# Patient Record
Sex: Male | Born: 1955 | Race: Black or African American | Hispanic: No | Smoking: Never smoker
Health system: Southern US, Community
[De-identification: ages and names within clinical notes are randomized; demographics above are authoritative.]

## PROBLEM LIST (undated history)

## (undated) DIAGNOSIS — I2510 Atherosclerotic heart disease of native coronary artery without angina pectoris: Secondary | ICD-10-CM

## (undated) DIAGNOSIS — Z8739 Personal history of other diseases of the musculoskeletal system and connective tissue: Secondary | ICD-10-CM

## (undated) DIAGNOSIS — G4730 Sleep apnea, unspecified: Secondary | ICD-10-CM

## (undated) DIAGNOSIS — T7840XA Allergy, unspecified, initial encounter: Secondary | ICD-10-CM

## (undated) DIAGNOSIS — Z87442 Personal history of urinary calculi: Secondary | ICD-10-CM

## (undated) DIAGNOSIS — I10 Essential (primary) hypertension: Secondary | ICD-10-CM

## (undated) DIAGNOSIS — H9191 Unspecified hearing loss, right ear: Secondary | ICD-10-CM

## (undated) DIAGNOSIS — I499 Cardiac arrhythmia, unspecified: Secondary | ICD-10-CM

## (undated) DIAGNOSIS — Z9289 Personal history of other medical treatment: Secondary | ICD-10-CM

## (undated) DIAGNOSIS — T829XXA Unspecified complication of cardiac and vascular prosthetic device, implant and graft, initial encounter: Secondary | ICD-10-CM

## (undated) DIAGNOSIS — H409 Unspecified glaucoma: Secondary | ICD-10-CM

## (undated) DIAGNOSIS — E119 Type 2 diabetes mellitus without complications: Secondary | ICD-10-CM

## (undated) DIAGNOSIS — E785 Hyperlipidemia, unspecified: Secondary | ICD-10-CM

## (undated) DIAGNOSIS — K219 Gastro-esophageal reflux disease without esophagitis: Secondary | ICD-10-CM

## (undated) HISTORY — DX: Hyperlipidemia, unspecified: E78.5

## (undated) HISTORY — DX: Sleep apnea, unspecified: G47.30

## (undated) HISTORY — DX: Personal history of other diseases of the musculoskeletal system and connective tissue: Z87.39

## (undated) HISTORY — DX: Unspecified glaucoma: H40.9

## (undated) HISTORY — DX: Allergy, unspecified, initial encounter: T78.40XA

## (undated) HISTORY — DX: Essential (primary) hypertension: I10

## (undated) HISTORY — DX: Atherosclerotic heart disease of native coronary artery without angina pectoris: I25.10

## (undated) HISTORY — DX: Unspecified complication of cardiac and vascular prosthetic device, implant and graft, initial encounter: T82.9XXA

## (undated) HISTORY — DX: Unspecified hearing loss, right ear: H91.91

## (undated) HISTORY — DX: Type 2 diabetes mellitus without complications: E11.9

## (undated) HISTORY — DX: Personal history of other medical treatment: Z92.89

## (undated) HISTORY — PX: CARDIAC CATHETERIZATION: SHX172

---

## 2004-12-30 ENCOUNTER — Ambulatory Visit: Payer: Self-pay | Admitting: Family Medicine

## 2010-03-24 HISTORY — PX: CAROTID STENT INSERTION: SHX5766

## 2012-05-03 DIAGNOSIS — R079 Chest pain, unspecified: Secondary | ICD-10-CM | POA: Insufficient documentation

## 2012-05-05 DIAGNOSIS — Z955 Presence of coronary angioplasty implant and graft: Secondary | ICD-10-CM | POA: Insufficient documentation

## 2015-01-05 ENCOUNTER — Ambulatory Visit (INDEPENDENT_AMBULATORY_CARE_PROVIDER_SITE_OTHER): Payer: BC Managed Care – PPO | Admitting: Family Medicine

## 2015-01-05 ENCOUNTER — Encounter: Payer: Self-pay | Admitting: Family Medicine

## 2015-01-05 VITALS — BP 122/60 | HR 104 | Temp 99.4°F | Resp 18 | Ht 75.5 in | Wt 276.2 lb

## 2015-01-05 DIAGNOSIS — Z9989 Dependence on other enabling machines and devices: Secondary | ICD-10-CM

## 2015-01-05 DIAGNOSIS — G4733 Obstructive sleep apnea (adult) (pediatric): Secondary | ICD-10-CM | POA: Diagnosis not present

## 2015-01-05 DIAGNOSIS — E785 Hyperlipidemia, unspecified: Secondary | ICD-10-CM | POA: Diagnosis not present

## 2015-01-05 DIAGNOSIS — E669 Obesity, unspecified: Secondary | ICD-10-CM | POA: Insufficient documentation

## 2015-01-05 DIAGNOSIS — Z955 Presence of coronary angioplasty implant and graft: Secondary | ICD-10-CM | POA: Insufficient documentation

## 2015-01-05 DIAGNOSIS — R0981 Nasal congestion: Secondary | ICD-10-CM | POA: Insufficient documentation

## 2015-01-05 DIAGNOSIS — I2510 Atherosclerotic heart disease of native coronary artery without angina pectoris: Secondary | ICD-10-CM | POA: Diagnosis not present

## 2015-01-05 DIAGNOSIS — E8881 Metabolic syndrome: Secondary | ICD-10-CM

## 2015-01-05 DIAGNOSIS — M79671 Pain in right foot: Secondary | ICD-10-CM

## 2015-01-05 DIAGNOSIS — I10 Essential (primary) hypertension: Secondary | ICD-10-CM | POA: Insufficient documentation

## 2015-01-05 NOTE — Progress Notes (Signed)
Name: Dean Sullivan   MRN: [id number]    DOB: [birth year]   Date:01/05/2015       Progress Note  Subjective  Chief Complaint  Chief Complaint  Patient presents with  . Establish Care  . Referral    patient needs to see a podiatrist for his bone spur.  . Medication Management    patient wants to talk changing his cholesterol medication.    HPI  Dean Sullivan is a 59 y.o. male here today to transition care of medical needs to a primary care provider.    CAD: Several years ago was light headed and dizziness, was seen at a hospital and eventually found to have atherosclerosis of coronaries (vessel names unknown) resulting in 2 stents being placed. Was on dual anticoagulation for 1 year and now is on ASA 81 MG alone. Denies chest pain, palpitations, LE swelling. Needs new cardiologist.  OSA: Diagnosed many years ago and is using CPAP nightly. Wondering if his breathing problems are correlated to sinus congesion.  HLD: Due to CAD his LDL goal is 100, it was last checked a few months ago it was 130s so his Crestor was increased from 5 mg to 20 mg but this is causing myalgia.   Foot pain: Many years of foot pain, waxing and waning. Right foot heal and 1st MTP plantar surface. Has consulted with podiatrist in the past for 2nd toe pain (buddy taping helped) but now that he is retired he notes symptoms someone subsiding. He was told to wear metatarsal pad but never found one.   Nasal congestion, chronic: Many years ago an ENT told him his nasal passage was nearly blocked and he may need surgery at some point. He reports and claritin and flonase did not help with his chronic sinus and nasal symptoms.   Trigger finger: Location right hand, 1st digit, locking, has had injection therapy previously and is requesting Orthopedic consultation to address this again.    Past Medical History  Diagnosis Date  . Hyperlipidemia   . Hypertension   . History of trigger finger     bilateral, but mostly  in right thumb  . Blockage of coronary artery bypass graft     patient stated that he had blockages - stents were inserted.  Marland Kitchen Hearing loss of right ear     patient was told he had slight hearing loss  . Coronary arteriosclerosis in native artery   . Allergy   . Sleep apnea     Patient Active Problem List   Diagnosis Date Noted  . Hyperlipidemia LDL goal <70 01/05/2015  . Coronary artery disease involving native coronary artery of native heart without angina pectoris 01/05/2015  . Status post coronary artery stent placement 01/05/2015  . Hypertension goal BP (blood pressure) < 140/90 01/05/2015  . Obesity, Class I, BMI 30.0-34.9 (see actual BMI) 01/05/2015  . Abdominal obesity-metabolic syndrome type 3 ([facility]) 01/05/2015  . Nasal sinus congestion 01/05/2015  . Right foot pain 01/05/2015  . Obstructive sleep apnea on CPAP 01/05/2015    Social History  Substance Use Topics  . Smoking status: Never Smoker   . Smokeless tobacco: Not on file  . Alcohol Use: 0.0 oz/week    0 Standard drinks or equivalent per week     Comment: a glass of wine      Current outpatient prescriptions:  .  aspirin 81 MG tablet, Take 81 mg by mouth daily., Disp: , Rfl:  .  Cholecalciferol (VITAMIN D-3) 1000 UNITS CAPS,  Take by mouth 3 (three) times daily., Disp: , Rfl:  .  losartan (COZAAR) 25 MG tablet, Take 25 mg by mouth daily., Disp: , Rfl:  .  Magnesium 250 MG TABS, Take by mouth., Disp: , Rfl:  .  Probiotic Product (PROBIOTIC ACIDOPHILUS) CAPS, Take by mouth., Disp: , Rfl:  .  rosuvastatin (CRESTOR) 20 MG tablet, Take 20 mg by mouth daily., Disp: , Rfl:  .  triamcinolone cream (KENALOG) 0.1 %, Apply 1 application topically 2 (two) times daily., Disp: , Rfl:   Past Surgical History  Procedure Laterality Date  . Carotid stent insertion  2012    2    Family History  Problem Relation Age of Onset  . Diabetes Mother     adult onset - geriatric  . Alcohol abuse Father   . Cancer Father      prostate  . Stroke Father     mini strokes  . Other Maternal Aunt     born with her lung outside of her body; lived until 45.  . Arthritis Maternal Grandmother     No Known Allergies   Review of Systems  CONSTITUTIONAL: No significant weight changes, fever, chills, weakness or fatigue.  HEENT:  - Eyes: No visual changes.  - Ears: No auditory changes. No pain.  - Nose: No sneezing, congestion, runny nose. - Throat: No sore throat. No changes in swallowing. SKIN: No rash or itching.  CARDIOVASCULAR: No chest pain, chest pressure or chest discomfort. No palpitations or edema.  RESPIRATORY: No shortness of breath, cough or sputum.  GASTROINTESTINAL: No anorexia, nausea, vomiting. No changes in bowel habits. No abdominal pain or blood.  GENITOURINARY: No dysuria. No frequency. No discharge.  NEUROLOGICAL: No headache, dizziness, syncope, paralysis, ataxia, numbness or tingling in the extremities. No memory changes. No change in bowel or bladder control.  MUSCULOSKELETAL: Yes joint pain. No muscle pain. HEMATOLOGIC: No anemia, bleeding or bruising.  LYMPHATICS: No enlarged lymph nodes.  PSYCHIATRIC: No change in mood. No change in sleep pattern.  ENDOCRINOLOGIC: No reports of sweating, cold or heat intolerance. No polyuria or polydipsia.     Objective  BP 122/60 mmHg  Pulse 104  Temp(Src) 99.4 F (37.4 C) (Oral)  Resp 18  Ht 6' 3.5" (1.918 m)  Wt 276 lb 3.2 oz (125.283 kg)  BMI 34.06 kg/m2  SpO2 97% Body mass index is 34.06 kg/(m^2).  Physical Exam  Constitutional: Patient is obese and well-nourished. In no distress.  HEENT:  - Head: Normocephalic and atraumatic.  - Ears: Bilateral TMs gray, no erythema or effusion - Nose: Nasal mucosa moist, boggy turbinates with narrow passage.  - Mouth/Throat: Oropharynx is clear and moist. No tonsillar hypertrophy or erythema. No post nasal drainage.  - Eyes: Conjunctivae clear, EOM movements normal. PERRLA. No scleral icterus.   Neck: Normal range of motion. Neck supple. No JVD present. No thyromegaly present.  Cardiovascular: Normal rate, regular rhythm and normal heart sounds.  No murmur heard.  Pulmonary/Chest: Effort normal and breath sounds normal. No respiratory distress. Abdominal circumference greater than 40 inches.  Musculoskeletal: Normal range of motion bilateral UE and LE, no joint effusions. Right foot callus over plantar MTP of 1st digit.  Peripheral vascular: Bilateral LE no edema. Neurological: CN II-XII grossly intact with no focal deficits. Alert and oriented to person, place, and time. Coordination, balance, strength, speech and gait are normal.  Skin: Skin is warm and dry. No rash noted. No erythema.  Psychiatric: Patient has a normal mood and  affect. Behavior is normal in office today. Judgment and thought content normal in office today.   Assessment & Plan  1. Hyperlipidemia LDL goal <70 Plan to use 1/2 tablet of Crestor 20 mg a day and if this helps resolve his myalgia call me to change his RX to Crestor 10 mg a day. Otherwise we will return to 5 mg a day and work on lifestyle changes more aggressively.   2. Coronary artery disease involving native coronary artery of native heart without angina pectoris Medication and lifestyle optimization.  - Ambulatory referral to Cardiology  3. Status post coronary artery stent placement  - Ambulatory referral to Cardiology  4. Obesity, Class I, BMI 30.0-34.9 (see actual BMI) The patient has been counseled on their higher than normal BMI.  They have verbally expressed understanding their increased risk for other diseases.  In efforts to meet a better target BMI goal the patient has been counseled on lifestyle, diet and exercise modification tactics. Start with moderate intensity aerobic exercise (walking, jogging, elliptical, swimming, group or individual sports, hiking) at least 77mins a day at least 4 days a week and increase intensity, duration,  frequency as tolerated. Diet should include well balance fresh fruits and vegetables avoiding processed foods, carbohydrates and sugars. Drink at least 8oz 10 glasses a day avoiding sodas, sugary fruit drinks, sweetened tea. Check weight on a reliable scale daily and monitor weight loss progress daily. Consider investing in mobile phone apps that will help keep track of weight loss goals.   5. Abdominal obesity-metabolic syndrome type 3 (HCC) Increased risk for cardiovascular and cerebral events.    6. Nasal sinus congestion Start Allegra daily.  - Ambulatory referral to ENT  7. Right foot pain Get metatarsal pad.  8. Obstructive sleep apnea on CPAP Continue CPAP machine.

## 2015-02-12 ENCOUNTER — Encounter: Payer: Self-pay | Admitting: Family Medicine

## 2015-02-12 ENCOUNTER — Ambulatory Visit
Admission: RE | Admit: 2015-02-12 | Discharge: 2015-02-12 | Disposition: A | Discharge status: Home / Self Care | Payer: BC Managed Care – PPO | Source: Ambulatory Visit | Attending: Family Medicine | Admitting: Family Medicine

## 2015-02-12 ENCOUNTER — Ambulatory Visit (INDEPENDENT_AMBULATORY_CARE_PROVIDER_SITE_OTHER): Payer: BC Managed Care – PPO | Admitting: Family Medicine

## 2015-02-12 VITALS — BP 118/70 | HR 100 | Temp 99.3°F | Resp 17 | Ht 76.0 in | Wt 277.8 lb

## 2015-02-12 DIAGNOSIS — R0789 Other chest pain: Secondary | ICD-10-CM

## 2015-02-12 DIAGNOSIS — R0989 Other specified symptoms and signs involving the circulatory and respiratory systems: Secondary | ICD-10-CM | POA: Insufficient documentation

## 2015-02-12 DIAGNOSIS — J020 Streptococcal pharyngitis: Secondary | ICD-10-CM

## 2015-02-12 DIAGNOSIS — J0110 Acute frontal sinusitis, unspecified: Secondary | ICD-10-CM | POA: Insufficient documentation

## 2015-02-12 LAB — POCT RAPID STREP A (OFFICE): Rapid Strep A Screen: POSITIVE — AB

## 2015-02-12 MED ORDER — AMOXICILLIN-POT CLAVULANATE 875-125 MG PO TABS: 1.0000 | ORAL_TABLET | Freq: Two times a day (BID) | ORAL | Status: DC

## 2015-02-12 NOTE — Progress Notes (Signed)
Name: Dean Sullivan   MRN: [id number]    DOB: [birth year]   Date:02/12/2015       Progress Note  Subjective  Chief Complaint  Chief Complaint  Patient presents with  . Acute Visit    tightness in throat and pressure in stomach x2 weeks    HPI  Pt. Is here complains of fatigue, throat congestion, and bloating for over 2 weeks. Symptoms persistent, seen by ENT and was given Afrin for nasal sinus congestion. He reports that for the past 2 weeks, he has noticed a multitude of GI and cardiopulmonary symptoms. He feels pressure in his chest and throat, gets 'winded' easily, has to constantly clear his throat, a sore throat, and has been running a low grade fever ( normal temp is 97.4 and todayy its 99.3).   Past Medical History  Diagnosis Date  . Hyperlipidemia   . Hypertension   . History of trigger finger     bilateral, but mostly in right thumb  . Blockage of coronary artery bypass graft     patient stated that he had blockages - stents were inserted.  Marland Kitchen Hearing loss of right ear     patient was told he had slight hearing loss  . Coronary arteriosclerosis in native artery   . Allergy   . Sleep apnea     Past Surgical History  Procedure Laterality Date  . Carotid stent insertion  2012    2    Family History  Problem Relation Age of Onset  . Diabetes Mother     adult onset - geriatric  . Alcohol abuse Father   . Cancer Father     prostate  . Stroke Father     mini strokes  . Other Maternal Aunt     born with her lung outside of her body; lived until 76.  . Arthritis Maternal Grandmother     Social History   Social History  . Marital Status: Unknown    Spouse Name: N/A  . Number of Children: N/A  . Years of Education: N/A   Occupational History  . Not on file.   Social History Main Topics  . Smoking status: Never Smoker   . Smokeless tobacco: Not on file  . Alcohol Use: 0.0 oz/week    0 Standard drinks or equivalent per week     Comment: a glass of wine    . Drug Use: No  . Sexual Activity:    Partners: Female   Other Topics Concern  . Not on file   Social History Narrative     Current outpatient prescriptions:  .  aspirin 81 MG tablet, Take 81 mg by mouth daily., Disp: , Rfl:  .  Cholecalciferol (VITAMIN D-3) 1000 UNITS CAPS, Take by mouth 3 (three) times daily., Disp: , Rfl:  .  losartan (COZAAR) 25 MG tablet, Take 25 mg by mouth daily., Disp: , Rfl:  .  Magnesium 250 MG TABS, Take by mouth., Disp: , Rfl:  .  Probiotic Product (PROBIOTIC ACIDOPHILUS) CAPS, Take by mouth., Disp: , Rfl:  .  rosuvastatin (CRESTOR) 20 MG tablet, Take 20 mg by mouth daily., Disp: , Rfl:  .  triamcinolone cream (KENALOG) 0.1 %, Apply 1 application topically 2 (two) times daily., Disp: , Rfl:   No Known Allergies   Review of Systems  Constitutional: Positive for fever, chills and malaise/fatigue.  HENT: Positive for sore throat (dryness in throat). Negative for congestion and ear pain.   Respiratory: Positive for  sputum production. Negative for cough, shortness of breath and wheezing.   Cardiovascular: Negative for chest pain (chest tightness/pressure) and palpitations.  Neurological: Negative for headaches.    Objective  Filed Vitals:   02/12/15 1452  BP: 118/70  Pulse: 100  Temp: 99.3 F (37.4 C)  TempSrc: Oral  Resp: 17  Height: 6\' 4"  (1.93 m)  Weight: 277 lb 12.8 oz (126.009 kg)  SpO2: 99%    Physical Exam  Constitutional: He is oriented to person, place, and time and well-developed, well-nourished, and in no distress.  HENT:  Right Ear: Tympanic membrane and ear canal normal.  Left Ear: Tympanic membrane and ear canal normal.  Nose: No mucosal edema. Right sinus exhibits frontal sinus tenderness. Right sinus exhibits no maxillary sinus tenderness. Left sinus exhibits frontal sinus tenderness. Left sinus exhibits no maxillary sinus tenderness.  Mouth/Throat: Oropharynx is clear and moist and mucous membranes are normal. No posterior  oropharyngeal erythema.  Cardiovascular: Normal rate, regular rhythm and normal heart sounds.   No murmur heard. Pulmonary/Chest: Effort normal and breath sounds normal. He has no wheezes.  Abdominal: Soft. Bowel sounds are normal. There is no tenderness.  Neurological: He is alert and oriented to person, place, and time.  Nursing note and vitals reviewed.   Assessment & Plan  1. Chest tightness or pressure Pain workup for chest tightness and pressure. EKG is normal sinus rhythm, no ST-T changes. Reviewed with patient. We will follow up on lab work and chest x-ray. - CBC with Differential - Comprehensive Metabolic Panel (CMET) - DG Chest 2 View; Future - EKG 12-Lead  2. Pharyngitis, streptococcal, acute Start on amoxicillin/clavulanate for treatment of streptococcal infection. Follow up with PCP. - amoxicillin-clavulanate (AUGMENTIN) 875-125 MG tablet; Take 1 tablet by mouth 2 (two) times daily.  Dispense: 20 tablet; Refill: 0 - POCT rapid strep A   Damein Gaunce [facility] 02/12/2015 3:20 PM

## 2015-02-13 ENCOUNTER — Telehealth: Payer: Self-pay | Admitting: Family Medicine

## 2015-02-13 DIAGNOSIS — J020 Streptococcal pharyngitis: Secondary | ICD-10-CM

## 2015-02-13 LAB — COMPREHENSIVE METABOLIC PANEL
A/G RATIO: 1.8 (ref 1.1–2.5)
ALBUMIN: 4.9 g/dL (ref 3.5–5.5)
ALT: 20 IU/L (ref 0–44)
AST: 17 IU/L (ref 0–40)
Alkaline Phosphatase: 96 IU/L (ref 39–117)
BUN / CREAT RATIO: 14 (ref 9–20)
BUN: 13 mg/dL (ref 6–24)
Bilirubin Total: 0.9 mg/dL (ref 0.0–1.2)
CALCIUM: 9.8 mg/dL (ref 8.7–10.2)
CO2: 23 mmol/L (ref 18–29)
Chloride: 100 mmol/L (ref 97–106)
Creatinine, Ser: 0.96 mg/dL (ref 0.76–1.27)
GFR, EST AFRICAN AMERICAN: 100 mL/min/{1.73_m2} (ref >59)
GFR, EST NON AFRICAN AMERICAN: 87 mL/min/{1.73_m2} (ref >59)
Globulin, Total: 2.7 g/dL (ref 1.5–4.5)
Glucose: 91 mg/dL (ref 65–99)
POTASSIUM: 4 mmol/L (ref 3.5–5.2)
Sodium: 140 mmol/L (ref 136–144)
TOTAL PROTEIN: 7.6 g/dL (ref 6.0–8.5)

## 2015-02-13 LAB — CBC WITH DIFFERENTIAL/PLATELET
BASOS: 0 %
Basophils Absolute: 0 10*3/uL (ref 0.0–0.2)
EOS (ABSOLUTE): 0.2 10*3/uL (ref 0.0–0.4)
EOS: 3 %
HEMATOCRIT: 38.6 % (ref 37.5–51.0)
HEMOGLOBIN: 13 g/dL (ref 12.6–17.7)
IMMATURE GRANS (ABS): 0 10*3/uL (ref 0.0–0.1)
IMMATURE GRANULOCYTES: 0 %
LYMPHS: 49 %
Lymphocytes Absolute: 3 10*3/uL (ref 0.7–3.1)
MCH: 27.3 pg (ref 26.6–33.0)
MCHC: 33.7 g/dL (ref 31.5–35.7)
MCV: 81 fL (ref 79–97)
MONOCYTES: 8 %
Monocytes Absolute: 0.5 10*3/uL (ref 0.1–0.9)
NEUTROS ABS: 2.4 10*3/uL (ref 1.4–7.0)
NEUTROS PCT: 40 %
PLATELETS: 258 10*3/uL (ref 150–379)
RBC: 4.76 x10E6/uL (ref 4.14–5.80)
RDW: 15 % (ref 12.3–15.4)
WBC: 6.1 10*3/uL (ref 3.4–10.8)

## 2015-02-13 MED ORDER — AMOXICILLIN-POT CLAVULANATE 875-125 MG PO TABS: 1.0000 | ORAL_TABLET | Freq: Two times a day (BID) | ORAL | Status: DC

## 2015-02-13 NOTE — Telephone Encounter (Signed)
Was seen yesterday and was prescribed an antibotic. The medication will cost him $54.00 would like to know if there is something less expensive that you could call in that is equivalent to the medication prescribed? Or a generic form

## 2015-02-13 NOTE — Telephone Encounter (Signed)
Generic amoxicillin-clavulanate 875-125 mg 1 by mouth twice a day #20 sent to pharmacy

## 2015-02-13 NOTE — Telephone Encounter (Signed)
Routed to Dr. Manuella Ghazi for medication advice

## 2015-02-19 ENCOUNTER — Telehealth: Payer: Self-pay | Admitting: Family Medicine

## 2015-02-19 NOTE — Telephone Encounter (Signed)
Pt needs to get referral done for his Trigger Point. Pt states his symptoms are worsening

## 2015-02-20 ENCOUNTER — Other Ambulatory Visit: Payer: Self-pay | Admitting: Family Medicine

## 2015-02-20 DIAGNOSIS — M6530 Trigger finger, unspecified finger: Secondary | ICD-10-CM

## 2015-02-20 NOTE — Telephone Encounter (Signed)
Referral to [facility] placed.

## 2015-02-20 NOTE — Telephone Encounter (Signed)
Pt informed of referral and will will for call.

## 2015-03-05 ENCOUNTER — Ambulatory Visit (INDEPENDENT_AMBULATORY_CARE_PROVIDER_SITE_OTHER): Payer: BC Managed Care – PPO | Admitting: Cardiovascular Disease

## 2015-03-05 ENCOUNTER — Encounter: Payer: Self-pay | Admitting: Cardiovascular Disease

## 2015-03-05 VITALS — BP 130/80 | HR 73 | Ht 76.0 in | Wt 278.5 lb

## 2015-03-05 DIAGNOSIS — E785 Hyperlipidemia, unspecified: Secondary | ICD-10-CM

## 2015-03-05 DIAGNOSIS — Z79899 Other long term (current) drug therapy: Secondary | ICD-10-CM | POA: Diagnosis not present

## 2015-03-05 DIAGNOSIS — R0789 Other chest pain: Secondary | ICD-10-CM | POA: Diagnosis not present

## 2015-03-05 DIAGNOSIS — E8881 Metabolic syndrome: Secondary | ICD-10-CM

## 2015-03-05 DIAGNOSIS — I10 Essential (primary) hypertension: Secondary | ICD-10-CM

## 2015-03-05 DIAGNOSIS — E669 Obesity, unspecified: Secondary | ICD-10-CM

## 2015-03-05 DIAGNOSIS — Z9989 Dependence on other enabling machines and devices: Secondary | ICD-10-CM

## 2015-03-05 DIAGNOSIS — I2510 Atherosclerotic heart disease of native coronary artery without angina pectoris: Secondary | ICD-10-CM | POA: Diagnosis not present

## 2015-03-05 DIAGNOSIS — G4733 Obstructive sleep apnea (adult) (pediatric): Secondary | ICD-10-CM

## 2015-03-05 DIAGNOSIS — J020 Streptococcal pharyngitis: Secondary | ICD-10-CM

## 2015-03-05 MED ORDER — EZETIMIBE 10 MG PO TABS: 10.0000 mg | ORAL_TABLET | Freq: Every day | ORAL | Status: DC

## 2015-03-05 NOTE — Assessment & Plan Note (Signed)
He reports that he is compliant with his CPAP. He may need to establish with pulmonary sleep physician

## 2015-03-05 NOTE — Assessment & Plan Note (Signed)
Fullness in his chest and shortness of breath is improving as strep. Pharyngitis is complete. Recommended we hold off on any testing at this time but that he call us if symptoms do not improve back to his baseline. Currently he feels he is doing better

## 2015-03-05 NOTE — Assessment & Plan Note (Signed)
Symptoms improved as he has held his probiotics which caused significant bloating. Also after treatment of his strep pharyngitis Encouraged him to call us if symptoms recur

## 2015-03-05 NOTE — Assessment & Plan Note (Signed)
Blood pressure is well controlled on today's visit. No changes made to the medications. 

## 2015-03-05 NOTE — Assessment & Plan Note (Signed)
We have encouraged continued exercise, careful diet management in an effort to lose weight. 

## 2015-03-05 NOTE — Assessment & Plan Note (Signed)
Goal LDL less than 70 Most recent LDL 130 on low-dose Crestor, we'll repeat lipids today on Crestor 10 mg daily Suspect we'll need to add zetia. Even then do not think he will reach goal, likely will need praluent or repatha sq injectio

## 2015-03-05 NOTE — Patient Instructions (Addendum)
You are doing well.  Goal total cholesterol is <150 Goal LDL is <70  We will check cholesterol today  We will send in zetia one a day for choelsterol  Please call us if you have new issues that need to be addressed before your next appt.  Your physician wants you to follow-up in: 6 months.  You will receive a reminder letter in the mail two months in advance. If you don't receive a letter, please call our office to schedule the follow-up appointment.

## 2015-03-05 NOTE — Assessment & Plan Note (Signed)
Symptoms improved on Augmentin Still with some fatigue

## 2015-03-05 NOTE — Progress Notes (Signed)
Patient ID: Dean Sullivan, male    DOB: September 30, 1955, 59 y.o.   MRN: [id number]  HPI Comments: Dean Sullivan is a pleasant 59 year old gentleman with history of coronary artery disease, stent 2 in the past, hyperlipidemia, obstructive sleep apnea on CPAP, presenting for symptoms of chest discomfort.   Recently seen your no stroke for sinus congestion, reports he had a treatment/spray, open up everything in his posterior pharynx Shortly after, possibly 3-4 days, started developing sore throat, chest fullness seenby primary care, diagnosed with strep pharyngitis, treated with Augmentin Symptoms have dramatically improved Felt bloated in the abdomen, radiating up into his chest at nighttime when he lays down Stopped his probiotics and symptoms seem to be getting better Still with mild fatigue, stopped antibiotic last week. Slowly feeling better Mild shortness of breath on exertion  Notes indicate LDL 130 on Crestor 5 mg daily Increased up to 20 mg daily but had muscle ache, decreased back to 10 mg daily which she is tolerating Previously was on WelChol but he had side effects  Reports having 2 stents placed either in 2012 or 2013 at hospital in [place]. Results not available but have been requested. Otherwise active, no regular exercise program, weight has been trending upwards and retirement  EKG reviewed from primary care showing normal sinus rhythm with no significant ST or T-wave changes  No Known Allergies  Current Outpatient Prescriptions on File Prior to Visit  Medication Sig Dispense Refill  . aspirin 81 MG tablet Take 81 mg by mouth daily.    Marland Kitchen losartan (COZAAR) 25 MG tablet Take 25 mg by mouth daily.    . Magnesium 250 MG TABS Take by mouth.    . rosuvastatin (CRESTOR) 20 MG tablet Take 10 mg by mouth daily.     Marland Kitchen triamcinolone cream (KENALOG) 0.1 % Apply 1 application topically 2 (two) times daily.     No current facility-administered medications on file prior to visit.     Past Medical History  Diagnosis Date  . Hyperlipidemia   . Hypertension   . History of trigger finger     bilateral, but mostly in right thumb  . Blockage of coronary artery bypass graft     patient stated that he had blockages - stents were inserted.  Marland Kitchen Hearing loss of right ear     patient was told he had slight hearing loss  . Coronary arteriosclerosis in native artery   . Allergy   . Sleep apnea     Past Surgical History  Procedure Laterality Date  . Cardiac catheterization      x2 stents [place] History  reports that he has never smoked. He does not have any smokeless tobacco history on file. He reports that he drinks alcohol. He reports that he does not use illicit drugs.  Family History family history includes Alcohol abuse in his father; Arthritis in his maternal grandmother; Cancer in his father; Diabetes in his mother; Heart Problems in his father; Other in his maternal aunt; Stroke in his father.   Review of Systems  Constitutional: Negative.   Respiratory: Positive for shortness of breath.   Cardiovascular: Negative.   Gastrointestinal: Negative.   Musculoskeletal: Negative.   Neurological: Negative.   Hematological: Negative.   Psychiatric/Behavioral: Negative.   All other systems reviewed and are negative.  BP 130/80 mmHg  Pulse 73  Ht 6\' 4"  (1.93 m)  Wt 278 lb 8 oz (126.327 kg)  BMI 33.91 kg/m2  Physical Exam  Constitutional: He is oriented to person, place, and time. He appears well-developed and well-nourished.  obese  HENT:  Head: Normocephalic.  Nose: Nose normal.  Mouth/Throat: Oropharynx is clear and moist.  Eyes: Conjunctivae are normal. Pupils are equal, round, and reactive to light.  Neck: Normal range of motion. Neck supple. No JVD present.  Cardiovascular: Normal rate, regular rhythm, normal heart sounds and intact distal pulses.  Exam reveals no gallop and no friction rub.   No murmur  heard. Pulmonary/Chest: Effort normal and breath sounds normal. No respiratory distress. He has no wheezes. He has no rales. He exhibits no tenderness.  Abdominal: Soft. Bowel sounds are normal. He exhibits no distension. There is no tenderness.  Musculoskeletal: Normal range of motion. He exhibits no edema or tenderness.  Lymphadenopathy:    He has no cervical adenopathy.  Neurological: He is alert and oriented to person, place, and time. Coordination normal.  Skin: Skin is warm and dry. No rash noted. No erythema.  Psychiatric: He has a normal mood and affect. His behavior is normal. Judgment and thought content normal.

## 2015-03-06 LAB — LIPID PANEL
CHOLESTEROL TOTAL: 158 mg/dL (ref 100–199)
Chol/HDL Ratio: 4.5 ratio units (ref 0.0–5.0)
HDL: 35 mg/dL — ABNORMAL LOW (ref >39)
LDL CALC: 95 mg/dL (ref 0–99)
TRIGLYCERIDES: 138 mg/dL (ref 0–149)
VLDL Cholesterol Cal: 28 mg/dL (ref 5–40)

## 2015-04-24 ENCOUNTER — Ambulatory Visit
Admission: EM | Admit: 2015-04-24 | Discharge: 2015-04-24 | Disposition: A | Discharge status: Home / Self Care | Payer: BC Managed Care – PPO | Attending: Family Medicine | Admitting: Family Medicine

## 2015-04-24 ENCOUNTER — Encounter: Payer: Self-pay | Admitting: Emergency Medicine

## 2015-04-24 DIAGNOSIS — R0981 Nasal congestion: Secondary | ICD-10-CM | POA: Diagnosis not present

## 2015-04-24 DIAGNOSIS — J020 Streptococcal pharyngitis: Secondary | ICD-10-CM

## 2015-04-24 DIAGNOSIS — R040 Epistaxis: Secondary | ICD-10-CM | POA: Diagnosis not present

## 2015-04-24 MED ORDER — MONTELUKAST SODIUM 10 MG PO TABS: 10.0000 mg | ORAL_TABLET | Freq: Every day | ORAL | Status: DC

## 2015-04-24 MED ORDER — LORATADINE-PSEUDOEPHEDRINE ER 10-240 MG PO TB24: 1.0000 | ORAL_TABLET | Freq: Every day | ORAL | Status: DC

## 2015-04-24 NOTE — ED Notes (Signed)
Pt presents with nose bleed this am when blowing his nose. Denies any other sx at this time. No bleeding noted at this time.

## 2015-04-24 NOTE — Discharge Instructions (Signed)
Nosebleed  Nosebleeds are common. A nosebleed can be caused by many things, including:  · Getting hit hard in the nose.  · Infections.  · Dryness in your nose.  · A dry climate.  · Medicines.  · Picking your nose.  · Your home heating and cooling systems.  HOME CARE   · Try controlling your nosebleed by pinching your nostrils gently. Do this for at least 10 minutes.  · Avoid blowing or sniffing your nose for a number of hours after having a nosebleed.  · Do not put gauze inside of your nose yourself. If your nose was packed by your doctor, try to keep the pack inside of your nose until your doctor removes it.    If a gauze pack was used and it starts to fall out, gently replace it or cut off the end of it.    If a balloon catheter was used to pack your nose, do not cut or remove it unless told by your doctor.  · Avoid lying down while you are having a nosebleed. Sit up and lean forward.  · Use a nasal spray decongestant to help with a nosebleed as told by your doctor.  · Do not use petroleum jelly or mineral oil in your nose. These can drip into your lungs.  · Keep your house humid by using:    Less air conditioning.    A humidifier.  · Aspirin and blood thinners make bleeding more likely. If you are prescribed these medicines and you have nosebleeds, ask your doctor if you should stop taking the medicines or adjust the dose. Do not stop medicines unless told by your doctor.  · Resume your normal activities as you are able. Avoid straining, lifting, or bending at your waist for several days.  · If your nosebleed was caused by dryness in your nose, use over-the-counter saline nasal spray or gel. If you must use a lubricant:    Choose one that is water-soluble.    Use it only as needed.    Do not use it within several hours of lying down.  · Keep all follow-up visits as told by your doctor. This is important.  GET HELP IF:  · You have a fever.  · You get frequent nosebleeds.  · You are getting nosebleeds more  often.  GET HELP RIGHT AWAY IF:  · Your nosebleed lasts longer than 20 minutes.  · Your nosebleed occurs after an injury to your face, and your nose looks crooked or broken.  · You have unusual bleeding from other parts of your body.  · You have unusual bruising on other parts of your body.  · You feel light-headed or dizzy.  · You become sweaty.  · You throw up (vomit) blood.  · You have a nosebleed after a head injury.     This information is not intended to replace advice given to you by your health care provider. Make sure you discuss any questions you have with your health care provider.     Document Released: 12/18/2007 Document Revised: 03/31/2014 Document Reviewed: 10/24/2013  [employer]vier Interactive Patient Education ©2016 [employer]

## 2015-04-24 NOTE — ED Provider Notes (Signed)
CSN: [id number]     Arrival date & time 04/24/15  1227 History   First MD Initiated Contact with Patient 04/24/15 1255    Nurses notes were reviewed. Chief Complaint  Patient presents with  . Epistaxis   Patient is retired African-American male who states that he has history of chronic nasal congestion but this morning he had episodes some mild bleeding from his right nostril min bleeding from his left nostril. No easy have nosebleeds and we recurred and became concerned and came in to be seen and evaluated.  History of strep throat about about a month ago history hypertension hyperlipidemia and history of angina  Family history mother with diabetes and father cancer and alcohol abuse  Patient never smoked states he was told that he had a deviated septum opted not to have surgery because the cost. He has a CPAP machine uses that E pot and uses either Allegra or Claritin on a regular basis.    (Consider location/radiation/quality/duration/timing/severity/associated sxs/prior Treatment) HPI  Past Medical History  Diagnosis Date  . Hyperlipidemia   . Hypertension   . History of trigger finger     bilateral, but mostly in right thumb  . Blockage of coronary artery bypass graft     patient stated that he had blockages - stents were inserted.  Marland Kitchen Hearing loss of right ear     patient was told he had slight hearing loss  . Coronary arteriosclerosis in native artery   . Allergy   . Sleep apnea    Past Surgical History  Procedure Laterality Date  . Carotid stent insertion  2012    2  . Cardiac catheterization      x2 stents [facility]   Family History  Problem Relation Age of Onset  . Diabetes Mother     adult onset - geriatric  . Alcohol abuse Father   . Cancer Father     prostate  . Stroke Father     mini strokes  . Heart Problems Father   . Other Maternal Aunt     born with her lung outside of her body; lived until 69.  . Arthritis Maternal Grandmother     Social History  Substance Use Topics  . Smoking status: Never Smoker   . Smokeless tobacco: None  . Alcohol Use: 0.0 oz/week    0 Standard drinks or equivalent per week     Comment: a glass of wine     Review of Systems  Allergies  Review of patient's allergies indicates no known allergies.  Home Medications   Prior to Admission medications   Medication Sig Start Date End Date Taking? Authorizing Provider  aspirin 81 MG tablet Take 81 mg by mouth daily.    Historical Provider, MD  Cholecalciferol (VITAMIN D) 2000 UNITS tablet Take 4,000 Units by mouth daily.    Historical Provider, MD  ezetimibe (ZETIA) 10 MG tablet Take 1 tablet (10 mg total) by mouth daily. 03/05/15   Minna Merritts, MD  loratadine-pseudoephedrine (CLARITIN-D 24 HOUR) 10-240 MG 24 hr tablet Take 1 tablet by mouth daily. Not take with Allegra or Claritin plain 04/24/15   Frederich Cha, MD  losartan (COZAAR) 25 MG tablet Take 25 mg by mouth daily.    Historical Provider, MD  Magnesium 250 MG TABS Take by mouth.    Historical Provider, MD  montelukast (SINGULAIR) 10 MG tablet Take 1 tablet (10 mg total) by mouth at bedtime. 04/24/15   Frederich Cha, MD  Omeprazole (  PRILOSEC PO) Take by mouth daily.    Historical Provider, MD  rosuvastatin (CRESTOR) 20 MG tablet Take 10 mg by mouth daily.     Historical Provider, MD  triamcinolone cream (KENALOG) 0.1 % Apply 1 application topically 2 (two) times daily.    Historical Provider, MD   Meds Ordered and Administered this Visit  Medications - No data to display  BP 144/91 mmHg  Pulse 77  Temp(Src) 98.8 F (37.1 C) (Tympanic)  Resp 20  Ht 6\' 3"  (1.905 m)  Wt 277 lb (125.646 kg)  BMI 34.62 kg/m2  SpO2 100% No data found.   Physical Exam  Constitutional: He is oriented to person, place, and time. He appears well-developed and well-nourished. No distress.  HENT:  Head: Normocephalic and atraumatic.  Right Ear: Hearing, tympanic membrane, external ear and ear canal  normal.  Left Ear: Hearing, tympanic membrane, external ear and ear canal normal.  Nose: Epistaxis is observed. Right sinus exhibits no maxillary sinus tenderness. Left sinus exhibits no maxillary sinus tenderness.  Mouth/Throat: Uvula is midline, oropharynx is clear and moist and mucous membranes are normal.  Patient has multiple areas of bleeding on the left nostril and left nasal mucosa oriented A1 to areas of the right.  Eyes: Pupils are equal, round, and reactive to light.  Neck: Normal range of motion. Neck supple.  Musculoskeletal: Normal range of motion.  Neurological: He is alert and oriented to person, place, and time.  Skin: Skin is warm and dry. He is not diaphoretic. No erythema.  Nursing note and vitals reviewed.   ED Course  .Epistaxis Management Date/Time: 04/24/2015 1:48 PM Performed by: Frederich Cha Authorized by: Frederich Cha Consent: Verbal consent obtained. Patient sedated: no Treatment site: right anterior and left anterior Repair method: silver nitrate Post-procedure assessment: bleeding stopped Patient tolerance: Patient tolerated the procedure well with no immediate complications Comments: Patient tolerated well the use of silver nitrate to stop the bleeding both nostrils several passes were made in the left nostril 0.1 needed in the right nostril   (including critical care time)  Labs Review Labs Reviewed - No data to display  Imaging Review No results found.   Visual Acuity Review  Right Eye Distance:   Left Eye Distance:   Bilateral Distance:    Right Eye Near:   Left Eye Near:    Bilateral Near:         MDM   1. Acute anterior epistaxis   2. Chronic nasal congestion    Patient will be given Claritin-D 24 hours 1 tablet daily recommend that over-the-counter Claritin and Allegra plain. We'll place him on Singulair 10 mg 1 tablet daily. I recommend they continue using his steroid nasal sprays of his choice. He is not using it though the  recommend way of chin on chest and alternating hand and nose sprays i.e. he is right-hand spray left nostril left hand spread right nostril and putting chin on his chest. Follow-up PCP if not better the patient is doing well time discharge  Note: This dictation was prepared with Dragon dictation along with smaller phrase technology. Any transcriptional errors that result from this process are unintentional.   Frederich Cha, MD 04/24/15 1352

## 2015-06-04 ENCOUNTER — Other Ambulatory Visit: Payer: Self-pay

## 2015-06-04 MED ORDER — ROSUVASTATIN CALCIUM 20 MG PO TABS: 10.0000 mg | ORAL_TABLET | Freq: Every day | ORAL | Status: DC

## 2015-09-06 ENCOUNTER — Ambulatory Visit (INDEPENDENT_AMBULATORY_CARE_PROVIDER_SITE_OTHER): Payer: BC Managed Care – PPO | Admitting: Cardiovascular Disease

## 2015-09-06 ENCOUNTER — Encounter: Payer: Self-pay | Admitting: Cardiovascular Disease

## 2015-09-06 VITALS — BP 140/86 | HR 79 | Ht 75.0 in | Wt 278.1 lb

## 2015-09-06 DIAGNOSIS — Z9989 Dependence on other enabling machines and devices: Secondary | ICD-10-CM

## 2015-09-06 DIAGNOSIS — E669 Obesity, unspecified: Secondary | ICD-10-CM

## 2015-09-06 DIAGNOSIS — E785 Hyperlipidemia, unspecified: Secondary | ICD-10-CM

## 2015-09-06 DIAGNOSIS — E66811 Obesity, class 1: Secondary | ICD-10-CM

## 2015-09-06 DIAGNOSIS — I2510 Atherosclerotic heart disease of native coronary artery without angina pectoris: Secondary | ICD-10-CM | POA: Diagnosis not present

## 2015-09-06 DIAGNOSIS — G4733 Obstructive sleep apnea (adult) (pediatric): Secondary | ICD-10-CM

## 2015-09-06 DIAGNOSIS — Z955 Presence of coronary angioplasty implant and graft: Secondary | ICD-10-CM | POA: Diagnosis not present

## 2015-09-06 DIAGNOSIS — I10 Essential (primary) hypertension: Secondary | ICD-10-CM | POA: Diagnosis not present

## 2015-09-06 MED ORDER — EZETIMIBE 10 MG PO TABS: 10.0000 mg | ORAL_TABLET | Freq: Every day | ORAL | Status: DC

## 2015-09-06 NOTE — Progress Notes (Signed)
Patient ID: Dean Sullivan, male   DOB: 01-Sep-1955, 60 y.o.   MRN: [id number] Cardiology Office Note  Date:  09/06/2015   ID:  Dean Sullivan, DOB [birth year], MRN [id number]  PCP:  Bobetta Lime, MD   Chief Complaint  Patient presents with  . Coronary Artery Disease    some belching with heartburn, per pt    HPI:  Dean Sullivan is a pleasant 60 year old gentleman with history of coronary artery disease, stent 2 in the past, hyperlipidemia, obstructive sleep apnea on CPAP, presenting for Routine follow-up of his coronary artery disease   prior cardiac catheterization showing obstructive disease in his RCA and LAD, stent placed to each vessel    in follow-up, he is working out at the gym trying to lose weight On his last clinic visit we recommended that he start zetia, she has not done this yet Continues to take Crestor 10 mg daily. He had myalgias on 20 mg Previously had side effects on WelChol  Denies having any shortness of breath or chest pain on exertion  EKG on today's visit shows normal sinus rhythm with rate 76 bpm, T-wave abnormality anterolateral leads, inferior leads   no recent lab work Prior total cholesterol 150s, LDL 90s    other past medical history reviewed Reports having 2 stents placed either in 2012 or 2013 at hospital in [place].   PMH:   has a past medical history of Hyperlipidemia; Hypertension; History of trigger finger; Blockage of coronary artery bypass graft; Hearing loss of right ear; Coronary arteriosclerosis in native artery; Allergy; and Sleep apnea.  PSH:    Past Surgical History  Procedure Laterality Date  . Carotid stent insertion  2012    2  . Cardiac catheterization      x2 stents [facility]    Current Outpatient Prescriptions  Medication Sig Dispense Refill  . aspirin 81 MG tablet Take 81 mg by mouth daily.    . Cholecalciferol (VITAMIN D) 2000 UNITS tablet Take 2,000 Units by mouth daily.     Marland Kitchen losartan (COZAAR) 25 MG tablet  Take 25 mg by mouth daily.    . Magnesium 250 MG TABS Take by mouth.    . rosuvastatin (CRESTOR) 20 MG tablet Take 0.5 tablets (10 mg total) by mouth at bedtime. 90 tablet 1   No current facility-administered medications for this visit.     Allergies:   Review of patient's allergies indicates no known allergies.   Social History:  The patient  reports that he has never smoked. He does not have any smokeless tobacco history on file. He reports that he drinks alcohol. He reports that he does not use illicit drugs.   Family History:   family history includes Alcohol abuse in his father; Arthritis in his maternal grandmother; Cancer in his father; Diabetes in his mother; Heart Problems in his father; Other in his maternal aunt; Stroke in his father.    Review of Systems: Review of Systems  Constitutional: Negative.   Respiratory: Negative.   Cardiovascular: Negative.   Gastrointestinal: Negative.   Musculoskeletal: Negative.   Neurological: Negative.   Psychiatric/Behavioral: Negative.   All other systems reviewed and are negative.    PHYSICAL EXAM: VS:  BP 140/86 mmHg  Pulse 79  Ht 6\' 3"  (1.905 m)  Wt 278 lb 1.9 oz (126.154 kg)  BMI 34.76 kg/m2  SpO2 96% , BMI Body mass index is 34.76 kg/(m^2). GEN: Well nourished, well developed, in no acute distress HEENT: normal Neck: no JVD,  carotid bruits, or masses Cardiac: RRR; no murmurs, rubs, or gallops,no edema  Respiratory:  clear to auscultation bilaterally, normal work of breathing GI: soft, nontender, nondistended, + BS MS: no deformity or atrophy Skin: warm and dry, no rash Neuro:  Strength and sensation are intact Psych: euthymic mood, full affect    Recent Labs: 02/12/2015: ALT 20; BUN 13; Creatinine, Ser 0.96; Platelets 258; Potassium 4.0; Sodium 140    Lipid Panel Lab Results  Component Value Date   CHOL 158 03/05/2015   HDL 35* 03/05/2015   LDLCALC 95 03/05/2015   TRIG 138 03/05/2015      Wt Readings  from Last 3 Encounters:  09/06/15 278 lb 1.9 oz (126.154 kg)  04/24/15 277 lb (125.646 kg)  03/05/15 278 lb 8 oz (126.327 kg)       ASSESSMENT AND PLAN:  Coronary artery disease involving native coronary artery of native heart without angina pectoris - Currently with no symptoms of angina. No further workup at this time. Continue current medication regimen.  Hypertension goal BP (blood pressure) < 140/90 Blood pressure is well controlled on today's visit. No changes made to the medications.  Hyperlipidemia LDL goal <70 Recommended that he continue Crestor 10 mg daily, start zetia 10 mg daily Repeat lipid panel in 6 months  Status post coronary artery stent placement Denies any symptoms concerning for angina or stent restenosis We'll continue aspirin, aggressive cholesterol management  Obesity, Class I, BMI 30.0-34.9 (see actual BMI) We have encouraged continued exercise, careful diet management in an effort to lose weight.  Obstructive sleep apnea on CPAP    Total encounter time more than 15 minutes  Greater than 50% was spent in counseling and coordination of care with the patient    Disposition:   F/U  12 months   Orders Placed This Encounter  Procedures  . EKG 12-Lead     Signed, Esmond Plants, M.D., Ph.D. 09/06/2015  [place], [place]

## 2015-09-06 NOTE — Patient Instructions (Addendum)
You are doing well.  Please start zetia one a day Repeat lipids in 6 months  Co ENZ Q10 for cramping  Please call us if you have new issues that need to be addressed before your next appt.  Your physician wants you to follow-up in: 12 months.  You will receive a reminder letter in the mail two months in advance. If you don't receive a letter, please call our office to schedule the follow-up appointment.

## 2015-11-13 ENCOUNTER — Other Ambulatory Visit: Payer: Self-pay

## 2015-11-13 MED ORDER — LOSARTAN POTASSIUM 25 MG PO TABS: 25.0000 mg | ORAL_TABLET | Freq: Every day | ORAL | 2 refills | Status: DC

## 2015-11-13 NOTE — Telephone Encounter (Signed)
Reviewed Dr. Donivan Scull note from June 2017; last creatinine per his note normal at 0.96; last K+ was 4 Rx approved

## 2016-02-08 ENCOUNTER — Telehealth: Payer: Self-pay | Admitting: Family Medicine

## 2016-02-08 NOTE — Telephone Encounter (Signed)
Left voice message.

## 2016-02-08 NOTE — Telephone Encounter (Signed)
Patient has not been seen here in over a year; please ask him to schedule a visit; thank you I'll approve one refill for 30 days, but we'll need to see him and get labs for further refills; thank you

## 2016-03-04 ENCOUNTER — Ambulatory Visit: Payer: BC Managed Care – PPO | Admitting: Family Medicine

## 2016-03-07 ENCOUNTER — Ambulatory Visit: Payer: BC Managed Care – PPO | Admitting: Family Medicine

## 2016-03-12 ENCOUNTER — Encounter: Payer: Self-pay | Admitting: Family Medicine

## 2016-03-12 ENCOUNTER — Ambulatory Visit (INDEPENDENT_AMBULATORY_CARE_PROVIDER_SITE_OTHER): Payer: BC Managed Care – PPO | Admitting: Family Medicine

## 2016-03-12 DIAGNOSIS — E6609 Other obesity due to excess calories: Secondary | ICD-10-CM

## 2016-03-12 DIAGNOSIS — I10 Essential (primary) hypertension: Secondary | ICD-10-CM

## 2016-03-12 DIAGNOSIS — Z6835 Body mass index (BMI) 35.0-35.9, adult: Secondary | ICD-10-CM

## 2016-03-12 DIAGNOSIS — Z5181 Encounter for therapeutic drug level monitoring: Secondary | ICD-10-CM

## 2016-03-12 DIAGNOSIS — Z955 Presence of coronary angioplasty implant and graft: Secondary | ICD-10-CM | POA: Diagnosis not present

## 2016-03-12 DIAGNOSIS — IMO0001 Reserved for inherently not codable concepts without codable children: Secondary | ICD-10-CM

## 2016-03-12 DIAGNOSIS — E785 Hyperlipidemia, unspecified: Secondary | ICD-10-CM | POA: Diagnosis not present

## 2016-03-12 LAB — CBC WITH DIFFERENTIAL/PLATELET
BASOS PCT: 0 %
Basophils Absolute: 0 cells/uL (ref 0–200)
Eosinophils Absolute: 220 cells/uL (ref 15–500)
Eosinophils Relative: 4 %
HEMATOCRIT: 40.3 % (ref 38.5–50.0)
HEMOGLOBIN: 13.1 g/dL — AB (ref 13.2–17.1)
LYMPHS ABS: 2860 {cells}/uL (ref 850–3900)
Lymphocytes Relative: 52 %
MCH: 27.6 pg (ref 27.0–33.0)
MCHC: 32.5 g/dL (ref 32.0–36.0)
MCV: 85 fL (ref 80.0–100.0)
MONO ABS: 385 {cells}/uL (ref 200–950)
MPV: 9.6 fL (ref 7.5–12.5)
Monocytes Relative: 7 %
Neutro Abs: 2035 cells/uL (ref 1500–7800)
Neutrophils Relative %: 37 %
Platelets: 246 10*3/uL (ref 140–400)
RBC: 4.74 MIL/uL (ref 4.20–5.80)
RDW: 15.1 % — ABNORMAL HIGH (ref 11.0–15.0)
WBC: 5.5 10*3/uL (ref 3.8–10.8)

## 2016-03-12 LAB — COMPLETE METABOLIC PANEL WITH GFR
ALT: 18 U/L (ref 9–46)
AST: 18 U/L (ref 10–35)
Albumin: 4.4 g/dL (ref 3.6–5.1)
Alkaline Phosphatase: 85 U/L (ref 40–115)
BUN: 12 mg/dL (ref 7–25)
CALCIUM: 9.4 mg/dL (ref 8.6–10.3)
CHLORIDE: 106 mmol/L (ref 98–110)
CO2: 22 mmol/L (ref 20–31)
Creat: 0.99 mg/dL (ref 0.70–1.33)
GFR, Est Non African American: 83 mL/min (ref >=60)
Glucose, Bld: 105 mg/dL — ABNORMAL HIGH (ref 65–99)
POTASSIUM: 3.9 mmol/L (ref 3.5–5.3)
Sodium: 140 mmol/L (ref 135–146)
Total Bilirubin: 1 mg/dL (ref 0.2–1.2)
Total Protein: 7.2 g/dL (ref 6.1–8.1)

## 2016-03-12 LAB — LIPID PANEL
CHOL/HDL RATIO: 4.2 ratio (ref <5.0)
CHOLESTEROL: 142 mg/dL (ref <200)
HDL: 34 mg/dL — ABNORMAL LOW (ref >40)
LDL Cholesterol: 83 mg/dL (ref <100)
Triglycerides: 124 mg/dL (ref <150)
VLDL: 25 mg/dL (ref <30)

## 2016-03-12 MED ORDER — LOSARTAN POTASSIUM 25 MG PO TABS: 25.0000 mg | ORAL_TABLET | Freq: Every day | ORAL | 3 refills | Status: DC

## 2016-03-12 MED ORDER — ROSUVASTATIN CALCIUM 20 MG PO TABS: 10.0000 mg | ORAL_TABLET | Freq: Every day | ORAL | 3 refills | Status: DC

## 2016-03-12 NOTE — Patient Instructions (Addendum)
Your goal blood pressure is less than 140 mmHg on top. Try to follow the DASH guidelines (DASH stands for Dietary Approaches to Stop Hypertension) Try to limit the sodium in your diet.  Ideally, consume less than 1.5 grams (less than 1,500mg ) per day. Do not add salt when cooking or at the table.  Check the sodium amount on labels when shopping, and choose items lower in sodium when given a choice. Avoid or limit foods that already contain a lot of sodium. Eat a diet rich in fruits and vegetables and whole grains.  DASH Eating Plan DASH stands for "Dietary Approaches to Stop Hypertension." The DASH eating plan is a healthy eating plan that has been shown to reduce high blood pressure (hypertension). Additional health benefits may include reducing the risk of type 2 diabetes mellitus, heart disease, and stroke. The DASH eating plan may also help with weight loss. What do I need to know about the DASH eating plan? For the DASH eating plan, you will follow these general guidelines:  Choose foods with less than 150 milligrams of sodium per serving (as listed on the food label).  Use salt-free seasonings or herbs instead of table salt or sea salt.  Check with your health care provider or pharmacist before using salt substitutes.  Eat lower-sodium products. These are often labeled as "low-sodium" or "no salt added."  Eat fresh foods. Avoid eating a lot of canned foods.  Eat more vegetables, fruits, and low-fat dairy products.  Choose whole grains. Look for the word "whole" as the first word in the ingredient list.  Choose fish and skinless chicken or [place] more often than red meat. Limit fish, poultry, and meat to 6 oz (170 g) each day.  Limit sweets, desserts, sugars, and sugary drinks.  Choose heart-healthy fats.  Eat more home-cooked food and less restaurant, buffet, and fast food.  Limit fried foods.  Do not fry foods. Cook foods using methods such as baking, boiling, grilling, and  broiling instead.  When eating at a restaurant, ask that your food be prepared with less salt, or no salt if possible. What foods can I eat? Seek help from a dietitian for individual calorie needs. Grains  Whole grain or whole wheat bread. Brown rice. Whole grain or whole wheat pasta. Quinoa, bulgur, and whole grain cereals. Low-sodium cereals. Corn or whole wheat flour tortillas. Whole grain cornbread. Whole grain crackers. Low-sodium crackers. Vegetables  Fresh or frozen vegetables (raw, steamed, roasted, or grilled). Low-sodium or reduced-sodium tomato and vegetable juices. Low-sodium or reduced-sodium tomato sauce and paste. Low-sodium or reduced-sodium canned vegetables. Fruits  All fresh, canned (in natural juice), or frozen fruits. Meat and Other Protein Products  Ground beef (85% or leaner), grass-fed beef, or beef trimmed of fat. Skinless chicken or [place]. Ground chicken or [place]. Pork trimmed of fat. All fish and seafood. Eggs. Dried beans, peas, or lentils. Unsalted nuts and seeds. Unsalted canned beans. Dairy  Low-fat dairy products, such as skim or 1% milk, 2% or reduced-fat cheeses, low-fat ricotta or cottage cheese, or plain low-fat yogurt. Low-sodium or reduced-sodium cheeses. Fats and Oils  Tub margarines without trans fats. Light or reduced-fat mayonnaise and salad dressings (reduced sodium). Avocado. Safflower, olive, or canola oils. Natural peanut or almond butter. Other  Unsalted popcorn and pretzels. The items listed above may not be a complete list of recommended foods or beverages. Contact your dietitian for more options.  What foods are not recommended? Grains  White bread. White pasta. White rice.  Refined cornbread. Bagels and croissants. Crackers that contain trans fat. Vegetables  Creamed or fried vegetables. Vegetables in a cheese sauce. Regular canned vegetables. Regular canned tomato sauce and paste. Regular tomato and vegetable juices. Fruits  Canned fruit  in light or heavy syrup. Fruit juice. Meat and Other Protein Products  Fatty cuts of meat. Ribs, chicken wings, bacon, sausage, bologna, salami, chitterlings, fatback, hot dogs, bratwurst, and packaged luncheon meats. Salted nuts and seeds. Canned beans with salt. Dairy  Whole or 2% milk, cream, half-and-half, and cream cheese. Whole-fat or sweetened yogurt. Full-fat cheeses or blue cheese. Nondairy creamers and whipped toppings. Processed cheese, cheese spreads, or cheese curds. Condiments  Onion and garlic salt, seasoned salt, table salt, and sea salt. Canned and packaged gravies. Worcestershire sauce. Tartar sauce. Barbecue sauce. Teriyaki sauce. Soy sauce, including reduced sodium. Steak sauce. Fish sauce. Oyster sauce. Cocktail sauce. Horseradish. Ketchup and mustard. Meat flavorings and tenderizers. Bouillon cubes. Hot sauce. Tabasco sauce. Marinades. Taco seasonings. Relishes. Fats and Oils  Butter, stick margarine, lard, shortening, ghee, and bacon fat. Coconut, palm kernel, or palm oils. Regular salad dressings. Other  Pickles and olives. Salted popcorn and pretzels. The items listed above may not be a complete list of foods and beverages to avoid. Contact your dietitian for more information.  Where can I find more information? National Heart, Lung, and Blood Institute: travelstabloid.com This information is not intended to replace advice given to you by your health care provider. Make sure you discuss any questions you have with your health care provider. Document Released: 02/27/2011 Document Revised: 08/16/2015 Document Reviewed: 01/12/2013 Elsevier Interactive Patient Education  2017 [employer].
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
## 2016-03-12 NOTE — Assessment & Plan Note (Signed)
Controlled; try to folow DASH diet; work on weight loss

## 2016-03-12 NOTE — Assessment & Plan Note (Signed)
Check labs 

## 2016-03-12 NOTE — Assessment & Plan Note (Signed)
Working on it.

## 2016-03-12 NOTE — Assessment & Plan Note (Signed)
Managed by Dr. Rockey Situ; continue aspirin; work on weight loss

## 2016-03-12 NOTE — Assessment & Plan Note (Signed)
Check lipids today; goal LDL is less than 70 

## 2016-03-27 ENCOUNTER — Other Ambulatory Visit: Payer: Self-pay

## 2016-03-27 DIAGNOSIS — D649 Anemia, unspecified: Secondary | ICD-10-CM

## 2016-03-27 LAB — POC HEMOCCULT BLD/STL (HOME/3-CARD/SCREEN)
Card #2 Fecal Occult Blod, POC: NEGATIVE
Card #3 Fecal Occult Blood, POC: NEGATIVE
FECAL OCCULT BLD: NEGATIVE

## 2016-06-17 IMAGING — CR DG CHEST 2V
1 series · 2 of 2 positions shown · non-contrast
Comparison: None.

CLINICAL DATA: Chest pressure and congestion.

EXAM:
CHEST  2 VIEW

[Series 1: dg chest 2 view · 0.14mm/px · 2 of 2 slices shown]
[im 1/2]
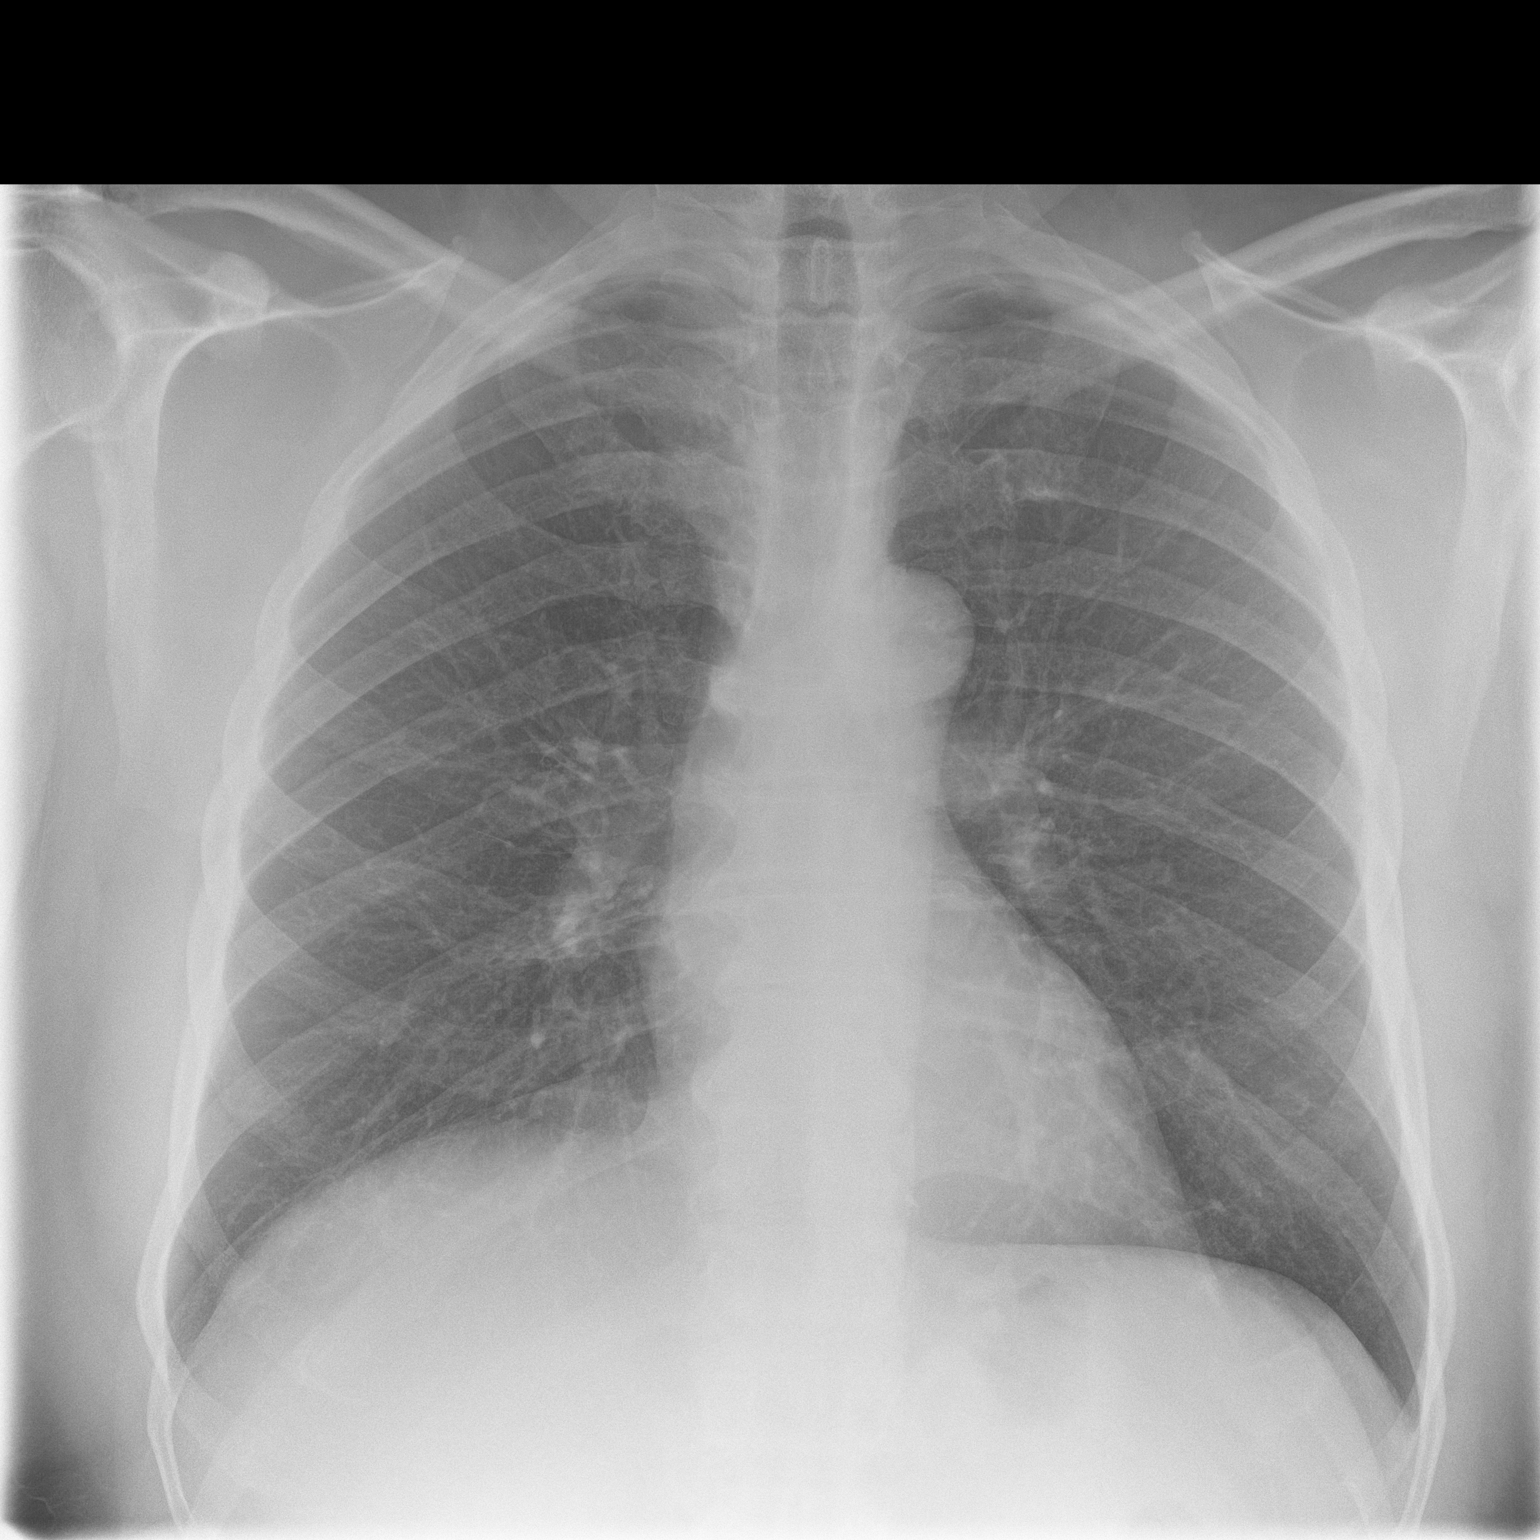
[im 2/2]
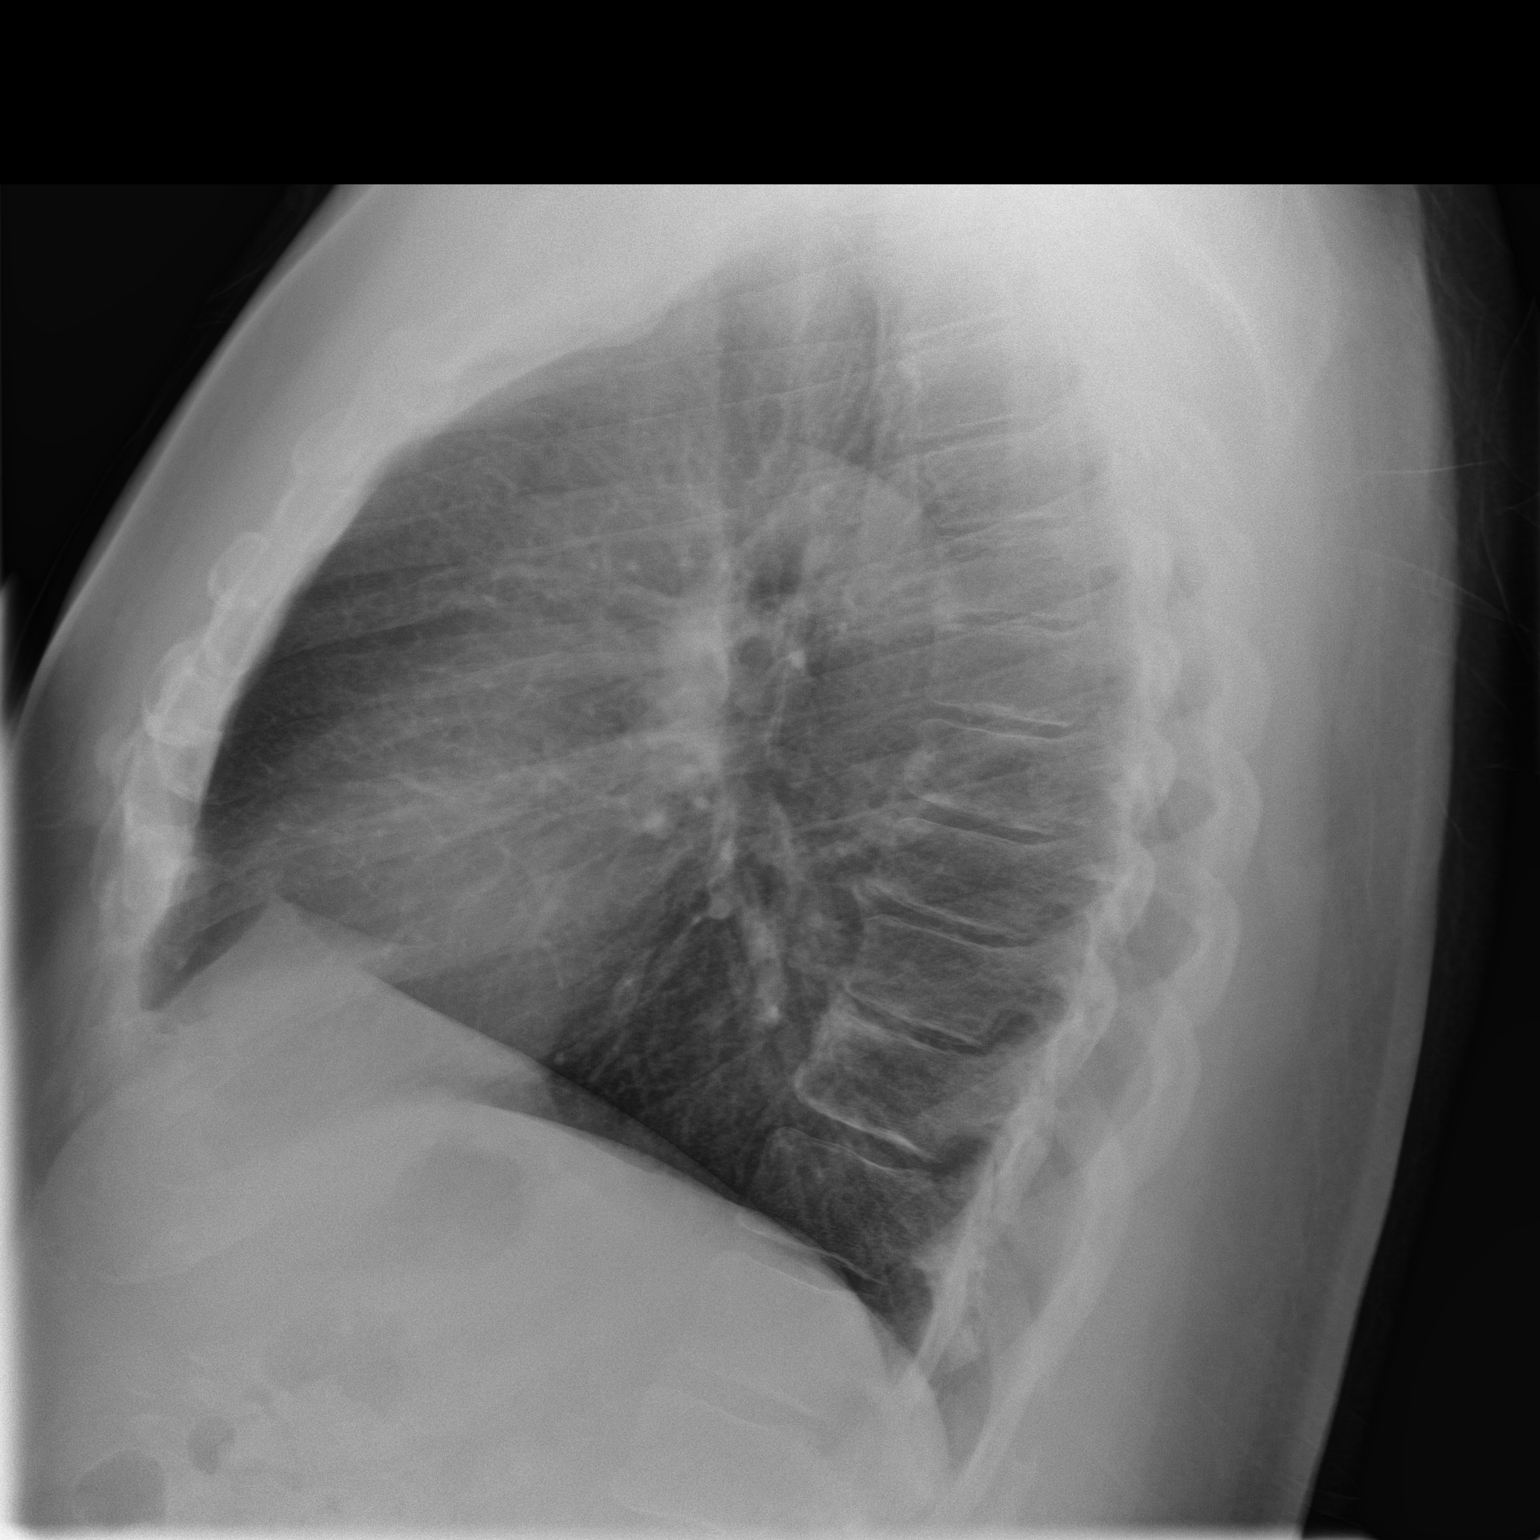

[2 of 2 positions shown; findings below may reference images not displayed]

FINDINGS: The heart size and mediastinal contours are within normal limits.
Both lungs are clear. No evidence of pneumothorax or pleural
effusion. Mild thoracic spine degenerative changes noted.
IMPRESSION: No active cardiopulmonary disease.

## 2016-08-07 ENCOUNTER — Telehealth: Payer: Self-pay | Admitting: Family Medicine

## 2016-08-07 NOTE — Telephone Encounter (Signed)
We received a note from patient's insurance company that he is using meloxicam He has a hx of coronary artery disease We'll encourage him to talk to his heart doctor about the safety of meloxicam with his medical conditions/history Thank you

## 2016-08-07 NOTE — Telephone Encounter (Signed)
Left voice mail

## 2016-09-04 NOTE — Progress Notes (Signed)
Patient ID: Dean Sullivan, male   DOB: 1955/10/27, 61 y.o.   MRN: [id number] Cardiology Office Note  Date:  09/05/2016   ID:  Dean Sullivan, DOB 1955-12-15, MRN [id number]  PCP:  Arnetha Courser, MD   Chief Complaint  Patient presents with  . other    12 month follow up. Meds reviewed by the pt. verbally. "doing well."     HPI:  Dean Sullivan is a pleasant 61 year old gentleman with history of  coronary artery disease,   prior cardiac catheterization showing obstructive disease in his RCA and LAD, stent placed to each vessel  hyperlipidemia,  obstructive sleep apnea on CPAP,  presenting for Routine follow-up of his coronary artery disease   In follow-up he reports that he is doing well with no complaints Some shoulder discomfort, arthritic pain in the left shoulder, muscles in his back, Continues to take Crestor 10 mg daily. He had myalgias on 20 mg Did not tolerate Zetia Previously had side effects on WelChol  Denies having any shortness of breath or chest pain on exertion No regular exercise regimen  EKG on today's visit shows normal sinus rhythm with rate 87 bpm, T-wave abnormality anterolateral leads, inferior leads, PVCs   no recent lab work Prior total cholesterol 140, LDL 80s    other past medical history reviewed Reports having 2 stents placed either in 2012 or 2013 at hospital in [place].   PMH:   has a past medical history of Allergy; Blockage of coronary artery bypass graft; Coronary arteriosclerosis in native artery; Hearing loss of right ear; History of trigger finger; Hyperlipidemia; Hypertension; and Sleep apnea.  PSH:    Past Surgical History:  Procedure Laterality Date  . CARDIAC CATHETERIZATION     x2 stents [place]  2012   2    Current Outpatient Prescriptions  Medication Sig Dispense Refill  . aspirin 81 MG tablet Take 81 mg by mouth daily.    . Cholecalciferol (VITAMIN D) 2000 UNITS tablet Take 1,000 Units  by mouth daily.     Marland Kitchen latanoprost (XALATAN) 0.005 % ophthalmic solution Place 1 drop into both eyes at bedtime.     Marland Kitchen losartan (COZAAR) 25 MG tablet Take 1 tablet (25 mg total) by mouth daily. 90 tablet 3  . meloxicam (MOBIC) 15 MG tablet Take 15 mg by mouth daily.     . rosuvastatin (CRESTOR) 20 MG tablet Take 0.5 tablets (10 mg total) by mouth at bedtime. 45 tablet 3   No current facility-administered medications for this visit.      Allergies:   Patient has no known allergies.   Social History:  The patient  reports that he has never smoked. He has never used smokeless tobacco. He reports that he drinks alcohol. He reports that he does not use drugs.   Family History:   family history includes Alcohol abuse in his father; Arthritis in his maternal grandmother; Cancer in his father; Diabetes in his mother; Heart Problems in his father; Other in his maternal aunt; Stroke in his father.    Review of Systems: Review of Systems  Constitutional: Negative.   Respiratory: Negative.   Cardiovascular: Negative.   Gastrointestinal: Negative.   Musculoskeletal: Positive for joint pain.  Neurological: Negative.   Psychiatric/Behavioral: Negative.   All other systems reviewed and are negative.    PHYSICAL EXAM: VS:  BP (!) 150/90 (BP Location: Left Arm, Patient Position: Sitting, Cuff Size: Normal)   Pulse 74   Ht  6' 3.5" (1.918 m)   Wt 281 lb 4 oz (127.6 kg)   BMI 34.69 kg/m  , BMI Body mass index is 34.69 kg/m. GEN: Well nourished, well developed, in no acute distress HEENT: normal Neck: no JVD, carotid bruits, or masses Cardiac: RRR; no murmurs, rubs, or gallops,no edema  Respiratory:  clear to auscultation bilaterally, normal work of breathing GI: soft, nontender, nondistended, + BS MS: no deformity or atrophy Skin: warm and dry, no rash Neuro:  Strength and sensation are intact Psych: euthymic mood, full affect    Recent Labs: 03/12/2016: ALT 18; BUN 12; Creat 0.99;  Hemoglobin 13.1; Platelets 246; Potassium 3.9; Sodium 140    Lipid Panel Lab Results  Component Value Date   CHOL 142 03/12/2016   HDL 34 (L) 03/12/2016   LDLCALC 83 03/12/2016   TRIG 124 03/12/2016      Wt Readings from Last 3 Encounters:  09/05/16 281 lb 4 oz (127.6 kg)  03/12/16 281 lb (127.5 kg)  09/06/15 278 lb 1.9 oz (126.2 kg)       ASSESSMENT AND PLAN:  Coronary artery disease involving native coronary artery of native heart without angina pectoris - Currently with no symptoms of angina. No further workup at this time. Continue current medication regimen.  Hypertension goal BP (blood pressure) < 140/90 Initial blood pressure elevated  We recommended he monitor his blood pressure at home, increase losartan up to 50 mg daily only if systolic pressure consistently more than 140 For now he will cut the 50 mg pill in half  Hyperlipidemia LDL goal <70 Recommended that he continue Crestor 10 mg daily,  Did not tolerate Zetia  Repeat lipid panel today   Status post coronary artery stent placement Denies any symptoms concerning for angina or stent restenosis We'll continue aspirin, aggressive cholesterol management  Obesity, Class I, BMI 30.0-34.9 (see actual BMI) We have encouraged continued exercise, careful diet management in an effort to lose weight.  Obstructive sleep apnea on CPAP    Total encounter time more than 25 minutes  Greater than 50% was spent in counseling and coordination of care with the patient    Disposition:   F/U  12 months   No orders of the defined types were placed in this encounter.    Signed, Esmond Plants, M.D., Ph.D. 09/05/2016  [place], [place]

## 2016-09-05 ENCOUNTER — Encounter: Payer: Self-pay | Admitting: Cardiovascular Disease

## 2016-09-05 ENCOUNTER — Ambulatory Visit (INDEPENDENT_AMBULATORY_CARE_PROVIDER_SITE_OTHER): Payer: BC Managed Care – PPO | Admitting: Cardiovascular Disease

## 2016-09-05 VITALS — BP 150/90 | HR 74 | Ht 75.5 in | Wt 281.2 lb

## 2016-09-05 DIAGNOSIS — E785 Hyperlipidemia, unspecified: Secondary | ICD-10-CM | POA: Diagnosis not present

## 2016-09-05 DIAGNOSIS — Z9989 Dependence on other enabling machines and devices: Secondary | ICD-10-CM | POA: Diagnosis not present

## 2016-09-05 DIAGNOSIS — I10 Essential (primary) hypertension: Secondary | ICD-10-CM | POA: Diagnosis not present

## 2016-09-05 DIAGNOSIS — G4733 Obstructive sleep apnea (adult) (pediatric): Secondary | ICD-10-CM | POA: Diagnosis not present

## 2016-09-05 DIAGNOSIS — I2510 Atherosclerotic heart disease of native coronary artery without angina pectoris: Secondary | ICD-10-CM | POA: Diagnosis not present

## 2016-09-05 MED ORDER — ROSUVASTATIN CALCIUM 20 MG PO TABS: 20.0000 mg | ORAL_TABLET | Freq: Every day | ORAL | 3 refills | Status: DC

## 2016-09-05 MED ORDER — SILDENAFIL CITRATE 20 MG PO TABS: 20.0000 mg | ORAL_TABLET | Freq: Three times a day (TID) | ORAL | 3 refills | Status: DC | PRN

## 2016-09-05 MED ORDER — LOSARTAN POTASSIUM 50 MG PO TABS: 50.0000 mg | ORAL_TABLET | Freq: Every day | ORAL | 3 refills | Status: DC

## 2016-09-05 NOTE — Patient Instructions (Addendum)
Medication Instructions:   No medication changes made  Labwork:  We will check lipids and liver today  Testing/Procedures:  No further testing at this time   I recommend watching educational videos on topics of interest to you at:       [email or web address].com  Enter code: HEARTCARE    Follow-Up: It was a pleasure seeing you in the office today. Please call us if you have new issues that need to be addressed before your next appt.  [phone]  Your physician wants you to follow-up in: 12 months.  You will receive a reminder letter in the mail two months in advance. If you don't receive a letter, please call our office to schedule the follow-up appointment.  If you need a refill on your cardiac medications before your next appointment, please call your pharmacy.

## 2016-09-06 LAB — HEPATIC FUNCTION PANEL
ALBUMIN: 4.7 g/dL (ref 3.5–5.5)
ALT: 21 IU/L (ref 0–44)
AST: 19 IU/L (ref 0–40)
Alkaline Phosphatase: 88 IU/L (ref 39–117)
Bilirubin Total: 0.6 mg/dL (ref 0.0–1.2)
Bilirubin, Direct: 0.14 mg/dL (ref 0.00–0.40)
TOTAL PROTEIN: 7.5 g/dL (ref 6.0–8.5)

## 2016-09-06 LAB — LIPID PANEL
CHOL/HDL RATIO: 4.6 ratio (ref 0.0–5.0)
Cholesterol, Total: 158 mg/dL (ref 100–199)
HDL: 34 mg/dL — AB (ref >39)
LDL CALC: 98 mg/dL (ref 0–99)
TRIGLYCERIDES: 130 mg/dL (ref 0–149)
VLDL CHOLESTEROL CAL: 26 mg/dL (ref 5–40)

## 2016-09-10 ENCOUNTER — Ambulatory Visit (INDEPENDENT_AMBULATORY_CARE_PROVIDER_SITE_OTHER): Payer: BC Managed Care – PPO | Admitting: Family Medicine

## 2016-09-10 ENCOUNTER — Encounter: Payer: Self-pay | Admitting: Family Medicine

## 2016-09-10 VITALS — BP 144/72 | HR 88 | Temp 98.6°F | Resp 14 | Wt 275.4 lb

## 2016-09-10 DIAGNOSIS — R739 Hyperglycemia, unspecified: Secondary | ICD-10-CM

## 2016-09-10 DIAGNOSIS — R7303 Prediabetes: Secondary | ICD-10-CM | POA: Diagnosis not present

## 2016-09-10 DIAGNOSIS — I2510 Atherosclerotic heart disease of native coronary artery without angina pectoris: Secondary | ICD-10-CM

## 2016-09-10 DIAGNOSIS — Z125 Encounter for screening for malignant neoplasm of prostate: Secondary | ICD-10-CM | POA: Insufficient documentation

## 2016-09-10 DIAGNOSIS — E785 Hyperlipidemia, unspecified: Secondary | ICD-10-CM

## 2016-09-10 DIAGNOSIS — I10 Essential (primary) hypertension: Secondary | ICD-10-CM

## 2016-09-10 LAB — POCT GLYCOSYLATED HEMOGLOBIN (HGB A1C): Hemoglobin A1C: 6.3

## 2016-09-10 NOTE — Progress Notes (Signed)
BP (!) 144/72   Pulse 88   Temp 98.6 F (37 C) (Oral)   Resp 14   Wt 275 lb 6.4 oz (124.9 kg)   SpO2 98%   BMI 33.97 kg/m    Subjective:    Patient ID: Dean Sullivan, male    DOB: [birth year], 61 y.o.   MRN: [id number]  HPI: Dean Sullivan is a 61 y.o. male  Chief Complaint  Patient presents with  . Follow-up    6 months    HPI Patient is here for f/u; just had labs on Friday through cardiologist; low HDL, only 34; cardiologist told him to exercise more; does chicken breast, trying to watch his diet; will try to cut back on bacon and ham; does not do sausage patties; hamburger once a month; down to half of a 20 mg pill now per cardiologist  LEFT shoulder 4-5 weeks; doing meloxicam (started that on right thumb trigger finger); trouble  RIGHT forearm pain; was flying stunt kites and pulled on the strings  Saw cardiologist, BP was higher at his visit, maybe from pain; taking 50 mg for two nights, then feels tired and sleepy; BP will range from 142-160; started the new higher dose two days  Mother got diabetes at age 40, strong family hx on both sides; was prediabetic in the 1990's, lost weight and it came back down; lost 40 some pounds; what   Bowels are loose most of the time; ice cream does it; usually number 5 or 6; lots of veggies and fruits; apples and bananas; no s/s of hyperthyroidism; drinks almond milk  Father had prostate cancer; 1x nocturia, good strong stream; has OSA, wearing CPAP  Depression screen [facility] 2/9 09/10/2016 03/12/2016 02/12/2015 01/05/2015  Decreased Interest 0 0 0 0  Down, Depressed, Hopeless 0 0 0 0  PHQ - 2 Score 0 0 0 0    Relevant past medical, surgical, family and social history reviewed Past Medical History:  Diagnosis Date  . Allergy   . Blockage of coronary artery bypass graft    patient stated that he had blockages - stents were inserted.  . Coronary arteriosclerosis in native artery   . Glaucoma   . Hearing loss of right ear    patient was told he had slight hearing loss  . History of trigger finger    bilateral, but mostly in right thumb  . Hyperlipidemia   . Hypertension   . Prediabetes 09/10/2016  . Sleep apnea    Past Surgical History:  Procedure Laterality Date  . CARDIAC CATHETERIZATION     x2 stents [place]  2012   2   Family History  Problem Relation Age of Onset  . Diabetes Mother        adult onset - geriatric  . Alcohol abuse Father   . Cancer Father        prostate  . Stroke Father        mini strokes  . Heart Problems Father   . Other Maternal Aunt        born with her lung outside of her body; lived until 48.  . Arthritis Maternal Grandmother    Social History   Social History  . Marital status: Divorced    Spouse name: N/A  . Number of children: N/A  . Years of education: N/A   Occupational History  . Not on file.   Social History Main Topics  . Smoking status: Never Smoker  .  Smokeless tobacco: Never Used  . Alcohol use 0.0 oz/week     Comment: a glass of wine   . Drug use: No  . Sexual activity: Yes    Partners: Female   Other Topics Concern  . Not on file   Social History Narrative  . No narrative on file    Interim medical history since last visit reviewed. Allergies and medications reviewed  Review of Systems Per HPI unless specifically indicated above     Objective:    BP (!) 144/72   Pulse 88   Temp 98.6 F (37 C) (Oral)   Resp 14   Wt 275 lb 6.4 oz (124.9 kg)   SpO2 98%   BMI 33.97 kg/m   Wt Readings from Last 3 Encounters:  09/10/16 275 lb 6.4 oz (124.9 kg)  09/05/16 281 lb 4 oz (127.6 kg)  03/12/16 281 lb (127.5 kg)    Physical Exam  Constitutional: He appears well-developed and well-nourished. No distress.  Obese, weight loss noted  HENT:  Head: Normocephalic and atraumatic.  Eyes: EOM are normal. No scleral icterus.  Neck: No thyromegaly present.  Cardiovascular: Normal rate and regular  rhythm.   Pulmonary/Chest: Effort normal and breath sounds normal.  Abdominal: Soft. Bowel sounds are normal. He exhibits no distension.  Musculoskeletal: He exhibits no edema.       Left shoulder: He exhibits decreased range of motion.       Right wrist: He exhibits tenderness and swelling.  Neurological: Coordination normal.  Skin: Skin is warm and dry. No pallor.  Psychiatric: He has a normal mood and affect. His behavior is normal. Judgment and thought content normal. His mood appears not anxious. He does not exhibit a depressed mood.    Results for orders placed or performed in visit on 09/10/16  POCT HgB A1C  Result Value Ref Range   Hemoglobin A1C 6.3       Assessment & Plan:   Problem List Items Addressed This Visit      Cardiovascular and Mediastinum   Hypertension goal BP (blood pressure) < 140/90    Contact cardiologist about symptoms now on the 50 mg of ARB; DASH guidelines and weight loss will help      Coronary artery disease involving native coronary artery of native heart without angina pectoris    Asymptomatic; seeing cardiologist; weight loss, BP control, etc important; continue aspirin        Other   Screening for prostate cancer    With father having had prostate cancer late 60's, will check PSA today      Relevant Orders   PSA   Prediabetes    Encouraged weight loss and healthy eating; monitor glucose and A1c every 6 months; patient wishes to do lifestyle change instead of medicine      Hyperlipidemia LDL goal <70    Goal LDL less than 70; limit saturated fats; weight loss       Other Visit Diagnoses    Hyperglycemia    -  Primary   noted on prior labs; check in-house A1c today   Relevant Orders   POCT HgB A1C (Completed)      Follow up plan: Return in about 6 months (around 03/12/2017) for twenty minute follow-up with fasting labs.  An after-visit summary was printed and given to the patient at [place].  Please see the patient  instructions which may contain other information and recommendations beyond what is mentioned above in the assessment and plan.  No orders of the defined types were placed in this encounter.   Orders Placed This Encounter  Procedures  . PSA  . POCT HgB A1C

## 2016-09-10 NOTE — Assessment & Plan Note (Signed)
With father having had prostate cancer late 60's, will check PSA today

## 2016-09-10 NOTE — Assessment & Plan Note (Signed)
Encouraged weight loss and healthy eating; monitor glucose and A1c every 6 months; patient wishes to do lifestyle change instead of medicine

## 2016-09-10 NOTE — Assessment & Plan Note (Signed)
Contact cardiologist about symptoms now on the 50 mg of ARB; DASH guidelines and weight loss will help

## 2016-09-10 NOTE — Assessment & Plan Note (Signed)
Asymptomatic; seeing cardiologist; weight loss, BP control, etc important; continue aspirin

## 2016-09-10 NOTE — Assessment & Plan Note (Addendum)
Goal LDL less than 70; limit saturated fats; weight loss

## 2016-09-10 NOTE — Patient Instructions (Addendum)
Try to follow the DASH guidelines (DASH stands for Dietary Approaches to Stop Hypertension) Try to limit the sodium in your diet.  Ideally, consume less than 1.5 grams (less than 1,500mg ) per day. Do not add salt when cooking or at the table.  Check the sodium amount on labels when shopping, and choose items lower in sodium when given a choice. Avoid or limit foods that already contain a lot of sodium. Eat a diet rich in fruits and vegetables and whole grains.  Try to limit saturated fats in your diet (bologna, hot dogs, barbeque, cheeseburgers, hamburgers, steak, bacon, sausage, cheese, etc.) and get more fresh fruits, vegetables, and whole grains  Check out the information at familydoctor.org entitled "Nutrition for Weight Loss: What You Need to Know about Fad Diets" Try to lose between 1-2 pounds per week by taking in fewer calories and burning off more calories You can succeed by limiting portions, limiting foods dense in calories and fat, becoming more active, and drinking at least 8 glasses of water a day (64 ounces) Don't skip meals, especially breakfast, as skipping meals may alter your metabolism Do not use over-the-counter weight loss pills or gimmicks that claim rapid weight loss A healthy BMI (or body mass index) is between 18.5 and 24.9 You can calculate your ideal BMI at the [place] website ClubMonetize.fr  For one month, avoid ALL cow's milk / dairy products For the second month, avoid ALL gluten If still having symptoms, let me know and we do testing for celiac or food allergies

## 2016-09-11 ENCOUNTER — Telehealth: Payer: Self-pay | Admitting: Cardiovascular Disease

## 2016-09-11 DIAGNOSIS — M6740 Ganglion, unspecified site: Secondary | ICD-10-CM | POA: Insufficient documentation

## 2016-09-11 DIAGNOSIS — M654 Radial styloid tenosynovitis [de Quervain]: Secondary | ICD-10-CM | POA: Insufficient documentation

## 2016-09-11 DIAGNOSIS — M7542 Impingement syndrome of left shoulder: Secondary | ICD-10-CM | POA: Insufficient documentation

## 2016-09-11 LAB — PSA: PSA: 0.8 ng/mL (ref <=4.0)

## 2016-09-11 NOTE — Telephone Encounter (Signed)
Pt calling stating the increased BP medication that he was taking was making him very dizzy and tired all the sudden  His BP would stay around the 140 range he states  He went to PCP and they told him yesterday to call us and get some advise on this  He did go back to the 25 mg of Losartan   Please call back

## 2016-09-11 NOTE — Telephone Encounter (Signed)
Spoke w/ pt.  He reports that his BP was elevated at last ov, so his losartan was increased to 50 mg daily. He took this dose for 3 nights and reports dizziness and fatigue. He did not check his BP while symptomatic, as she takes pill in the evening and just went to bed. He BP has remained 140s/80s when he checks during the day, regardless of losartan dose. His PCP advised him that elevated pressures may have been from meloxicam and zyrtec. He has resumed losartan 25 mg dose and would like to make Dr. Rockey Situ aware. Advised him to continue to monitor BP.

## 2016-09-22 ENCOUNTER — Other Ambulatory Visit: Payer: Self-pay

## 2016-09-22 DIAGNOSIS — Z6835 Body mass index (BMI) 35.0-35.9, adult: Secondary | ICD-10-CM

## 2016-09-22 DIAGNOSIS — Z955 Presence of coronary angioplasty implant and graft: Secondary | ICD-10-CM

## 2016-09-22 DIAGNOSIS — E785 Hyperlipidemia, unspecified: Secondary | ICD-10-CM

## 2016-09-22 DIAGNOSIS — I2510 Atherosclerotic heart disease of native coronary artery without angina pectoris: Secondary | ICD-10-CM

## 2016-09-23 ENCOUNTER — Other Ambulatory Visit (INDEPENDENT_AMBULATORY_CARE_PROVIDER_SITE_OTHER): Payer: BC Managed Care – PPO

## 2016-09-23 DIAGNOSIS — E785 Hyperlipidemia, unspecified: Secondary | ICD-10-CM

## 2016-09-23 DIAGNOSIS — Z955 Presence of coronary angioplasty implant and graft: Secondary | ICD-10-CM

## 2016-09-23 DIAGNOSIS — I2510 Atherosclerotic heart disease of native coronary artery without angina pectoris: Secondary | ICD-10-CM

## 2016-09-23 DIAGNOSIS — Z6835 Body mass index (BMI) 35.0-35.9, adult: Secondary | ICD-10-CM

## 2016-09-24 LAB — BASIC METABOLIC PANEL
BUN/Creatinine Ratio: 15 (ref 9–20)
BUN: 15 mg/dL (ref 6–24)
CO2: 21 mmol/L (ref 20–29)
CREATININE: 1.01 mg/dL (ref 0.76–1.27)
Calcium: 9.5 mg/dL (ref 8.7–10.2)
Chloride: 102 mmol/L (ref 96–106)
GFR calc Af Amer: 94 mL/min/{1.73_m2} (ref >59)
GFR calc non Af Amer: 81 mL/min/{1.73_m2} (ref >59)
Glucose: 145 mg/dL — ABNORMAL HIGH (ref 65–99)
Potassium: 4 mmol/L (ref 3.5–5.2)
Sodium: 140 mmol/L (ref 134–144)

## 2016-10-06 ENCOUNTER — Other Ambulatory Visit: Payer: Self-pay | Admitting: *Deleted

## 2016-10-10 ENCOUNTER — Ambulatory Visit (INDEPENDENT_AMBULATORY_CARE_PROVIDER_SITE_OTHER): Payer: BC Managed Care – PPO | Admitting: Pharmacist

## 2016-10-10 DIAGNOSIS — E785 Hyperlipidemia, unspecified: Secondary | ICD-10-CM | POA: Diagnosis not present

## 2016-10-10 NOTE — Progress Notes (Signed)
Patient ID: Dean Sullivan                 DOB: Mar 29, 1955                    MRN: [id number]     HPI: Dean Sullivan is a 61 y.o. male patient of Dr. Rockey Situ that presents today for lipid evaluation.  PMH includes CAD with obstructive disease in RCA and LAD with stent placed in each vessel, HLD, OSA on CPAP.   Patient presents today to discuss lipid management. He reports he has had issues with statins in the past. He is currently tolerating rosuvastatin 10mg  daily, but has only been on this therapy for about 2 months. He is very concerned that some on his recent tenderness/stiffness is related to rosuvastatin.    Risk Factors: CAD s/p stenting LDL Goal: <70  Current Medications: rosuvastatin 10mg  daily  Intolerances: zetia - tried for 3 days and immediate had pain which resolved upon discontinuation of therapy. welchol - muscle pains which resolved upon discontinuation, crestor 20mg  daily - cramps all over body (particularly in neck) which resolved upon discontinuation of medications, simvastatin 20mg  daily - muscle cramping  Diet: Most meals from home. He does peel skin off. Rare that he has steak. Fish and chicken mostly. He tries to avoid things high in cholesterol.   Exercise: Walk and flies stunt kites. He was weightlifting but limited until due shoulder injury. He is working with ortho.   Family History: Alcohol abuse in his father; Arthritis in his maternal grandmother; Cancer in his father; Diabetes in his mother; Heart Problems in his father; Other in his maternal aunt; Stroke in his father.   Social History: Denies tobacco products.   Labs: 09/05/16: TC 158, TG 130, HDL 34, LDL 98 - rosuvastatin 10mg  daily  Past Medical History:  Diagnosis Date  . Allergy   . Blockage of coronary artery bypass graft    patient stated that he had blockages - stents were inserted.  . Coronary arteriosclerosis in native artery   . Glaucoma   . Hearing loss of right ear    patient was told he  had slight hearing loss  . History of trigger finger    bilateral, but mostly in right thumb  . Hyperlipidemia   . Hypertension   . Prediabetes 09/10/2016  . Sleep apnea     Current Outpatient Prescriptions on File Prior to Visit  Medication Sig Dispense Refill  . aspirin 81 MG tablet Take 81 mg by mouth daily.    . Cholecalciferol (VITAMIN D) 2000 UNITS tablet Take 2,000 Units by mouth daily.     Marland Kitchen latanoprost (XALATAN) 0.005 % ophthalmic solution Place 1 drop into both eyes at bedtime.     Marland Kitchen losartan (COZAAR) 50 MG tablet Take 1 tablet (50 mg total) by mouth daily. (Patient taking differently: Take 25 mg by mouth daily. ) 90 tablet 3  . meloxicam (MOBIC) 15 MG tablet Take 15 mg by mouth daily.     . rosuvastatin (CRESTOR) 20 MG tablet Take 1 tablet (20 mg total) by mouth at bedtime. (Patient taking differently: Take 10 mg by mouth at bedtime. ) 90 tablet 3   No current facility-administered medications on file prior to visit.     No Known Allergies  Assessment/Plan: Hyperlipidemia: LDL not at goal on maximally tolerated statin. He is willing to remain on current dose of rosuvastatin. HDQQ2W therapy is only additional agent to help with LDL lowering.  Will pursue therapy. Pt is provided copay card today.   Thank you,  Lelan Pons. Patterson Hammers[place], [facility]  10/10/2016 8:07 AM

## 2016-10-10 NOTE — Patient Instructions (Signed)
We will send for coverage of PSCK9i inhibitor (Repatha or Praluent depending on prescription coverage).    Cholesterol Cholesterol is a fat. Your body needs a small amount of cholesterol. Cholesterol (plaque) may build up in your blood vessels (arteries). That makes you more likely to have a heart attack or stroke. You cannot feel your cholesterol level. Having a blood test is the only way to find out if your level is high. Keep your test results. Work with your doctor to keep your cholesterol at a good level. What do the results mean?  Total cholesterol is how much cholesterol is in your blood.  LDL is bad cholesterol. This is the type that can build up. Try to have low LDL.  HDL is good cholesterol. It cleans your blood vessels and carries LDL away. Try to have high HDL.  Triglycerides are fat that the body can store or burn for energy. What are good levels of cholesterol?  Total cholesterol below 200.  LDL below 100 is good for people who have health risks. LDL below 70 is good for people who have very high risks.  HDL above 40 is good. It is best to have HDL of 60 or higher.  Triglycerides below 150. How can I lower my cholesterol? Diet Follow your diet program as told by your doctor.  Choose fish, white meat chicken, or [place] that is roasted or baked. Try not to eat red meat, fried foods, sausage, or lunch meats.  Eat lots of fresh fruits and vegetables.  Choose whole grains, beans, pasta, potatoes, and cereals.  Choose olive oil, corn oil, or canola oil. Only use small amounts.  Try not to eat butter, mayonnaise, shortening, or palm kernel oils.  Try not to eat foods with trans fats.  Choose low-fat or nonfat dairy foods. ? Drink skim or nonfat milk. ? Eat low-fat or nonfat yogurt and cheeses. ? Try not to drink whole milk or cream. ? Try not to eat ice cream, egg yolks, or full-fat cheeses.  Healthy desserts include angel food cake, ginger snaps, animal crackers,  hard candy, popsicles, and low-fat or nonfat frozen yogurt. Try not to eat pastries, cakes, pies, and cookies.  Exercise Follow your exercise program as told by your doctor.  Be more active. Try gardening, walking, and taking the stairs.  Ask your doctor about ways that you can be more active.  Medicine  Take over-the-counter and prescription medicines only as told by your doctor.  This information is not intended to replace advice given to you by your health care provider. Make sure you discuss any questions you have with your health care provider. Document Released: 06/06/2008 Document Revised: 10/10/2015 Document Reviewed: 09/20/2015 Elsevier Interactive Patient Education  2017 [employer].

## 2016-10-17 ENCOUNTER — Telehealth: Payer: Self-pay | Admitting: Pharmacist

## 2016-10-17 MED ORDER — EVOLOCUMAB 140 MG/ML ~~LOC~~ SOAJ: 1.0000 "pen " | SUBCUTANEOUS | 11 refills | Status: DC

## 2016-10-17 NOTE — Telephone Encounter (Signed)
Received Repatha approval from [facility] through 2019. [facility]. Pt aware and will call once he receives his first shipment so we can set up lab work. He would like to give his first injection in clinic as well.

## 2016-11-07 ENCOUNTER — Other Ambulatory Visit: Payer: Self-pay | Admitting: Specialist

## 2016-11-07 DIAGNOSIS — M7542 Impingement syndrome of left shoulder: Secondary | ICD-10-CM

## 2016-11-28 ENCOUNTER — Telehealth: Payer: Self-pay | Admitting: Pharmacist

## 2016-11-28 DIAGNOSIS — E785 Hyperlipidemia, unspecified: Secondary | ICD-10-CM

## 2016-11-28 NOTE — Telephone Encounter (Signed)
Spoke with patient - he gave his 3rd dose of Repatha yesterday. He will come to [place] office for labs after his 4th dose. He states that he does have some tendonitis in his wrist from previous. He is unsure if this has worsened on medication. Advised to monitor and call if he feels it worsened but that doubtful this would worsen due to the [place]. He states understanding.

## 2016-12-16 ENCOUNTER — Other Ambulatory Visit (INDEPENDENT_AMBULATORY_CARE_PROVIDER_SITE_OTHER): Payer: BC Managed Care – PPO | Admitting: *Deleted

## 2016-12-16 DIAGNOSIS — E785 Hyperlipidemia, unspecified: Secondary | ICD-10-CM | POA: Diagnosis not present

## 2016-12-17 LAB — HEPATIC FUNCTION PANEL
ALBUMIN: 4.7 g/dL (ref 3.6–4.8)
ALK PHOS: 90 IU/L (ref 39–117)
ALT: 24 IU/L (ref 0–44)
AST: 22 IU/L (ref 0–40)
Bilirubin Total: 0.7 mg/dL (ref 0.0–1.2)
Bilirubin, Direct: 0.19 mg/dL (ref 0.00–0.40)
TOTAL PROTEIN: 7.2 g/dL (ref 6.0–8.5)

## 2016-12-17 LAB — LIPID PANEL
Chol/HDL Ratio: 2.2 ratio (ref 0.0–5.0)
Cholesterol, Total: 77 mg/dL — ABNORMAL LOW (ref 100–199)
HDL: 35 mg/dL — ABNORMAL LOW (ref >39)
LDL Calculated: 15 mg/dL (ref 0–99)
Triglycerides: 137 mg/dL (ref 0–149)
VLDL CHOLESTEROL CAL: 27 mg/dL (ref 5–40)

## 2016-12-18 ENCOUNTER — Telehealth: Payer: Self-pay | Admitting: *Deleted

## 2016-12-18 MED ORDER — ROSUVASTATIN CALCIUM 5 MG PO TABS: 5.0000 mg | ORAL_TABLET | Freq: Every day | ORAL | 3 refills | Status: DC

## 2016-12-18 NOTE — Telephone Encounter (Signed)
Reviewed results and recommendations with patient and he verbalized understanding with no further questions. Sent in new prescription for crestor and he was appreciative for the call and results.

## 2016-12-18 NOTE — Telephone Encounter (Signed)
-----   Message from Minna Merritts, MD sent at 12/18/2016  3:46 PM EDT ----- Lipid panel Numbers are very low We can probably change to Crestor 5 mg daily, down from 10

## 2016-12-31 DIAGNOSIS — M6530 Trigger finger, unspecified finger: Secondary | ICD-10-CM | POA: Insufficient documentation

## 2017-01-12 DIAGNOSIS — S46102A Unspecified injury of muscle, fascia and tendon of long head of biceps, left arm, initial encounter: Secondary | ICD-10-CM | POA: Insufficient documentation

## 2017-01-12 DIAGNOSIS — M75122 Complete rotator cuff tear or rupture of left shoulder, not specified as traumatic: Secondary | ICD-10-CM | POA: Insufficient documentation

## 2017-01-12 DIAGNOSIS — M7582 Other shoulder lesions, left shoulder: Secondary | ICD-10-CM | POA: Insufficient documentation

## 2017-01-26 DIAGNOSIS — M75102 Unspecified rotator cuff tear or rupture of left shoulder, not specified as traumatic: Secondary | ICD-10-CM | POA: Insufficient documentation

## 2017-02-18 ENCOUNTER — Telehealth: Payer: Self-pay | Admitting: Cardiovascular Disease

## 2017-02-18 NOTE — Telephone Encounter (Signed)
Received request for cardiac clearance from [facility] for left rotator cuff repair at [facility] on 03/02/17 by Dr. Earnestine Leys. Please route clearance to Fax # [phone]

## 2017-02-18 NOTE — Telephone Encounter (Signed)
Would confirm there has been no change in his clinical status over the past 5 months If no changes, would be acceptable risk for surgery

## 2017-02-18 NOTE — Telephone Encounter (Signed)
Spoke with patient and he denies any changes since his last visit in our office. He was appreciative for the call back and had no further questions at this time. Will route this clearance via Epic to number listed.

## 2017-02-19 ENCOUNTER — Ambulatory Visit: Payer: BC Managed Care – PPO | Admitting: Family Medicine

## 2017-02-19 ENCOUNTER — Encounter: Payer: Self-pay | Admitting: Family Medicine

## 2017-02-19 ENCOUNTER — Other Ambulatory Visit: Payer: Self-pay | Admitting: Family Medicine

## 2017-02-19 VITALS — BP 134/82 | HR 93 | Temp 98.6°F | Resp 14 | Wt 282.4 lb

## 2017-02-19 DIAGNOSIS — R7303 Prediabetes: Secondary | ICD-10-CM | POA: Diagnosis not present

## 2017-02-19 DIAGNOSIS — Z01818 Encounter for other preprocedural examination: Secondary | ICD-10-CM | POA: Diagnosis not present

## 2017-02-19 DIAGNOSIS — E785 Hyperlipidemia, unspecified: Secondary | ICD-10-CM | POA: Diagnosis not present

## 2017-02-19 DIAGNOSIS — K068 Other specified disorders of gingiva and edentulous alveolar ridge: Secondary | ICD-10-CM

## 2017-02-19 DIAGNOSIS — Z1211 Encounter for screening for malignant neoplasm of colon: Secondary | ICD-10-CM | POA: Insufficient documentation

## 2017-02-19 NOTE — Progress Notes (Signed)
BP 134/82   Pulse 93   Temp 98.6 F (37 C) (Oral)   Resp 14   Wt 282 lb 6.4 oz (128.1 kg)   SpO2 99%   BMI 34.83 kg/m    Subjective:    Patient ID: Dean Sullivan, male    DOB: [birth year], 61 y.o.   MRN: [id number]  HPI: Dean Sullivan is a 61 y.o. male  Chief Complaint  Patient presents with  . surgical clearance    Left shoulder surgery, already been cleared through cardio.    HPI Patient is here for clearance prior to surgery He has already gotten cardiac clearance Dr. Sabra Heck is going to do the left surgery on December 10th He has a complete tear of the rotator cuff and rotator cuff tendinitis He cannot do a lot of heavy liftning; at night, he takes OTC  Maneuvers at night to sleep Hurts all the time Throbs with activity  MRI shoulder report per Dr. Nicholaus Bloom note 02/12/17 Imaging:  Shoulder Imaging, MRI: Left Shoulder: MRI Shoulder Cartilage: No cartilage abnormality. MRI Shoulder Rotator Cuff: Full thickness tear of the supraspinatus. Retracted to the humeral head. Partial-thickness superior insertional tear of subscapularis tendon. Tendinopathic changes of infraspinatus tendon. MRI Shoulder Labrum / Biceps: Longitudinal split tear of the biceps tendon with medial subluxation. MRI Shoulder Bone: Normal bone.   He is right-handed  No sores on the body; no hx of MRSA He scars easily; he will get keloids, abnormal scar formation on the scalp; saw dermatologist No fevers  Has allergy thing going around, coughing and sneezing Right ear is itching all the time; he cleaned it out, just seems to itch and feels like something is in it all the time Uses CPAP and cleans that with soap and water; uses sterile bottled water Ear thing has been 2-3 weeks; thinks he had some allergies and clogging up at night; had clear mucous which switched to green, and that went away; taking zyrtec; getting better; had a lot of congestion  No shortness of breath or wheezing No chest  pain Able to walk a lot; has had sciatica on the right for years Flies stunt kites; flew one day and then woke up the next morning, felt pain under the bone in the left buttock; better now, but  Piriformis muscle near middle; did too many reaching down, pulled muscle in the butt  No bleeding from the nose or in urine or stool Does have some bleeding in the gums  No trouble waking up from general sedation; has never had general surgery; no family trouble with anesthesia  Prediabetes, no excessive dry mouth, does drink a lot of water  Depression screen [facility] 2/9 02/19/2017 09/10/2016 03/12/2016 02/12/2015 01/05/2015  Decreased Interest 0 0 0 0 0  Down, Depressed, Hopeless 0 0 0 0 0  PHQ - 2 Score 0 0 0 0 0    Relevant past medical, surgical, family and social history reviewed Past Medical History:  Diagnosis Date  . Allergy   . Blockage of coronary artery bypass graft    patient stated that he had blockages - stents were inserted.  . Coronary arteriosclerosis in native artery   . GERD (gastroesophageal reflux disease)    occ  . Glaucoma   . Hearing loss of right ear    patient was told he had slight hearing loss  . History of kidney stones    h/o  . History of trigger finger    bilateral, but mostly in right  thumb  . Hyperlipidemia   . Hypertension   . Irregular heart beat   . Prediabetes 09/10/2016  . Sleep apnea    cpap   Past Surgical History:  Procedure Laterality Date  . CARDIAC CATHETERIZATION     x2 stents [address]  2012   2   Family History  Problem Relation Age of Onset  . Diabetes Mother        adult onset - geriatric  . Alcohol abuse Father   . Cancer Father        prostate  . Stroke Father        mini strokes  . Heart Problems Father   . Other Maternal Aunt        born with her lung outside of her body; lived until 98.  . Arthritis Maternal Grandmother    Social History   Tobacco Use  . Smoking status: Never  Smoker  . Smokeless tobacco: Never Used  Substance Use Topics  . Alcohol use: Yes    Alcohol/week: 0.0 oz    Comment: a glass of wine/beer  . Drug use: No    Interim medical history since last visit reviewed. Allergies and medications reviewed  Review of Systems Per HPI unless specifically indicated above     Objective:    BP 134/82   Pulse 93   Temp 98.6 F (37 C) (Oral)   Resp 14   Wt 282 lb 6.4 oz (128.1 kg)   SpO2 99%   BMI 34.83 kg/m   Wt Readings from Last 3 Encounters:  02/19/17 282 lb 6.4 oz (128.1 kg)  09/10/16 275 lb 6.4 oz (124.9 kg)  09/05/16 281 lb 4 oz (127.6 kg)    Physical Exam  Constitutional: He appears well-developed and well-nourished. No distress.  HENT:  Head: Normocephalic and atraumatic.  Nose: No rhinorrhea.  Mouth/Throat: Mucous membranes are normal. Mucous membranes are not dry.  Mild gum disease, no hypertrophy  Eyes: EOM are normal. No scleral icterus.  Neck: No thyromegaly present.  Cardiovascular: Normal rate and regular rhythm.  Pulmonary/Chest: Effort normal and breath sounds normal.  Abdominal: Soft. Bowel sounds are normal. He exhibits no distension.  Musculoskeletal: He exhibits no edema.  Neurological: Coordination normal.  Skin: Skin is warm and dry. He is not diaphoretic. No pallor.  Psychiatric: He has a normal mood and affect. His behavior is normal. Judgment and thought content normal. His mood appears not anxious. He does not exhibit a depressed mood.       Assessment & Plan:   Problem List Items Addressed This Visit      Other   Prediabetes    Check glucose and A1c      Relevant Orders   Hemoglobin A1c (Completed)   Hyperlipidemia LDL goal <70    Managed by cardiologist       Other Visit Diagnoses    Pre-operative examination    -  Primary   Relevant Orders   CBC with Differential/Platelet (Completed)   Hemoglobin A1c (Completed)   Urinalysis w microscopic + reflex cultur (Completed)   Bleeding gums        Relevant Orders   CBC with Differential/Platelet (Completed)       Follow up plan: No Follow-up on file.  An after-visit summary was printed and given to the patient at [place].  Please see the patient instructions which may contain other information and recommendations beyond what is mentioned above in the assessment and  plan.  No orders of the defined types were placed in this encounter.   Orders Placed This Encounter  Procedures  . CBC with Differential/Platelet  . Hemoglobin A1c  . Urinalysis w microscopic + reflex cultur  . REFLEXIVE URINE CULTURE

## 2017-02-19 NOTE — Patient Instructions (Signed)
We'll contact you about your lab results Best wishes with your surgery

## 2017-02-19 NOTE — Assessment & Plan Note (Signed)
Managed by cardiologist 

## 2017-02-19 NOTE — Assessment & Plan Note (Signed)
Due for colonoscopy Jan 2019

## 2017-02-19 NOTE — Assessment & Plan Note (Signed)
Check glucose and A1c 

## 2017-02-20 ENCOUNTER — Other Ambulatory Visit: Payer: Self-pay

## 2017-02-20 DIAGNOSIS — Z1211 Encounter for screening for malignant neoplasm of colon: Secondary | ICD-10-CM

## 2017-02-20 LAB — CBC WITH DIFFERENTIAL/PLATELET
BASOS PCT: 0.7 %
Basophils Absolute: 41 cells/uL (ref 0–200)
EOS ABS: 215 {cells}/uL (ref 15–500)
Eosinophils Relative: 3.7 %
HCT: 39.2 % (ref 38.5–50.0)
Hemoglobin: 13.1 g/dL — ABNORMAL LOW (ref 13.2–17.1)
Lymphs Abs: 2894 cells/uL (ref 850–3900)
MCH: 27 pg (ref 27.0–33.0)
MCHC: 33.4 g/dL (ref 32.0–36.0)
MCV: 80.7 fL (ref 80.0–100.0)
MPV: 10.3 fL (ref 7.5–12.5)
Monocytes Relative: 7.1 %
NEUTROS PCT: 38.6 %
Neutro Abs: 2239 cells/uL (ref 1500–7800)
PLATELETS: 258 10*3/uL (ref 140–400)
RBC: 4.86 10*6/uL (ref 4.20–5.80)
RDW: 13.9 % (ref 11.0–15.0)
TOTAL LYMPHOCYTE: 49.9 %
WBC: 5.8 10*3/uL (ref 3.8–10.8)
WBCMIX: 412 {cells}/uL (ref 200–950)

## 2017-02-20 LAB — URINALYSIS W MICROSCOPIC + REFLEX CULTURE
BACTERIA UA: NONE SEEN /HPF
BILIRUBIN URINE: NEGATIVE
Glucose, UA: NEGATIVE
HGB URINE DIPSTICK: NEGATIVE
Hyaline Cast: NONE SEEN /LPF
KETONES UR: NEGATIVE
LEUKOCYTE ESTERASE: NEGATIVE
NITRITES URINE, INITIAL: NEGATIVE
Protein, ur: NEGATIVE
RBC / HPF: NONE SEEN /HPF (ref 0–2)
SPECIFIC GRAVITY, URINE: 1.018 (ref 1.001–1.03)
Squamous Epithelial / LPF: NONE SEEN /HPF (ref <=5)
WBC UA: NONE SEEN /HPF (ref 0–5)
pH: 8 (ref 5.0–8.0)

## 2017-02-20 LAB — HEMOGLOBIN A1C
Hgb A1c MFr Bld: 6.7 % of total Hgb — ABNORMAL HIGH (ref <5.7)
MEAN PLASMA GLUCOSE: 146 (calc)
eAG (mmol/L): 8.1 (calc)

## 2017-02-20 LAB — NO CULTURE INDICATED

## 2017-02-24 ENCOUNTER — Other Ambulatory Visit: Payer: Self-pay

## 2017-02-24 ENCOUNTER — Telehealth: Payer: Self-pay | Admitting: Family Medicine

## 2017-02-24 ENCOUNTER — Encounter
Admission: RE | Admit: 2017-02-24 | Discharge: 2017-02-24 | Disposition: A | Discharge status: Home / Self Care | Payer: BC Managed Care – PPO | Source: Ambulatory Visit | Attending: Specialist | Admitting: Specialist

## 2017-02-24 ENCOUNTER — Encounter: Payer: Self-pay | Admitting: Cardiovascular Disease

## 2017-02-24 HISTORY — DX: Cardiac arrhythmia, unspecified: I49.9

## 2017-02-24 HISTORY — DX: Gastro-esophageal reflux disease without esophagitis: K21.9

## 2017-02-24 HISTORY — DX: Personal history of urinary calculi: Z87.442

## 2017-02-24 NOTE — Telephone Encounter (Signed)
Copied from [place]. Topic: Quick Communication - See Telephone Encounter >> Feb 24, 2017 12:01 PM Robina Ade, Helene Kelp D wrote: CRM for notification. See Telephone encounter for: 02/24/17. Sherry from [facility] called about a medical clearance form they faxed over for provider to fax back. She can be reached at [phone].

## 2017-02-24 NOTE — Telephone Encounter (Signed)
[facility] calling for status of Cardiac Clearance Please call Sherri at [phone] ext [phone]

## 2017-02-24 NOTE — Patient Instructions (Addendum)
  Your procedure is scheduled on: 03-02-17 Report to Same Day Surgery 2nd floor medical mall [facility] Entrance-take elevator on left to 2nd floor.  Check in with surgery information desk.) To find out your arrival time please call [phone] between 1PM - 3PM on 02-27-17  Remember: Instructions that are not followed completely may result in serious medical risk, up to and including death, or upon the discretion of your surgeon and anesthesiologist your surgery may need to be rescheduled.    _x___ 1. Do not eat food after midnight the night before your procedure. You may drink clear liquids up to 2 hours before you are scheduled to arrive at the hospital for your procedure.  Do not drink clear liquids within 2 hours of your scheduled arrival to the hospital.  Clear liquids include  --Water or Apple juice without pulp  --Clear carbohydrate beverage such as ClearFast or Gatorade  --Black Coffee or Clear Tea (No milk, no creamers, do not add anything to the coffee or Tea   No gum chewing or hard candies.     __x__ 2. No Alcohol for 24 hours before or after surgery.   __x__3. No Smoking for 24 prior to surgery.   ____  4. Bring all medications with you on the day of surgery if instructed.    __x__ 5. Notify your doctor if there is any change in your medical condition     (cold, fever, infections).     Do not wear jewelry, make-up, hairpins, clips or nail polish.  Do not wear lotions, powders, or perfumes. You may wear deodorant.  Do not shave 48 hours prior to surgery. Men may shave face and neck.  Do not bring valuables to the hospital.    [facility] is not responsible for any belongings or valuables.               Contacts, dentures or bridgework may not be worn into surgery.  Leave your suitcase in the car. After surgery it may be brought to your room.  For patients admitted to the hospital, discharge time is determined by your treatment team.   Patients discharged the day of  surgery will not be allowed to drive home.  You will need someone to drive you home and stay with you the night of your procedure.   ____ Take anti-hypertensive listed below, cardiac, seizure, asthma, anti-reflux and psychiatric medicines. These include:  1. NONE  2.  3.  4.  5.  6.  ____Fleets enema or Magnesium Citrate as directed.   _x___ Use CHG Soap or sage wipes as directed on instruction sheet   ____ Use inhalers on the day of surgery and bring to hospital day of surgery  ____ Stop Metformin and Janumet 2 days prior to surgery.    ____ Take 1/2 of usual insulin dose the night before surgery and none on the morning  surgery.   _X___ Follow recommendations from Cardiologist, Pulmonologist or PCP regarding  stopping Aspirin, Coumadin, Plavix ,Eliquis, Effient, or Pradaxa, and Pletal-ASK DR MILLER/[facility]  OFFICE ABOUT STOPPING YOUR ASPIRIN  X____Stop Anti-inflammatories such as Advil, Aleve, Ibuprofen, Motrin, Naproxen, Naprosyn, Goodies powders or aspirin products NOW-OK to take Tylenol    ____ Stop supplements until after surgery.     _X___ Bring C-Pap to the hospital.

## 2017-02-24 NOTE — Telephone Encounter (Signed)
Spoke with [place] and reviewed that clearance was sent over on 02/18/17. Per Dr. Rockey Situ patient is acceptable risk for surgery. Left my number for her to return call if any further questions. Will route this back to number listed.

## 2017-02-24 NOTE — Pre-Procedure Instructions (Signed)
Dean Courser, MD  Physician  Family Medicine  Progress Notes  Sign at close encounter  Encounter Date:  02/19/2017          Sign at close encounter            [] Hide copied text  [] Hover for details     BP 134/82   Pulse 93   Temp 98.6 F (37 C) (Oral)   Resp 14   Wt 282 lb 6.4 oz (128.1 kg)   SpO2 99%   BMI 34.83 kg/m    Subjective:    Patient ID: Dean Sullivan, male    DOB: 1955/11/12, 61 y.o.   MRN: [id number]  HPI: Dean Sullivan is a 61 y.o. male      Chief Complaint  Patient presents with  . surgical clearance    Left shoulder surgery, already been cleared through cardio.    HPI Patient is here for clearance prior to surgery He has already gotten cardiac clearance Dr. Sabra Heck is going to do the left surgery on December 10th He has a complete tear of the rotator cuff and rotator cuff tendinitis He cannot do a lot of heavy liftning; at night, he takes OTC  Maneuvers at night to sleep Hurts all the time Throbs with activity  MRI shoulder report per Dr. Nicholaus Bloom note 02/12/17 Imaging:  Shoulder Imaging, MRI: Left Shoulder: MRI Shoulder Cartilage: No cartilage abnormality. MRI Shoulder Rotator Cuff: Full thickness tear of the supraspinatus. Retracted to the humeral head. Partial-thickness superior insertional tear of subscapularis tendon. Tendinopathic changes of infraspinatus tendon. MRI Shoulder Labrum / Biceps: Longitudinal split tear of the biceps tendon with medial subluxation. MRI Shoulder Bone: Normal bone.   He is right-handed  No sores on the body; no hx of MRSA He scars easily; he will get keloids, abnormal scar formation on the scalp; saw dermatologist No fevers  Has allergy thing going around, coughing and sneezing Right ear is itching all the time; he cleaned it out, just seems to itch and feels like something is in it all the time Uses CPAP and cleans that with soap and water; uses sterile bottled water Ear  thing has been 2-3 weeks; thinks he had some allergies and clogging up at night; had clear mucous which switched to green, and that went away; taking zyrtec; getting better; had a lot of congestion  No shortness of breath or wheezing No chest pain Able to walk a lot; has had sciatica on the right for years Flies stunt kites; flew one day and then woke up the next morning, felt pain under the bone in the left buttock; better now, but  Piriformis muscle near middle; did too many reaching down, pulled muscle in the butt  No bleeding from the nose or in urine or stool Does have some bleeding in the gums  No trouble waking up from general sedation; has never had general surgery; no family trouble with anesthesia  Prediabetes, no excessive dry mouth, does drink a lot of water  Depression screen [facility] 2/9 02/19/2017 09/10/2016 03/12/2016 02/12/2015 01/05/2015  Decreased Interest 0 0 0 0 0  Down, Depressed, Hopeless 0 0 0 0 0  PHQ - 2 Score 0 0 0 0 0    Relevant past medical, surgical, family and social history reviewed     Past Medical History:  Diagnosis Date  . Allergy   . Blockage of coronary artery bypass graft    patient stated that he had blockages - stents were inserted.  Marland Kitchen  Coronary arteriosclerosis in native artery   . Glaucoma   . Hearing loss of right ear    patient was told he had slight hearing loss  . History of trigger finger    bilateral, but mostly in right thumb  . Hyperlipidemia   . Hypertension   . Prediabetes 09/10/2016  . Sleep apnea         Past Surgical History:  Procedure Laterality Date  . CARDIAC CATHETERIZATION     x2 stents [place]  2012   2        Family History  Problem Relation Age of Onset  . Diabetes Mother        adult onset - geriatric  . Alcohol abuse Father   . Cancer Father        prostate  . Stroke Father        mini strokes  . Heart Problems Father   . Other  Maternal Aunt        born with her lung outside of her body; lived until 71.  . Arthritis Maternal Grandmother    Social History        Tobacco Use  . Smoking status: Never Smoker  . Smokeless tobacco: Never Used  Substance Use Topics  . Alcohol use: Yes    Alcohol/week: 0.0 oz    Comment: a glass of wine   . Drug use: No    Interim medical history since last visit reviewed. Allergies and medications reviewed  Review of Systems Per HPI unless specifically indicated above     Objective:  BP 134/82   Pulse 93   Temp 98.6 F (37 C) (Oral)   Resp 14   Wt 282 lb 6.4 oz (128.1 kg)   SpO2 99%   BMI 34.83 kg/m      Wt Readings from Last 3 Encounters:  02/19/17 282 lb 6.4 oz (128.1 kg)  09/10/16 275 lb 6.4 oz (124.9 kg)  09/05/16 281 lb 4 oz (127.6 kg)    Physical Exam       Results for orders placed or performed in visit on 12/16/16  Lipid Profile  Result Value Ref Range   Cholesterol, Total 77 (L) 100 - 199 mg/dL   Triglycerides 137 0 - 149 mg/dL   HDL 35 (L) >39 mg/dL   VLDL Cholesterol Cal 27 5 - 40 mg/dL   LDL Calculated 15 0 - 99 mg/dL   Chol/HDL Ratio 2.2 0.0 - 5.0 ratio  Hepatic function panel  Result Value Ref Range   Total Protein 7.2 6.0 - 8.5 g/dL   Albumin 4.7 3.6 - 4.8 g/dL   Bilirubin Total 0.7 0.0 - 1.2 mg/dL   Bilirubin, Direct 0.19 0.00 - 0.40 mg/dL   Alkaline Phosphatase 90 39 - 117 IU/L   AST 22 0 - 40 IU/L   ALT 24 0 - 44 IU/L      Assessment & Plan:      Problem List Items Addressed This Visit    None       Follow up plan: No Follow-up on file.  An after-visit summary was printed and given to the patient at [place].  Please see the patient instructions which may contain other information and recommendations beyond what is mentioned above in the assessment and plan.  No orders of the defined types were placed in this encounter.   No orders of the defined types were placed in this  encounter.  Office Visit on 02/19/2017        Detailed Report

## 2017-02-25 ENCOUNTER — Other Ambulatory Visit: Payer: Self-pay | Admitting: Specialist

## 2017-02-25 NOTE — Telephone Encounter (Signed)
Done faxed over °

## 2017-02-25 NOTE — Telephone Encounter (Signed)
Dr. Sabra Heck called back regarding patient. Per Dr. Rockey Situ patient is acceptable risk for surgery and that he could hold aspirin. No further needs at this time.

## 2017-02-26 NOTE — Telephone Encounter (Signed)
Received second request for clearance.  This encounter routed via Epic fax to Dr Sabra Heck, [phone].

## 2017-02-27 NOTE — Pre-Procedure Instructions (Signed)
Cardiac and medical clearance on chart from Dr Sanda Klein and Dr Rockey Situ

## 2017-02-27 NOTE — Pre-Procedure Instructions (Signed)
Minna Merritts, MD  Physician  Cardiology  Progress Notes  Signed  Encounter Date:  09/05/2016          Signed      Expand All Collapse All       [] Hide copied text  [] Hover for details   Patient ID: Dean Sullivan, male   DOB: 10/29/1955, 61 y.o.   MRN: [id number] Cardiology Office Note  Date:  09/05/2016   ID:  Dean Sullivan, DOB 1955/05/24, MRN [id number]  PCP:  Arnetha Courser, MD         Chief Complaint  Patient presents with  . other    12 month follow up. Meds reviewed by the pt. verbally. "doing well."     HPI:  Dean Sullivan is a pleasant 61 year old gentleman with history of  coronary artery disease,   prior cardiac catheterization showing obstructive disease in his RCA and LAD, stent placed to each vessel  hyperlipidemia,  obstructive sleep apnea on CPAP,  presenting for Routine follow-up of his coronary artery disease   In follow-up he reports that he is doing well with no complaints Some shoulder discomfort, arthritic pain in the left shoulder, muscles in his back, Continues to take Crestor 10 mg daily. He had myalgias on 20 mg Did not tolerate Zetia Previously had side effects on WelChol  Denies having any shortness of breath or chest pain on exertion No regular exercise regimen  EKG on today's visit shows normal sinus rhythm with rate 87 bpm, T-wave abnormality anterolateral leads, inferior leads, PVCs   no recent lab work Prior total cholesterol 140, LDL 80s    other past medical history reviewed Reports having 2 stents placed either in 2012 or 2013 at hospital in [place].   PMH:   has a past medical history of Allergy; Blockage of coronary artery bypass graft; Coronary arteriosclerosis in native artery; Hearing loss of right ear; History of trigger finger; Hyperlipidemia; Hypertension; and Sleep apnea.  PSH:         Past Surgical History:  Procedure Laterality Date  . CARDIAC CATHETERIZATION     x2 stents  [place]  2012   2          Current Outpatient Prescriptions  Medication Sig Dispense Refill  . aspirin 81 MG tablet Take 81 mg by mouth daily.    . Cholecalciferol (VITAMIN D) 2000 UNITS tablet Take 1,000 Units by mouth daily.     Marland Kitchen latanoprost (XALATAN) 0.005 % ophthalmic solution Place 1 drop into both eyes at bedtime.     Marland Kitchen losartan (COZAAR) 25 MG tablet Take 1 tablet (25 mg total) by mouth daily. 90 tablet 3  . meloxicam (MOBIC) 15 MG tablet Take 15 mg by mouth daily.     . rosuvastatin (CRESTOR) 20 MG tablet Take 0.5 tablets (10 mg total) by mouth at bedtime. 45 tablet 3   No current facility-administered medications for this visit.      Allergies:   Patient has no known allergies.   Social History:  The patient  reports that he has never smoked. He has never used smokeless tobacco. He reports that he drinks alcohol. He reports that he does not use drugs.   Family History:   family history includes Alcohol abuse in his father; Arthritis in his maternal grandmother; Cancer in his father; Diabetes in his mother; Heart Problems in his father; Other in his maternal aunt; Stroke in his father.    Review of  Systems: Review of Systems  Constitutional: Negative.   Respiratory: Negative.   Cardiovascular: Negative.   Gastrointestinal: Negative.   Musculoskeletal: Positive for joint pain.  Neurological: Negative.   Psychiatric/Behavioral: Negative.   All other systems reviewed and are negative.    PHYSICAL EXAM: VS:  BP (!) 150/90 (BP Location: Left Arm, Patient Position: Sitting, Cuff Size: Normal)   Pulse 74   Ht 6' 3.5" (1.918 m)   Wt 281 lb 4 oz (127.6 kg)   BMI 34.69 kg/m  , BMI Body mass index is 34.69 kg/m. GEN: Well nourished, well developed, in no acute distress  HEENT: normal  Neck: no JVD, carotid bruits, or masses Cardiac: RRR; no murmurs, rubs, or gallops,no edema  Respiratory:  clear to  auscultation bilaterally, normal work of breathing GI: soft, nontender, nondistended, + BS MS: no deformity or atrophy  Skin: warm and dry, no rash Neuro:  Strength and sensation are intact Psych: euthymic mood, full affect    Recent Labs: 03/12/2016: ALT 18; BUN 12; Creat 0.99; Hemoglobin 13.1; Platelets 246; Potassium 3.9; Sodium 140    Lipid Panel RecentLabs       Lab Results  Component Value Date   CHOL 142 03/12/2016   HDL 34 (L) 03/12/2016   LDLCALC 83 03/12/2016   TRIG 124 03/12/2016           Wt Readings from Last 3 Encounters:  09/05/16 281 lb 4 oz (127.6 kg)  03/12/16 281 lb (127.5 kg)  09/06/15 278 lb 1.9 oz (126.2 kg)       ASSESSMENT AND PLAN:  Coronary artery disease involving native coronary artery of native heart without angina pectoris - Currently with no symptoms of angina. No further workup at this time. Continue current medication regimen.  Hypertension goal BP (blood pressure) < 140/90 Initial blood pressure elevated  We recommended he monitor his blood pressure at home, increase losartan up to 50 mg daily only if systolic pressure consistently more than 140 For now he will cut the 50 mg pill in half  Hyperlipidemia LDL goal <70 Recommended that he continue Crestor 10 mg daily,  Did not tolerate Zetia  Repeat lipid panel today   Status post coronary artery stent placement Denies any symptoms concerning for angina or stent restenosis We'll continue aspirin, aggressive cholesterol management  Obesity, Class I, BMI 30.0-34.9 (see actual BMI) We have encouraged continued exercise, careful diet management in an effort to lose weight.  Obstructive sleep apnea on CPAP    Total encounter time more than 25 minutes  Greater than 50% was spent in counseling and coordination of care with the patient    Disposition:   F/U  12 months   No orders of the defined types were placed in this  encounter.    Signed, Esmond Plants, M.D., Ph.D. 09/05/2016  [place]             Electronically signed by Minna Merritts, MD at 09/05/2016 11:05 AM     Office Visit on 09/05/2016        Detailed Report

## 2017-03-08 MED ORDER — CEFAZOLIN SODIUM-DEXTROSE 2-4 GM/100ML-% IV SOLN
2.0000 g | INTRAVENOUS | Status: AC
  Administered 2017-03-09: 2 g via INTRAVENOUS

## 2017-03-08 MED ORDER — CLINDAMYCIN PHOSPHATE 600 MG/50ML IV SOLN
600.0000 mg | INTRAVENOUS | Status: AC
  Administered 2017-03-09: 600 mg via INTRAVENOUS

## 2017-03-09 ENCOUNTER — Ambulatory Visit: Payer: BC Managed Care – PPO | Admitting: Anesthesiology

## 2017-03-09 ENCOUNTER — Encounter: Payer: Self-pay | Admitting: *Deleted

## 2017-03-09 ENCOUNTER — Ambulatory Visit
Admission: RE | Admit: 2017-03-09 | Discharge: 2017-03-09 | Disposition: A | Discharge status: Home / Self Care | Payer: BC Managed Care – PPO | Source: Ambulatory Visit | Attending: Specialist | Admitting: Specialist

## 2017-03-09 ENCOUNTER — Encounter
Admission: RE | Disposition: A | Discharge status: Home / Self Care | Payer: Self-pay | Source: Ambulatory Visit | Attending: Specialist

## 2017-03-09 DIAGNOSIS — E785 Hyperlipidemia, unspecified: Secondary | ICD-10-CM | POA: Insufficient documentation

## 2017-03-09 DIAGNOSIS — G4730 Sleep apnea, unspecified: Secondary | ICD-10-CM | POA: Diagnosis not present

## 2017-03-09 DIAGNOSIS — I2510 Atherosclerotic heart disease of native coronary artery without angina pectoris: Secondary | ICD-10-CM | POA: Insufficient documentation

## 2017-03-09 DIAGNOSIS — M75102 Unspecified rotator cuff tear or rupture of left shoulder, not specified as traumatic: Secondary | ICD-10-CM | POA: Diagnosis not present

## 2017-03-09 DIAGNOSIS — Z955 Presence of coronary angioplasty implant and graft: Secondary | ICD-10-CM | POA: Insufficient documentation

## 2017-03-09 DIAGNOSIS — K219 Gastro-esophageal reflux disease without esophagitis: Secondary | ICD-10-CM | POA: Diagnosis not present

## 2017-03-09 DIAGNOSIS — I10 Essential (primary) hypertension: Secondary | ICD-10-CM | POA: Diagnosis not present

## 2017-03-09 DIAGNOSIS — E669 Obesity, unspecified: Secondary | ICD-10-CM | POA: Insufficient documentation

## 2017-03-09 HISTORY — PX: LIPOMA EXCISION: SHX5283

## 2017-03-09 HISTORY — PX: SUBACROMIAL DECOMPRESSION: SHX5174

## 2017-03-09 HISTORY — PX: SHOULDER ARTHROSCOPY WITH OPEN ROTATOR CUFF REPAIR: SHX6092

## 2017-03-09 HISTORY — PX: RESECTION DISTAL CLAVICAL: SHX5053

## 2017-03-09 SURGERY — ARTHROSCOPY, SHOULDER WITH REPAIR, ROTATOR CUFF, OPEN
Anesthesia: Regional | Site: Elbow | Laterality: Left

## 2017-03-09 MED ORDER — NEOMYCIN-POLYMYXIN B GU 40-200000 IR SOLN
Status: DC | PRN
  Administered 2017-03-09: 2 mL

## 2017-03-09 MED ORDER — GLYCOPYRROLATE 0.2 MG/ML IJ SOLN
INTRAMUSCULAR | Status: DC | PRN
  Administered 2017-03-09: 0.2 mg via INTRAVENOUS

## 2017-03-09 MED ORDER — MIDAZOLAM HCL 2 MG/2ML IJ SOLN
INTRAMUSCULAR | Status: DC | PRN
  Administered 2017-03-09: 2 mg via INTRAVENOUS

## 2017-03-09 MED ORDER — EPHEDRINE SULFATE 50 MG/ML IJ SOLN
INTRAMUSCULAR | Status: AC
  Filled 2017-03-09: qty 1

## 2017-03-09 MED ORDER — MELOXICAM 15 MG PO TABS: 15.0000 mg | ORAL_TABLET | Freq: Every day | ORAL | 3 refills | Status: DC

## 2017-03-09 MED ORDER — GABAPENTIN 400 MG PO CAPS
ORAL_CAPSULE | ORAL | Status: AC
  Administered 2017-03-09: 400 mg via ORAL
  Filled 2017-03-09: qty 1

## 2017-03-09 MED ORDER — PROMETHAZINE HCL 25 MG/ML IJ SOLN
6.2500 mg | INTRAMUSCULAR | Status: DC | PRN
Start: 2017-03-09 — End: 2017-03-09
  Administered 2017-03-09: 12.5 mg via INTRAVENOUS

## 2017-03-09 MED ORDER — LIDOCAINE HCL (PF) 1 % IJ SOLN
INTRAMUSCULAR | Status: DC | PRN
  Administered 2017-03-09: 3 mL

## 2017-03-09 MED ORDER — SUCCINYLCHOLINE CHLORIDE 20 MG/ML IJ SOLN
INTRAMUSCULAR | Status: DC | PRN
  Administered 2017-03-09: 120 mg via INTRAVENOUS

## 2017-03-09 MED ORDER — BUPIVACAINE-EPINEPHRINE (PF) 0.25% -1:200000 IJ SOLN
INTRAMUSCULAR | Status: AC
  Filled 2017-03-09: qty 30

## 2017-03-09 MED ORDER — FAMOTIDINE 20 MG PO TABS
20.0000 mg | ORAL_TABLET | Freq: Once | ORAL | Status: AC
  Administered 2017-03-09: 20 mg via ORAL

## 2017-03-09 MED ORDER — CLINDAMYCIN PHOSPHATE 600 MG/50ML IV SOLN
INTRAVENOUS | Status: AC
  Filled 2017-03-09: qty 50

## 2017-03-09 MED ORDER — SOD CITRATE-CITRIC ACID 500-334 MG/5ML PO SOLN
30.0000 mL | Freq: Once | ORAL | Status: AC
  Administered 2017-03-09: 30 mL via ORAL
  Filled 2017-03-09: qty 30

## 2017-03-09 MED ORDER — CHLORHEXIDINE GLUCONATE CLOTH 2 % EX PADS
6.0000 | MEDICATED_PAD | Freq: Once | CUTANEOUS | Status: AC
Start: 2017-03-09 — End: 2017-03-09
  Administered 2017-03-09: 6 via TOPICAL

## 2017-03-09 MED ORDER — ALUM & MAG HYDROXIDE-SIMETH 200-200-20 MG/5ML PO SUSP
ORAL | Status: AC
  Administered 2017-03-09: 30 mL via ORAL
  Filled 2017-03-09: qty 30

## 2017-03-09 MED ORDER — CEFAZOLIN SODIUM-DEXTROSE 2-4 GM/100ML-% IV SOLN
INTRAVENOUS | Status: AC
  Filled 2017-03-09: qty 100

## 2017-03-09 MED ORDER — PHENYLEPHRINE HCL 10 MG/ML IJ SOLN
INTRAMUSCULAR | Status: DC | PRN
  Administered 2017-03-09 (×2): 100 ug via INTRAVENOUS
  Administered 2017-03-09: 200 ug via INTRAVENOUS

## 2017-03-09 MED ORDER — HYDROCODONE-ACETAMINOPHEN 7.5-325 MG PO TABS: 1.0000 | ORAL_TABLET | Freq: Four times a day (QID) | ORAL | 0 refills | Status: DC | PRN

## 2017-03-09 MED ORDER — FENTANYL CITRATE (PF) 100 MCG/2ML IJ SOLN: 25.0000 ug | INTRAMUSCULAR | Status: DC | PRN

## 2017-03-09 MED ORDER — LIDOCAINE HCL (CARDIAC) 20 MG/ML IV SOLN
INTRAVENOUS | Status: DC | PRN
Start: 2017-03-09 — End: 2017-03-09
  Administered 2017-03-09: 50 mg via INTRAVENOUS

## 2017-03-09 MED ORDER — ROPIVACAINE HCL 5 MG/ML IJ SOLN
INTRAMUSCULAR | Status: DC | PRN
  Administered 2017-03-09: 30 mL via PERINEURAL

## 2017-03-09 MED ORDER — SUGAMMADEX SODIUM 500 MG/5ML IV SOLN
INTRAVENOUS | Status: AC
  Filled 2017-03-09: qty 5

## 2017-03-09 MED ORDER — ROCURONIUM BROMIDE 100 MG/10ML IV SOLN
INTRAVENOUS | Status: DC | PRN
  Administered 2017-03-09: 10 mg via INTRAVENOUS
  Administered 2017-03-09: 30 mg via INTRAVENOUS

## 2017-03-09 MED ORDER — EPHEDRINE SULFATE 50 MG/ML IJ SOLN
INTRAMUSCULAR | Status: DC | PRN
  Administered 2017-03-09 (×3): 10 mg via INTRAVENOUS

## 2017-03-09 MED ORDER — SUGAMMADEX SODIUM 200 MG/2ML IV SOLN
INTRAVENOUS | Status: DC | PRN
  Administered 2017-03-09: 300 mg via INTRAVENOUS

## 2017-03-09 MED ORDER — OXYCODONE HCL 5 MG/5ML PO SOLN: 5.0000 mg | Freq: Once | ORAL | Status: DC | PRN

## 2017-03-09 MED ORDER — OXYCODONE HCL 5 MG PO TABS: 5.0000 mg | ORAL_TABLET | Freq: Once | ORAL | Status: DC | PRN

## 2017-03-09 MED ORDER — NEOMYCIN-POLYMYXIN B GU 40-200000 IR SOLN
Status: AC
  Filled 2017-03-09: qty 2

## 2017-03-09 MED ORDER — BUPIVACAINE-EPINEPHRINE (PF) 0.25% -1:200000 IJ SOLN
INTRAMUSCULAR | Status: DC | PRN
  Administered 2017-03-09: 30 mL via PERINEURAL

## 2017-03-09 MED ORDER — LIDOCAINE HCL (PF) 1 % IJ SOLN
INTRAMUSCULAR | Status: AC
  Filled 2017-03-09: qty 5

## 2017-03-09 MED ORDER — GABAPENTIN 400 MG PO CAPS: 400.0000 mg | ORAL_CAPSULE | Freq: Two times a day (BID) | ORAL | 3 refills | Status: DC

## 2017-03-09 MED ORDER — ROPIVACAINE HCL 5 MG/ML IJ SOLN
INTRAMUSCULAR | Status: AC
  Filled 2017-03-09: qty 30

## 2017-03-09 MED ORDER — EPINEPHRINE PF 1 MG/ML IJ SOLN
INTRAMUSCULAR | Status: DC | PRN
  Administered 2017-03-09: 15 mL

## 2017-03-09 MED ORDER — MIDAZOLAM HCL 2 MG/2ML IJ SOLN
1.0000 mg | Freq: Once | INTRAMUSCULAR | Status: AC
  Administered 2017-03-09: 1 mg via INTRAVENOUS

## 2017-03-09 MED ORDER — ONDANSETRON HCL 4 MG/2ML IJ SOLN
INTRAMUSCULAR | Status: DC | PRN
  Administered 2017-03-09: 4 mg via INTRAVENOUS

## 2017-03-09 MED ORDER — MIDAZOLAM HCL 2 MG/2ML IJ SOLN
INTRAMUSCULAR | Status: AC
  Administered 2017-03-09: 1 mg via INTRAVENOUS
  Filled 2017-03-09: qty 2

## 2017-03-09 MED ORDER — PROMETHAZINE HCL 25 MG/ML IJ SOLN
INTRAMUSCULAR | Status: AC
  Administered 2017-03-09: 12.5 mg via INTRAVENOUS
  Filled 2017-03-09: qty 1

## 2017-03-09 MED ORDER — SODIUM CHLORIDE FLUSH 0.9 % IV SOLN
INTRAVENOUS | Status: AC
  Filled 2017-03-09: qty 10

## 2017-03-09 MED ORDER — MORPHINE SULFATE (PF) 4 MG/ML IV SOLN
INTRAVENOUS | Status: AC
  Filled 2017-03-09: qty 1

## 2017-03-09 MED ORDER — PROPOFOL 10 MG/ML IV BOLUS
INTRAVENOUS | Status: DC | PRN
  Administered 2017-03-09: 200 mg via INTRAVENOUS

## 2017-03-09 MED ORDER — GABAPENTIN 400 MG PO CAPS
400.0000 mg | ORAL_CAPSULE | ORAL | Status: AC
  Administered 2017-03-09: 400 mg via ORAL

## 2017-03-09 MED ORDER — EPINEPHRINE 30 MG/30ML IJ SOLN
INTRAMUSCULAR | Status: AC
  Filled 2017-03-09: qty 1

## 2017-03-09 MED ORDER — FENTANYL CITRATE (PF) 100 MCG/2ML IJ SOLN
INTRAMUSCULAR | Status: AC
  Administered 2017-03-09: 50 ug via INTRAVENOUS
  Filled 2017-03-09: qty 2

## 2017-03-09 MED ORDER — GLYCOPYRROLATE 0.2 MG/ML IJ SOLN
INTRAMUSCULAR | Status: AC
  Filled 2017-03-09: qty 1

## 2017-03-09 MED ORDER — MORPHINE SULFATE (PF) 4 MG/ML IV SOLN
INTRAVENOUS | Status: DC | PRN
  Administered 2017-03-09: 4 mg via INTRAVENOUS

## 2017-03-09 MED ORDER — ALUM & MAG HYDROXIDE-SIMETH 200-200-20 MG/5ML PO SUSP
30.0000 mL | Freq: Once | ORAL | Status: AC
  Administered 2017-03-09: 30 mL via ORAL

## 2017-03-09 MED ORDER — LACTATED RINGERS IV SOLN
INTRAVENOUS | Status: DC
  Administered 2017-03-09: 12:00:00 via INTRAVENOUS

## 2017-03-09 MED ORDER — MEPERIDINE HCL 50 MG/ML IJ SOLN: 6.2500 mg | INTRAMUSCULAR | Status: DC | PRN

## 2017-03-09 MED ORDER — SODIUM CHLORIDE 0.9 % IV SOLN
INTRAVENOUS | Status: DC | PRN
  Administered 2017-03-09: 30 ug/min via INTRAVENOUS

## 2017-03-09 MED ORDER — FENTANYL CITRATE (PF) 100 MCG/2ML IJ SOLN
50.0000 ug | Freq: Once | INTRAMUSCULAR | Status: AC
  Administered 2017-03-09: 50 ug via INTRAVENOUS

## 2017-03-09 MED ORDER — FENTANYL CITRATE (PF) 100 MCG/2ML IJ SOLN
INTRAMUSCULAR | Status: DC | PRN
  Administered 2017-03-09: 100 ug via INTRAVENOUS
  Administered 2017-03-09 (×3): 50 ug via INTRAVENOUS

## 2017-03-09 MED ORDER — HYDROCODONE-ACETAMINOPHEN 7.5-325 MG PO TABS: 1.0000 | ORAL_TABLET | Freq: Once | ORAL | Status: DC

## 2017-03-09 MED ORDER — FAMOTIDINE 20 MG PO TABS
ORAL_TABLET | ORAL | Status: AC
  Administered 2017-03-09: 20 mg via ORAL
  Filled 2017-03-09: qty 1

## 2017-03-09 SURGICAL SUPPLY — 55 items
ADAPTER IRRIG TUBE 2 SPIKE SOL (ADAPTER) ×4 IMPLANT
ADPR TBG 2 SPK PMP STRL ASCP (ADAPTER) ×2
ANCH SUT 5.5 KNTLS PEEK (Orthopedic Implant) ×2 IMPLANT
ANCHOR SUT 5.5 MULTIFIX (Orthopedic Implant) ×5 IMPLANT
ANCHOR SUT 5.5MM MULTIFIX (Orthopedic Implant) ×1 IMPLANT
BLADE AGGRESSIVE PLUS 4.0 (BLADE) ×4 IMPLANT
BUR AGGRESSIVE+ 5.5 (BURR) ×2 IMPLANT
BUR BR 5.5 12 FLUTE (BURR) ×4 IMPLANT
BUR RADIUS 4.0X18.5 (BURR) ×4 IMPLANT
BUR RADIUS 5.5 (BURR) ×2 IMPLANT
CANNULA 5.75X7 CRYSTAL CLEAR (CANNULA) ×4 IMPLANT
CANNULA 8.5X75 THRED (CANNULA) ×4 IMPLANT
CANNULA PARTIAL THREAD 2X7 (CANNULA) ×4 IMPLANT
CHLORAPREP W/TINT 26ML (MISCELLANEOUS) ×4 IMPLANT
CONNECTOR PERFECT PASSER (CONNECTOR) ×4 IMPLANT
COVER MAYO STAND STRL (DRAPES) ×4 IMPLANT
DRAPE IMP U-DRAPE 54X76 (DRAPES) ×8 IMPLANT
DRAPE SHEET LG 3/4 BI-LAMINATE (DRAPES) IMPLANT
DRAPE STERI 35X30 U-POUCH (DRAPES) ×4 IMPLANT
GAUZE PETRO XEROFOAM 1X8 (MISCELLANEOUS) ×4 IMPLANT
GAUZE SPONGE 4X4 12PLY STRL (GAUZE/BANDAGES/DRESSINGS) ×8 IMPLANT
GLOVE SURG ORTHO 8.0 STRL STRW (GLOVE) ×4 IMPLANT
GOWN STRL REUS W/TWL LRG LVL4 (GOWN DISPOSABLE) ×4 IMPLANT
IV LACTATED RINGER IRRG 3000ML (IV SOLUTION) ×56
IV LR IRRIG 3000ML ARTHROMATIC (IV SOLUTION) ×2 IMPLANT
KIT RM TURNOVER STRD PROC AR (KITS) ×4 IMPLANT
KIT SHOULDER TRACTION (DRAPES) ×4 IMPLANT
MANIFOLD NEPTUNE II (INSTRUMENTS) ×4 IMPLANT
MAT BLUE FLOOR 46X72 FLO (MISCELLANEOUS) ×4 IMPLANT
NDL SAFETY ECLIPSE 18X1.5 (NEEDLE) ×2 IMPLANT
NDL SPNL 18GX3.5 QUINCKE PK (NEEDLE) ×2 IMPLANT
NEEDLE HYPO 18GX1.5 SHARP (NEEDLE) ×4
NEEDLE SPNL 18GX3.5 QUINCKE PK (NEEDLE) ×4 IMPLANT
NS IRRIG 500ML POUR BTL (IV SOLUTION) ×4 IMPLANT
PACK ARTHROSCOPY SHOULDER (MISCELLANEOUS) ×4 IMPLANT
PASSER SUT CAPTURE FIRST (SUTURE) ×6 IMPLANT
SET TUBE SUCT SHAVER OUTFL 24K (TUBING) ×4 IMPLANT
SLING ULTRA II LG (MISCELLANEOUS) ×4 IMPLANT
SOL PREP PVP 2OZ (MISCELLANEOUS) ×4
SOLUTION PREP PVP 2OZ (MISCELLANEOUS) ×2 IMPLANT
SUT ETHILON 3 0 FSLX (SUTURE) ×4 IMPLANT
SUT PDS PLUS 0 (SUTURE) ×2
SUT PDS PLUS AB 0 CT-2 (SUTURE) ×2 IMPLANT
SUT PERFECTPASSER WHITE CART (SUTURE) ×4 IMPLANT
SUT VIC AB 2-0 CT2 27 (SUTURE) ×4 IMPLANT
SUT VICRYL 3-0 27IN (SUTURE) ×4 IMPLANT
SUTURE MAGNUM WIRE 2X48 BLK (SUTURE) ×4 IMPLANT
SYR 20CC LL (SYRINGE) ×4 IMPLANT
SYR 30ML LL (SYRINGE) ×4 IMPLANT
SYR 50ML LL SCALE MARK (SYRINGE) ×4 IMPLANT
TUBING ARTHRO INFLOW-ONLY STRL (TUBING) ×4 IMPLANT
TUBING CONNECTING 10 (TUBING) ×4 IMPLANT
TUBING CONNECTING 10' (TUBING) ×2
WAND COBLATION FLOW 50 (SURGICAL WAND) IMPLANT
WAND HAND CNTRL MULTIVAC 90 (MISCELLANEOUS) ×4 IMPLANT

## 2017-03-09 NOTE — H&P (Signed)
THE PATIENT WAS SEEN PRIOR TO SURGERY TODAY.  HISTORY, ALLERGIES, HOME MEDICATIONS AND OPERATIVE PROCEDURE WERE REVIEWED. RISKS AND BENEFITS OF SURGERY DISCUSSED WITH PATIENT AGAIN.  NO CHANGES FROM INITIAL HISTORY AND PHYSICAL NOTED.   PATIENT ALSO REQUESTS THAT SMALL MASS ON LEFT ELBOW BE REMOVED.

## 2017-03-09 NOTE — Progress Notes (Signed)
Dr. Andree Elk viewed the EKG and stated that it looked good. Pt having some heartburn now and some centeralized chest pressure. Dr. Andree Elk gave verbal orders. See MAR. Dean Sullivan E 7:45 PM 03/09/2017

## 2017-03-09 NOTE — Anesthesia Procedure Notes (Signed)
Anesthesia Regional Block: Interscalene brachial plexus block   Pre-Anesthetic Checklist: ,, timeout performed, Correct Patient, Correct Site, Correct Laterality, Correct Procedure, Correct Position, site marked, Risks and benefits discussed,  Surgical consent,  Pre-op evaluation,  At surgeon's request and post-op pain management  Laterality: Left  Prep: chloraprep       Needles:  Injection technique: Single-shot  Needle Type: Stimiplex     Needle Length: 10cm  Needle Gauge: 20     Additional Needles:   Procedures:,,,, ultrasound used (permanent image in chart),,,,  Narrative:  Start time: 03/09/2017 1:40 PM End time: 03/09/2017 1:47 PM Injection made incrementally with aspirations every 5 mL.  Performed by: Personally  Anesthesiologist: Emmie Niemann, MD  Additional Notes: Functioning IV was confirmed and monitors were applied.  A Stimuplex needle was used. Sterile prep and drape,hand hygiene and sterile gloves were used.  Negative aspiration and negative test dose prior to incremental administration of local anesthetic. The patient tolerated the procedure well.

## 2017-03-09 NOTE — Op Note (Signed)
03/09/2017  5:09 PM  PATIENT:  Dean Sullivan    PRE-OPERATIVE DIAGNOSIS:  M75.102 Unsp rotatr cuff tear/ruptr of left shoulder not trauma  POST-OPERATIVE DIAGNOSIS:  Same  PROCEDURE: 1)  SHOULDER ARTHROSCOPY WITH ARTHROSCOPIC ROTATOR CUFF REPAIR, DISTAL CLAVICLE EXCISION, SUBACROMIAL DECOMPRESSION, BICEPS TENOTOMY  2)  EXCISION DEEP GANGLION CYST LEFT ELBOW  SURGEON:  Cariana Karge E, MD  ASST:  NONE  ANESTHESIA:   General plus interscalene block  PREOPERATIVE INDICATIONS:  Dean Sullivan is a  61 y.o. male with a diagnosis of M75.102 Unsp rotatr cuff tear/ruptr of left shoulder not trauma and had a large mass on the left elbow laterally who failed conservative measures and elected for surgical management.    The risks benefits and alternatives were discussed with the patient preoperatively including but not limited to the risks of infection, bleeding, nerve injury, cardiopulmonary complications, the need for revision surgery, among others, and the patient was willing to proceed.  OPERATIVE IMPLANTS: 1 [employer] Multifix anchor  OPERATIVE FINDINGS: The patient had severe subacromial bursitis and impingement. The anterior acromion was prominent. The ACjoint was arthritic. The glenohumeral joint was intact.  There was a 2 cm tear of the supraspinatus tendon anteriorly with mild retraction.  There was a deep ganglion cyst of the left elbow   OPERATIVE PROCEDURE: The patient was brought to the operating room where satisfactory general endotracheal and interscalene anesthesia were accomplished.The patient was turned into the lateral decubitus position and the shoulder was prepped and draped in a sterile fashion.  A longitudinal incision was made over the mass on the outer aspect of the left elbow.  Dissection was carried out bluntly through subcutaneous tissue around the mass.  Quickly became obvious that it was a ganglion type cyst.  The dissection was carried out completely around  the ganglion and it was amputated at its base deep down at the elbow joint capsule.  The specimen was sent for pathological examination.  The wound was irrigated and then closed with 3-0 Vicryl and the skin was closed with 4-0 nylon.  Arthroscopy was then carried out from a posterior portal with accessory portals laterally and anteriorly. The  joint was examined first. The above findings were encountered. The motorized shaver was introduced anteriorly and the undersurface of the rotator cuff probed and lightly debrided. The biceps tendon was frayed.  The ArthroCare wand was introduced and the biceps tendon was released completely. The labrum was trimmed up with the ArthroCare wand as well. The arthroscope was redirected into the subacromial space. There was severe bursitis which was resected with the motorized shaver and ArthroCare wand. The large bur was introduced from a posterior portal and the anterior acromion was debrided. The undersurface of the clavicle was debrided with the bur which was then reintroduced from an anterior portal and the remaining distal clavicle completely excised.  The tear of the supraspinatus was visualized from the posterior and lateral views.  The tuberosity was trimmed up with a bur.  2 Magnum wire sutures were introduced into the cuff.  A pilot hole was made lateral to the tuberosity and the 4 strands of suture were passed through the multi-fix anchor.  This was then introduced into the pilot hole and the sutures pulled snugly.  Traction was dropped to 5 pounds during this procedure.  The multi fix anchor was introduced fully pulling the rotator cuff over nicely to the lateral aspect of the tuberosity.  Sutures were cut.  The repair looked to be very  satisfactory.  Final debridement was carried out with the motorized shaver and the ArthroCare wand. The joint was flushed and the stab wounds and closed with 3-0 nylon suture. 1/4% Marcaine with morphine was injected into the shoulder.  Sponge and needle counts were correct.  A dry sterile dressing was applied to the elbow wound.  The dry sterile dressing and was applied to the shoulder wound along with a padded sling. Patient was awakened and taken recovery in good condition.   Basil Dess.D.

## 2017-03-09 NOTE — Discharge Instructions (Signed)

## 2017-03-09 NOTE — Transfer of Care (Signed)
Immediate Anesthesia Transfer of Care Note  Patient: Dean Sullivan  Procedure(s) Performed: SHOULDER ARTHROSCOPY WITH OPEN ROTATOR CUFF REPAIR (Left ) EXCISION LIPOMA (Left Elbow)  Patient Location: PACU  Anesthesia Type:General  Level of Consciousness: awake  Airway & Oxygen Therapy: Patient Spontanous Breathing and Patient connected to face mask oxygen  Post-op Assessment: Report given to RN and Post -op Vital signs reviewed and stable  Post vital signs: Reviewed  Last Vitals:  Vitals:   03/09/17 1424 03/09/17 1717  BP: 111/63 102/70  Pulse: 79 81  Resp: 15 18  Temp:    SpO2: 100% 98%    Last Pain:  Vitals:   03/09/17 1354  TempSrc:   PainSc: 0-No pain         Complications: No apparent anesthesia complications

## 2017-03-09 NOTE — Anesthesia Preprocedure Evaluation (Signed)
Anesthesia Evaluation  Patient identified by MRN, date of birth, ID band Patient awake    Reviewed: Allergy & Precautions, NPO status , Patient's Chart, lab work & pertinent test results  History of Anesthesia Complications Negative for: history of anesthetic complications  Airway Mallampati: III  TM Distance: >3 FB Neck ROM: Full    Dental no notable dental hx.    Pulmonary sleep apnea and Continuous Positive Airway Pressure Ventilation , neg COPD,    breath sounds clear to auscultation- rhonchi (-) wheezing      Cardiovascular hypertension, Pt. on medications + CAD and + Cardiac Stents  (-) Past MI and (-) CABG  Rhythm:Regular Rate:Normal - Systolic murmurs and - Diastolic murmurs    Neuro/Psych negative neurological ROS  negative psych ROS   GI/Hepatic Neg liver ROS, GERD  ,  Endo/Other  negative endocrine ROSneg diabetes  Renal/GU negative Renal ROS     Musculoskeletal negative musculoskeletal ROS (+)   Abdominal (+) + obese,   Peds  Hematology negative hematology ROS (+)   Anesthesia Other Findings Past Medical History: No date: Allergy No date: Blockage of coronary artery bypass graft     Comment:  patient stated that he had blockages - stents were               inserted. No date: Coronary arteriosclerosis in native artery No date: GERD (gastroesophageal reflux disease)     Comment:  occ No date: Glaucoma No date: Hearing loss of right ear     Comment:  patient was told he had slight hearing loss No date: History of kidney stones     Comment:  h/o No date: History of trigger finger     Comment:  bilateral, but mostly in right thumb No date: Hyperlipidemia No date: Hypertension No date: Irregular heart beat 09/10/2016: Prediabetes No date: Sleep apnea     Comment:  cpap   Reproductive/Obstetrics                             Anesthesia Physical Anesthesia Plan  ASA:  III  Anesthesia Plan: General   Post-op Pain Management:  Regional for Post-op pain   Induction: Intravenous  PONV Risk Score and Plan: 1 and Ondansetron  Airway Management Planned: Oral ETT  Additional Equipment:   Intra-op Plan:   Post-operative Plan: Extubation in OR  Informed Consent: I have reviewed the patients History and Physical, chart, labs and discussed the procedure including the risks, benefits and alternatives for the proposed anesthesia with the patient or authorized representative who has indicated his/her understanding and acceptance.   Dental advisory given  Plan Discussed with: CRNA and Anesthesiologist  Anesthesia Plan Comments:         Anesthesia Quick Evaluation

## 2017-03-09 NOTE — Anesthesia Post-op Follow-up Note (Signed)
Anesthesia QCDR form completed.        

## 2017-03-09 NOTE — Progress Notes (Signed)
Dr. Andree Elk called because pt c/o some heart burn and chest pressure. Order for EKG placed. Lachanda Buczek E 6:44 PM 03/09/2017

## 2017-03-09 NOTE — Anesthesia Procedure Notes (Signed)
Procedure Name: Intubation Performed by: Rolla Plate, CRNA Pre-anesthesia Checklist: Patient identified, Patient being monitored, Timeout performed, Emergency Drugs available and Suction available Patient Re-evaluated:Patient Re-evaluated prior to induction Oxygen Delivery Method: Circle system utilized Preoxygenation: Pre-oxygenation with 100% oxygen Induction Type: IV induction and Rapid sequence Ventilation: Two handed mask ventilation required Laryngoscope Size: 4 and McGraph Grade View: Grade II Tube type: Oral Tube size: 7.5 mm Number of attempts: 1 Airway Equipment and Method: Stylet and Video-laryngoscopy Placement Confirmation: ETT inserted through vocal cords under direct vision,  positive ETCO2 and breath sounds checked- equal and bilateral Secured at: 23 cm Tube secured with: Tape Dental Injury: Teeth and Oropharynx as per pre-operative assessment

## 2017-03-10 ENCOUNTER — Encounter: Payer: Self-pay | Admitting: Specialist

## 2017-03-12 ENCOUNTER — Ambulatory Visit: Payer: BC Managed Care – PPO | Admitting: Family Medicine

## 2017-03-12 LAB — SURGICAL PATHOLOGY

## 2017-03-18 ENCOUNTER — Telehealth: Payer: Self-pay | Admitting: Family Medicine

## 2017-03-18 DIAGNOSIS — E669 Obesity, unspecified: Secondary | ICD-10-CM

## 2017-03-18 DIAGNOSIS — E119 Type 2 diabetes mellitus without complications: Secondary | ICD-10-CM

## 2017-03-18 DIAGNOSIS — E1169 Type 2 diabetes mellitus with other specified complication: Secondary | ICD-10-CM | POA: Insufficient documentation

## 2017-03-18 HISTORY — DX: Type 2 diabetes mellitus without complications: E11.9

## 2017-03-18 NOTE — Anesthesia Postprocedure Evaluation (Signed)
Anesthesia Post Note  Patient: Dean Sullivan  Procedure(s) Performed: SHOULDER ARTHROSCOPY WITH OPEN ROTATOR CUFF REPAIR (Left ) EXCISION LIPOMA (Left Elbow) SUBACROMIAL DECOMPRESSION (Left ) RESECTION DISTAL CLAVICAL (Left )  Patient location during evaluation: PACU Anesthesia Type: Regional Level of consciousness: awake and alert Pain management: pain level controlled Vital Signs Assessment: post-procedure vital signs reviewed and stable Respiratory status: spontaneous breathing, nonlabored ventilation, respiratory function stable and patient connected to nasal cannula oxygen Cardiovascular status: blood pressure returned to baseline and stable Postop Assessment: no apparent nausea or vomiting Anesthetic complications: no     Last Vitals:  Vitals:   03/09/17 1950 03/09/17 2020  BP: 134/90 (!) 142/83  Pulse: 82 82  Resp: (!) 23 20  Temp:  36.7 C  SpO2: 96% 98%    Last Pain:  Vitals:   03/12/17 1353  TempSrc:   PainSc: 0-No pain                 Molli Barrows

## 2017-03-18 NOTE — Telephone Encounter (Signed)
Please see the lab result note for the A1c; I sent patient a message letting him know that I wanted to see him back in the office because his blood sugar is high He canceled his Dec 20th appt Please urge him to schedule an appt in the next week or two; we need to discuss what this test means and start him on treatment Thank you

## 2017-03-18 NOTE — Telephone Encounter (Signed)
Called pt informed him of the information in recent result note. Pt gave verbal understanding, states that he cannot schedule appt at this time as he is out of town however he states that he will call us back as soon as he is gets back to schedule.

## 2017-03-27 ENCOUNTER — Encounter: Payer: Self-pay | Admitting: Family Medicine

## 2017-03-31 ENCOUNTER — Encounter: Payer: Self-pay | Admitting: Family Medicine

## 2017-03-31 ENCOUNTER — Ambulatory Visit: Payer: BC Managed Care – PPO | Admitting: Family Medicine

## 2017-03-31 VITALS — BP 122/68 | HR 111 | Temp 99.0°F | Resp 16 | Wt 273.5 lb

## 2017-03-31 DIAGNOSIS — R509 Fever, unspecified: Secondary | ICD-10-CM | POA: Diagnosis not present

## 2017-03-31 DIAGNOSIS — E6609 Other obesity due to excess calories: Secondary | ICD-10-CM

## 2017-03-31 DIAGNOSIS — J180 Bronchopneumonia, unspecified organism: Secondary | ICD-10-CM | POA: Diagnosis not present

## 2017-03-31 DIAGNOSIS — Z6833 Body mass index (BMI) 33.0-33.9, adult: Secondary | ICD-10-CM | POA: Diagnosis not present

## 2017-03-31 DIAGNOSIS — E119 Type 2 diabetes mellitus without complications: Secondary | ICD-10-CM

## 2017-03-31 LAB — GLUCOSE, POCT (MANUAL RESULT ENTRY): POC Glucose: 103 mg/dl — AB (ref 70–99)

## 2017-03-31 LAB — POCT INFLUENZA A/B
Influenza A, POC: NEGATIVE
Influenza B, POC: NEGATIVE

## 2017-03-31 LAB — POCT GLYCOSYLATED HEMOGLOBIN (HGB A1C): HEMOGLOBIN A1C: 6.7

## 2017-03-31 MED ORDER — BENZONATATE 100 MG PO CAPS: 100.0000 mg | ORAL_CAPSULE | Freq: Three times a day (TID) | ORAL | 0 refills | Status: DC | PRN

## 2017-03-31 MED ORDER — DOXYCYCLINE HYCLATE 100 MG PO TABS: 100.0000 mg | ORAL_TABLET | Freq: Two times a day (BID) | ORAL | 0 refills | Status: DC

## 2017-03-31 NOTE — Progress Notes (Signed)
poct

## 2017-03-31 NOTE — Patient Instructions (Addendum)
Start the antibiotics Please do eat yogurt or kimchi or take a probiotic daily for the next month We want to replace the healthy germs in the gut If you notice foul, watery diarrhea in the next two months, schedule an appointment RIGHT AWAY or go to an urgent care or the emergency room if a holiday or over a weekend Try vitamin C (orange juice if not diabetic or vitamin C tablets) and drink green tea to help your immune system during your illness Get plenty of rest and hydration  Diabetes Mellitus and Standards of Medical Care Managing diabetes (diabetes mellitus) can be complicated. Your diabetes treatment may be managed by a team of health care providers, including:  A diet and nutrition specialist (registered dietitian).  A nurse.  A certified diabetes educator (CDE).  A diabetes specialist (endocrinologist).  An eye doctor.  A primary care provider.  A dentist.  Your health care providers follow a schedule in order to help you get the best quality of care. The following schedule is a general guideline for your diabetes management plan. Your health care providers may also give you more specific instructions. HbA1c ( hemoglobin A1c) test This test provides information about blood sugar (glucose) control over the previous 2-3 months. It is used to check whether your diabetes management plan needs to be adjusted.  If you are meeting your treatment goals, this test is done at least 2 times a year.  If you are not meeting treatment goals or if your treatment goals have changed, this test is done 4 times a year.  Blood pressure test  This test is done at every routine medical visit. For most people, the goal is less than 130/80. Ask your health care provider what your goal blood pressure should be. Dental and eye exams  Visit your dentist two times a year.  If you have type 1 diabetes, get an eye exam 3-5 years after you are diagnosed, and then once a year after your first  exam. ? If you were diagnosed with type 1 diabetes as a child, get an eye exam when you are age 29 or older and have had diabetes for 3-5 years. After the first exam, you should get an eye exam once a year.  If you have type 2 diabetes, have an eye exam as soon as you are diagnosed, and then once a year after your first exam. Foot care exam  Visual foot exams are done at every routine medical visit. The exams check for cuts, bruises, redness, blisters, sores, or other problems with the feet.  A complete foot exam is done by your health care provider once a year. This exam includes an inspection of the structure and skin of your feet, and a check of the pulses and sensation in your feet. ? Type 1 diabetes: Get your first exam 3-5 years after diagnosis. ? Type 2 diabetes: Get your first exam as soon as you are diagnosed.  Check your feet every day for cuts, bruises, redness, blisters, or sores. If you have any of these or other problems that are not healing, contact your health care provider. Kidney function test ( urine microalbumin)  This test is done once a year. ? Type 1 diabetes: Get your first test 5 years after diagnosis. ? Type 2 diabetes: Get your first test as soon as you are diagnosed.  If you have chronic kidney disease (CKD), get a serum creatinine and estimated glomerular filtration rate (eGFR) test once a year.  Lipid profile (cholesterol, HDL, LDL, triglycerides)  This test should be done when you are diagnosed with diabetes, and every 5 years after the first test. If you are on medicines to lower your cholesterol, you may need to get this test done every year. ? The goal for LDL is less than 100 mg/dL (5.5 mmol/L). If you are at high risk, the goal is less than 70 mg/dL (3.9 mmol/L). ? The goal for HDL is 40 mg/dL (2.2 mmol/L) for men and 50 mg/dL(2.8 mmol/L) for women. An HDL cholesterol of 60 mg/dL (3.3 mmol/L) or higher gives some protection against heart disease. ? The  goal for triglycerides is less than 150 mg/dL (8.3 mmol/L). Immunizations  The yearly flu (influenza) vaccine is recommended for everyone 6 months or older who has diabetes.  The pneumonia (pneumococcal) vaccine is recommended for everyone 2 years or older who has diabetes. If you are 18 or older, you may get the pneumonia vaccine as a series of two separate shots.  The hepatitis B vaccine is recommended for adults shortly after they have been diagnosed with diabetes.  The Tdap (tetanus, diphtheria, and pertussis) vaccine should be given: ? According to normal childhood vaccination schedules, for children. ? Every 10 years, for adults who have diabetes.  The shingles vaccine is recommended for people who have had chicken pox and are 50 years or older. Mental and emotional health  Screening for symptoms of eating disorders, anxiety, and depression is recommended at the time of diagnosis and afterward as needed. If your screening shows that you have symptoms (you have a positive screening result), you may need further evaluation and be referred to a mental health care provider. Diabetes self-management education  Education about how to manage your diabetes is recommended at diagnosis and ongoing as needed. Treatment plan  Your treatment plan will be reviewed at every medical visit. Summary  Managing diabetes (diabetes mellitus) can be complicated. Your diabetes treatment may be managed by a team of health care providers.  Your health care providers follow a schedule in order to help you get the best quality of care.  Standards of care including having regular physical exams, blood tests, blood pressure monitoring, immunizations, screening tests, and education about how to manage your diabetes.  Your health care providers may also give you more specific instructions based on your individual health. This information is not intended to replace advice given to you by your health care  provider. Make sure you discuss any questions you have with your health care provider. Document Released: 01/05/2009 Document Revised: 12/07/2015 Document Reviewed: 12/07/2015 Elsevier Interactive Patient Education  2018 [place] With Diabetes Diabetes (type 1 diabetes mellitus or type 2 diabetes mellitus) is a condition in which the body does not have enough of a hormone called insulin, or the body does not respond properly to insulin. Normally, insulin allows sugars (glucose) to enter cells in the body. The cells use glucose for energy. With diabetes, extra glucose builds up in the blood instead of going into cells, which results in high blood glucose (hyperglycemia). How to manage lifestyle changes Managing diabetes includes medical treatments as well as lifestyle changes. If diabetes is not managed well, serious physical and emotional complications can occur. Taking good care of yourself means that you are responsible for:  Monitoring glucose regularly.  Eating a healthy diet.  Exercising regularly.  Meeting with health care providers.  Taking medicines as directed.  Some people may feel a lot of stress  about managing their diabetes. This is known as emotional distress, and it is very common. Living with diabetes can place you at risk for emotional distress, depression, or anxiety. These disorders can be confusing and can make diabetes management more difficult. How to recognize stress Emotional distress Symptoms of emotional distress include:  Anger about having a diagnosis of diabetes.  Fear or frustration about your diagnosis and the changes you need to make to manage the condition.  Being overly worried about the care that you need or the cost of the care you need.  Feeling like you caused your condition by doing something wrong.  Fear of unpredictable situations, like low or high blood glucose.  Feeling judged by your health care providers.  Feeling very  alone with the disease.  Getting too tired or "burned out" with the demands of daily care.  Depression Having diabetes means that you are at a higher risk for depression. Having depression also means that you are at a higher risk for diabetes. Your health care provider may test (screen) you for symptoms of depression. It is important to recognize depression symptoms and to start treatment for it soon after it is diagnosed. The following are some symptoms of depression:  Loss of interest in things that you used to enjoy.  Trouble sleeping, or often waking up early and not being able to get back to sleep.  A change in appetite.  Feeling tired most of the day.  Feeling nervous and anxious.  Feeling guilty and worrying that you are a burden to others.  Feeling depressed more often than you do not feel that way.  Thoughts of hurting yourself or feeling that you want to die.  If you have any of these symptoms for 2 weeks or longer, reach out to a health care provider. Where to find support  Ask your health care provider to recommend a therapist who understands both depression and diabetes.  Search for information and support from the American Diabetes Association: www.diabetes.org  Find a certified diabetes educator and make an appointment through [place] [employer] Diabetes Educators: www.diabeteseducator.org Follow these instructions at home: Managing emotional distress The following are some ways to manage emotional distress:  Talk with your health care provider or certified diabetes educator. Consider working with a counselor or therapist.  Learn as much as you can about diabetes and its treatment. Meet with a certified diabetes educator or take a class to learn how to manage your condition.  Keep a journal of your thoughts and concerns.  Accept that some things are out of your control.  Talk with other people who have diabetes. It can help to talk with others about the  emotional distress that you feel.  Find ways to manage stress that work for you. These may include art or music therapy, exercise, meditation, and hobbies.  Seek support from spiritual leaders, family, and friends.  General instructions  Follow your diabetes management plan.  Keep all follow-up visits as told by your health care provider. This is important. Get help right away if:  You have thoughts about hurting yourself or others. If you ever feel like you may hurt yourself or others, or have thoughts about taking your own life, get help right away. You can go to your nearest emergency department or call:  Your local emergency services (911 in the [place]  A suicide crisis helpline, such as the [facility] at 331-239-4659. This is open 24 hours a day.  Summary  Diabetes (type 1 diabetes mellitus or type 2 diabetes mellitus) is a condition in which the body does not have enough of a hormone called insulin, or the body does not respond properly to insulin.  Living with diabetes puts you at risk for medical issues, and it also puts you at risk for emotional issues such as emotional distress, depression, and anxiety.  Recognizing the symptoms of emotional distress and depression may help you avoid problems with your diabetes control. It is important to start treatment for emotional distress and depression soon after they are diagnosed.  Having diabetes means that you are at a higher risk for depression. Ask your health care provider to recommend a therapist who understands both depression and diabetes.  If you experience symptoms of emotional distress or depression, it is important to discuss this with your health care provider, certified diabetes educator, or therapist. This information is not intended to replace advice given to you by your health care provider. Make sure you discuss any questions you have with your health care provider. Document Released:  07/24/2016 Document Revised: 07/24/2016 Document Reviewed: 07/24/2016 Elsevier Interactive Patient Education  2018 [employer].

## 2017-03-31 NOTE — Assessment & Plan Note (Signed)
Praised weight loss; encouraged ongoing weight loss

## 2017-03-31 NOTE — Progress Notes (Signed)
BP 122/68   Pulse (!) 111   Temp 99 F (37.2 C) (Oral)   Resp 16   Wt 273 lb 8 oz (124.1 kg)   SpO2 99%   BMI 33.73 kg/m    Subjective:    Patient ID: Dean Sullivan, male    DOB: Sep 01, 1955, 62 y.o.   MRN: [id number]  HPI: Dean Sullivan is a 62 y.o. male  Chief Complaint  Patient presents with  . URI    onset 1 week sypmtoms include fever, chest congestion, headache, cough, sore throat runny nose and chills  . Diabetes    HPI Got sick Sat before last; started congestion and drainage Moved into the chest coughing; 102 fever No rash He has been taking mucinex, to help with the cough; nothing comes out No pneumonia Rib pain along both sides No SHOB or trouble breathing Open window felt something hit him Had been in an emergency department and was next to a man who was coughing  Had rotator cuff surgery; hurts; 3 out of 10 pain  He was found to have brand new diabetes on his last set of labs; we have been trying to bring him in for an appointment; he does not want medicine; he wants to try to control this with diet; he does have urinary frequency  Depression screen [facility] 2/9 03/31/2017 02/19/2017 09/10/2016 03/12/2016 02/12/2015  Decreased Interest 0 0 0 0 0  Down, Depressed, Hopeless 0 0 0 0 0  PHQ - 2 Score 0 0 0 0 0   Relevant past medical, surgical, family and social history reviewed Past Medical History:  Diagnosis Date  . Allergy   . Blockage of coronary artery bypass graft    patient stated that he had blockages - stents were inserted.  . Coronary arteriosclerosis in native artery   . Diabetes mellitus without complication ([place])   . GERD (gastroesophageal reflux disease)    occ  . Glaucoma   . Hearing loss of right ear    patient was told he had slight hearing loss  . History of kidney stones    h/o  . History of trigger finger    bilateral, but mostly in right thumb  . Hyperlipidemia   . Hypertension   . Irregular heart beat   . Sleep apnea    cpap     Past Surgical History:  Procedure Laterality Date  . CARDIAC CATHETERIZATION     x2 stents [place]  2012   2  . LIPOMA EXCISION Left 03/09/2017   Procedure: EXCISION LIPOMA;  Surgeon: Earnestine Leys, MD;  Location: [facility] ORS;  Service: Orthopedics;  Laterality: Left;  . RESECTION DISTAL CLAVICAL Left 03/09/2017   Procedure: RESECTION DISTAL CLAVICAL;  Surgeon: Earnestine Leys, MD;  Location: [facility] OR[facility];  Service: Orthopedics;  Laterality: Left;  . SHOULDER ARTHROSCOPY WITH OPEN ROTATOR CUFF REPAIR Left 03/09/2017   Procedure: SHOULDER ARTHROSCOPY WITH OPEN ROTATOR CUFF REPAIR;  Surgeon: Earnestine Leys, MD;  Location: [facility] OR[facility];  Service: Orthopedics;  Laterality: Left;  . SUBACROMIAL DECOMPRESSION Left 03/09/2017   Procedure: SUBACROMIAL DECOMPRESSION;  Surgeon: Earnestine Leys, MD;  Location: [facility] OR[facility];  Service: Orthopedics;  Laterality: Left;  BICEPS TENOTOMY   Family History  Problem Relation Age of Onset  . Diabetes Mother        adult onset - geriatric  . Alcohol abuse Father   . Cancer Father        prostate  . Stroke Father  mini strokes  . Heart Problems Father   . Other Maternal Aunt        born with her lung outside of her body; lived until 45.  . Arthritis Maternal Grandmother    Social History   Tobacco Use  . Smoking status: Never Smoker  . Smokeless tobacco: Never Used  Substance Use Topics  . Alcohol use: Yes    Alcohol/week: 0.0 oz    Comment: a glass of wine/beer  . Drug use: No    Interim medical history since last visit reviewed. Allergies and medications reviewed  Review of Systems Per HPI unless specifically indicated above     Objective:    BP 122/68   Pulse (!) 111   Temp 99 F (37.2 C) (Oral)   Resp 16   Wt 273 lb 8 oz (124.1 kg)   SpO2 99%   BMI 33.73 kg/m   Wt Readings from Last 3 Encounters:  03/31/17 273 lb 8 oz (124.1 kg)  02/19/17 282 lb 6.4 oz (128.1 kg)  09/10/16 275 lb 6.4 oz  (124.9 kg)    Physical Exam  Constitutional: He appears well-developed and well-nourished. No distress.  Weight loss noted  Eyes: No scleral icterus.  Cardiovascular: Regular rhythm.  No extrasystoles are present. Tachycardia present.  Pulmonary/Chest: Effort normal. No respiratory distress. He has no wheezes. He has rhonchi (scattered on the left).  Neurological: He is alert.  Skin: He is not diaphoretic. No pallor.  Psychiatric: He has a normal mood and affect.   Diabetic Foot Form - Detailed   Diabetic Foot Exam - detailed Diabetic Foot exam was performed with the following findings:  Yes 03/31/2017  3:48 PM  Visual Foot Exam completed.:  Yes  Pulse Foot Exam completed.:  Yes  Right Dorsalis Pedis:  Present Left Dorsalis Pedis:  Present  Sensory Foot Exam Completed.:  Yes Semmes-Weinstein Monofilament Test R Site 1-Great Toe:  Pos L Site 1-Great Toe:  Pos        Results for orders placed or performed in visit on 03/31/17  POCT Glucose (CBG)  Result Value Ref Range   POC Glucose 103 (A) 70 - 99 mg/dl  POCT HgB A1C  Result Value Ref Range   Hemoglobin A1C 6.7   POCT Influenza A/B  Result Value Ref Range   Influenza A, POC Negative Negative   Influenza B, POC Negative Negative      Assessment & Plan:   Problem List Items Addressed This Visit      Endocrine   Type 2 diabetes mellitus ([place]) - Primary    Repeat A1c is still in diabetes range; return in 2 weeks for visit for brand new diabetes; foot exam by MD today; patient wishes to not take medicine and wants to try diet and exercise and weight loss      Relevant Orders   POCT Glucose (CBG) (Completed)   POCT HgB A1C (Completed)     Other   Obesity    Praised weight loss; encouraged ongoing weight loss       Other Visit Diagnoses    Fever, unspecified fever cause       flu test negative; suspect bronchopneumonia; to ER if worse   Relevant Orders   POCT Influenza A/B (Completed)   Bronchopneumonia        antibiotics; discussed risk of diminished deep breathing after his surgery; reasons to seek immediate care reviewed; C diff precautions   Relevant Medications   doxycycline (VIBRA-TABS) 100 MG  tablet   benzonatate (TESSALON PERLES) 100 MG capsule       Follow up plan: Return in about 2 weeks (around 04/14/2017) for follow-up visit with Dr. Sanda Klein for diabetes.  An after-visit summary was printed and given to the patient at [place].  Please see the patient instructions which may contain other information and recommendations beyond what is mentioned above in the assessment and plan.  Meds ordered this encounter  Medications  . doxycycline (VIBRA-TABS) 100 MG tablet    Sig: Take 1 tablet (100 mg total) by mouth 2 (two) times daily.    Dispense:  20 tablet    Refill:  0  . benzonatate (TESSALON PERLES) 100 MG capsule    Sig: Take 1 capsule (100 mg total) by mouth every 8 (eight) hours as needed for cough.    Dispense:  30 capsule    Refill:  0    Orders Placed This Encounter  Procedures  . POCT Glucose (CBG)  . POCT HgB A1C  . POCT Influenza A/B

## 2017-03-31 NOTE — Assessment & Plan Note (Addendum)
Repeat A1c is still in diabetes range; return in 2 weeks for visit for brand new diabetes; foot exam by MD today; patient wishes to not take medicine and wants to try diet and exercise and weight loss

## 2017-04-06 ENCOUNTER — Telehealth: Payer: Self-pay | Admitting: Family Medicine

## 2017-04-06 MED ORDER — PROMETHAZINE-DM 6.25-15 MG/5ML PO SYRP
5.0000 mL | ORAL_SOLUTION | Freq: Four times a day (QID) | ORAL | 0 refills | Status: DC | PRN
Start: 2017-04-06 — End: 2017-04-14

## 2017-04-06 NOTE — Telephone Encounter (Signed)
Copied from [place] [id number]. Topic: Quick Communication - See Telephone Encounter >> Apr 06, 2017 10:03 AM Ether Griffins B wrote: CRM for notification. See Telephone encounter for:  Pt still taking antibiotics and cough pills but is wondering if something stronger can be called in for the cough. Pt no longer has a fever.  04/06/17.

## 2017-04-06 NOTE — Telephone Encounter (Signed)
Phenergan DM sent in; hope that helps

## 2017-04-06 NOTE — Telephone Encounter (Signed)
Called pt informed him that cough medication has been sent in for him, advised him to d/c tessalon perles and begin promethazine DM. Pt gave verbal understanding.

## 2017-04-06 NOTE — Telephone Encounter (Signed)
Please advise. Thanks.  

## 2017-04-14 ENCOUNTER — Encounter: Payer: Self-pay | Admitting: Family Medicine

## 2017-04-14 ENCOUNTER — Ambulatory Visit: Payer: BC Managed Care – PPO | Admitting: Family Medicine

## 2017-04-14 DIAGNOSIS — I2510 Atherosclerotic heart disease of native coronary artery without angina pectoris: Secondary | ICD-10-CM

## 2017-04-14 DIAGNOSIS — I10 Essential (primary) hypertension: Secondary | ICD-10-CM | POA: Diagnosis not present

## 2017-04-14 DIAGNOSIS — E6609 Other obesity due to excess calories: Secondary | ICD-10-CM | POA: Diagnosis not present

## 2017-04-14 DIAGNOSIS — E119 Type 2 diabetes mellitus without complications: Secondary | ICD-10-CM | POA: Diagnosis not present

## 2017-04-14 DIAGNOSIS — Z6833 Body mass index (BMI) 33.0-33.9, adult: Secondary | ICD-10-CM

## 2017-04-14 NOTE — Assessment & Plan Note (Signed)
On aspirin, statin, repatha; seeing cardiologist

## 2017-04-14 NOTE — Assessment & Plan Note (Addendum)
Refer to diabetic educator for classes; foot exam by MD; explained this increases risk of MI and we will work on addressing factors

## 2017-04-14 NOTE — Progress Notes (Signed)
BP 120/78   Pulse 97   Temp 98.6 F (37 C) (Oral)   Resp 14   Wt 271 lb 6.4 oz (123.1 kg)   SpO2 98%   BMI 33.47 kg/m    Subjective:    Patient ID: Dean Sullivan, male    DOB: Mar 03, 1956, 62 y.o.   MRN: [id number]  HPI: Dean Sullivan is a 62 y.o. male  Chief Complaint  Patient presents with  . Follow-up  . Diabetes    HPI Patient is here for follow-up He has type 2 diabetes, new diagnosis; prediabetic for maybe 20 years Drinking 64 ounces of lemonade a day Very strong family hx of diabetes; both sides of the family; father never did get it Mother didn't get diabetes until age 46; was taking pills and doesn't need pills any more; lost weight, 75 years now He has not been as active with shoulder surgery He does not want medicines for diabetes He is motivated and does not want to take another pill; losing weight intentionally Goes to [facility], last exam maybe several months ago; had laser surgery on eyes for high pressures in the eyes; IOP 40s to 20s; thick corneas; has exam with regular eye doctor soon Taking aspirin Whole wheat and almond milk pancakes without eggs  Lab Results  Component Value Date   HGBA1C 6.7 03/31/2017   He is on repatha for his cholesterol; that is managed by his cardiologist; has knocked levels down, total only 77; cardiologist is aware; taking crestor now on just 5 mg  Lab Results  Component Value Date   CHOL 77 (L) 12/16/2016   CHOL 158 09/05/2016   CHOL 142 03/12/2016   Lab Results  Component Value Date   HDL 35 (L) 12/16/2016   HDL 34 (L) 09/05/2016   HDL 34 (L) 03/12/2016   Lab Results  Component Value Date   LDLCALC 15 12/16/2016   [place] 98 09/05/2016   [place] 83 03/12/2016   Lab Results  Component Value Date   TRIG 137 12/16/2016   TRIG 130 09/05/2016   TRIG 124 03/12/2016   Lab Results  Component Value Date   CHOLHDL 2.2 12/16/2016   CHOLHDL 4.6 09/05/2016   CHOLHDL 4.2 03/12/2016   No results found  for: LDLDIRECT  Had shoulder surgery; was prescribed gabapentin and hydrocodone if needed  HTN; he has been taking 50 mg losartan a day  Depression screen [facility] 2/9 03/31/2017 02/19/2017 09/10/2016 03/12/2016 02/12/2015  Decreased Interest 0 0 0 0 0  Down, Depressed, Hopeless 0 0 0 0 0  PHQ - 2 Score 0 0 0 0 0    Relevant past medical, surgical, family and social history reviewed Past Medical History:  Diagnosis Date  . Allergy   . Blockage of coronary artery bypass graft    patient stated that he had blockages - stents were inserted.  . Coronary arteriosclerosis in native artery   . Diabetes mellitus without complication ([place])   . GERD (gastroesophageal reflux disease)    occ  . Glaucoma   . Hearing loss of right ear    patient was told he had slight hearing loss  . History of kidney stones    h/o  . History of trigger finger    bilateral, but mostly in right thumb  . Hyperlipidemia   . Hypertension   . Hypertension goal BP (blood pressure) < 130/80 01/05/2015  . Irregular heart beat   . Sleep apnea    cpap  .  Type 2 diabetes mellitus ([place]) 03/18/2017   A1c 6.28 Jan 2017   Past Surgical History:  Procedure Laterality Date  . CARDIAC CATHETERIZATION     x2 stents [place]  2012   2  . LIPOMA EXCISION Left 03/09/2017   Procedure: EXCISION LIPOMA;  Surgeon: Earnestine Leys, MD;  Location: [facility];  Service: Orthopedics;  Laterality: Left;  . RESECTION DISTAL CLAVICAL Left 03/09/2017   Procedure: RESECTION DISTAL CLAVICAL;  Surgeon: Earnestine Leys, MD;  Location: [facility];  Service: Orthopedics;  Laterality: Left;  . SHOULDER ARTHROSCOPY WITH OPEN ROTATOR CUFF REPAIR Left 03/09/2017   Procedure: SHOULDER ARTHROSCOPY WITH OPEN ROTATOR CUFF REPAIR;  Surgeon: Earnestine Leys, MD;  Location: [facility];  Service: Orthopedics;  Laterality: Left;  . SUBACROMIAL DECOMPRESSION Left 03/09/2017   Procedure: SUBACROMIAL DECOMPRESSION;  Surgeon:  Earnestine Leys, MD;  Location: [facility] OR[facility];  Service: Orthopedics;  Laterality: Left;  BICEPS TENOTOMY   Family History  Problem Relation Age of Onset  . Diabetes Mother        adult onset - geriatric  . Alcohol abuse Father   . Cancer Father        prostate  . Stroke Father        mini strokes  . Heart Problems Father   . Other Maternal Aunt        born with her lung outside of her body; lived until 35.  . Arthritis Maternal Grandmother    Social History   Tobacco Use  . Smoking status: Never Smoker  . Smokeless tobacco: Never Used  Substance Use Topics  . Alcohol use: Yes    Alcohol/week: 0.0 oz    Comment: a glass of wine/beer  . Drug use: No    Interim medical history since last visit reviewed. Allergies and medications reviewed  Review of Systems Per HPI unless specifically indicated above     Objective:    BP 120/78   Pulse 97   Temp 98.6 F (37 C) (Oral)   Resp 14   Wt 271 lb 6.4 oz (123.1 kg)   SpO2 98%   BMI 33.47 kg/m   Wt Readings from Last 3 Encounters:  04/14/17 271 lb 6.4 oz (123.1 kg)  03/31/17 273 lb 8 oz (124.1 kg)  02/19/17 282 lb 6.4 oz (128.1 kg)    Physical Exam  Constitutional: He appears well-developed and well-nourished. No distress.  Weight loss 11 pounds in less than 2 months (intentional)  HENT:  Head: Normocephalic and atraumatic.  Eyes: EOM are normal. No scleral icterus.  Neck: No thyromegaly present.  Cardiovascular: Normal rate and regular rhythm.  Pulmonary/Chest: Effort normal and breath sounds normal.  Abdominal: Soft. Bowel sounds are normal. He exhibits no distension.  Musculoskeletal: He exhibits no edema.  Neurological: Coordination normal.  Skin: Skin is warm and dry. No pallor.  Psychiatric: He has a normal mood and affect. His behavior is normal. Judgment and thought content normal.   Diabetic Foot Form - Detailed   Diabetic Foot Exam - detailed Diabetic Foot exam was performed with the following findings:  Yes  04/14/2017 10:26 AM  Visual Foot Exam completed.:  Yes  Pulse Foot Exam completed.:  Yes  Right Dorsalis Pedis:  Present Left Dorsalis Pedis:  Present  Sensory Foot Exam Completed.:  Yes Semmes-Weinstein Monofilament Test R Site 1-Great Toe:  Pos L Site 1-Great Toe:  Pos        Results for orders placed or performed in  visit on 03/31/17  POCT Glucose (CBG)  Result Value Ref Range   POC Glucose 103 (A) 70 - 99 mg/dl  POCT HgB A1C  Result Value Ref Range   Hemoglobin A1C 6.7   POCT Influenza A/B  Result Value Ref Range   Influenza A, POC Negative Negative   Influenza B, POC Negative Negative      Assessment & Plan:   Problem List Items Addressed This Visit      Cardiovascular and Mediastinum   Hypertension goal BP (blood pressure) < 130/80    At goal; discussed the new lower targets; avoid NSAIDs      Relevant Medications   losartan (COZAAR) 50 MG tablet   Coronary artery disease involving native coronary artery of native heart without angina pectoris    On aspirin, statin, repatha; seeing cardiologist      Relevant Medications   losartan (COZAAR) 50 MG tablet     Endocrine   Type 2 diabetes mellitus ([place])    Refer to diabetic educator for classes; foot exam by MD; explained this increases risk of MI and we will work on addressing factors      Relevant Medications   losartan (COZAAR) 50 MG tablet   Other Relevant Orders   Microalbumin / creatinine urine ratio     Other   Obesity    Patient is working hard on weight loss; goal BMI less than 30 = 242 pounds over the next 6-12 months          Follow up plan: Return in about 3 months (around 07/13/2017) for follow-up visit with Dr. Sanda Klein.  An after-visit summary was printed and given to the patient at [place].  Please see the patient instructions which may contain other information and recommendations beyond what is mentioned above in the assessment and plan.  No orders of the defined types were placed in  this encounter.   Orders Placed This Encounter  Procedures  . Microalbumin / creatinine urine ratio    Face-to-face time with patient was more than 25 minutes, >50% time spent counseling and coordination of care

## 2017-04-14 NOTE — Patient Instructions (Addendum)
Avoid sugary drinks completely or may it a RARE treat White sugar is your enemy If you need something for aches or pains, try to use Tylenol (acetaminophen) instead of non-steroidals (which include Aleve, ibuprofen, Advil, Motrin, and naproxen); non-steroidals can cause long-term kidney damage We'll have you see the diabetic educator   Diabetes Mellitus and Standards of Medical Care Managing diabetes (diabetes mellitus) can be complicated. Your diabetes treatment may be managed by a team of health care providers, including:  A diet and nutrition specialist (registered dietitian).  A nurse.  A certified diabetes educator (CDE).  A diabetes specialist (endocrinologist).  An eye doctor.  A primary care provider.  A dentist.  Your health care providers follow a schedule in order to help you get the best quality of care. The following schedule is a general guideline for your diabetes management plan. Your health care providers may also give you more specific instructions. HbA1c ( hemoglobin A1c) test This test provides information about blood sugar (glucose) control over the previous 2-3 months. It is used to check whether your diabetes management plan needs to be adjusted.  If you are meeting your treatment goals, this test is done at least 2 times a year.  If you are not meeting treatment goals or if your treatment goals have changed, this test is done 4 times a year.  Blood pressure test  This test is done at every routine medical visit. For most people, the goal is less than 130/80. Ask your health care provider what your goal blood pressure should be. Dental and eye exams  Visit your dentist two times a year.  If you have type 1 diabetes, get an eye exam 3-5 years after you are diagnosed, and then once a year after your first exam. ? If you were diagnosed with type 1 diabetes as a child, get an eye exam when you are age 61 or older and have had diabetes for 3-5 years. After  the first exam, you should get an eye exam once a year.  If you have type 2 diabetes, have an eye exam as soon as you are diagnosed, and then once a year after your first exam. Foot care exam  Visual foot exams are done at every routine medical visit. The exams check for cuts, bruises, redness, blisters, sores, or other problems with the feet.  A complete foot exam is done by your health care provider once a year. This exam includes an inspection of the structure and skin of your feet, and a check of the pulses and sensation in your feet. ? Type 1 diabetes: Get your first exam 3-5 years after diagnosis. ? Type 2 diabetes: Get your first exam as soon as you are diagnosed.  Check your feet every day for cuts, bruises, redness, blisters, or sores. If you have any of these or other problems that are not healing, contact your health care provider. Kidney function test ( urine microalbumin)  This test is done once a year. ? Type 1 diabetes: Get your first test 5 years after diagnosis. ? Type 2 diabetes: Get your first test as soon as you are diagnosed.  If you have chronic kidney disease (CKD), get a serum creatinine and estimated glomerular filtration rate (eGFR) test once a year. Lipid profile (cholesterol, HDL, LDL, triglycerides)  This test should be done when you are diagnosed with diabetes, and every 5 years after the first test. If you are on medicines to lower your cholesterol, you may need  to get this test done every year. ? The goal for LDL is less than 100 mg/dL (5.5 mmol/L). If you are at high risk, the goal is less than 70 mg/dL (3.9 mmol/L). ? The goal for HDL is 40 mg/dL (2.2 mmol/L) for men and 50 mg/dL(2.8 mmol/L) for women. An HDL cholesterol of 60 mg/dL (3.3 mmol/L) or higher gives some protection against heart disease. ? The goal for triglycerides is less than 150 mg/dL (8.3 mmol/L). Immunizations  The yearly flu (influenza) vaccine is recommended for everyone 6 months or  older who has diabetes.  The pneumonia (pneumococcal) vaccine is recommended for everyone 2 years or older who has diabetes. If you are 61 or older, you may get the pneumonia vaccine as a series of two separate shots.  The hepatitis B vaccine is recommended for adults shortly after they have been diagnosed with diabetes.  The Tdap (tetanus, diphtheria, and pertussis) vaccine should be given: ? According to normal childhood vaccination schedules, for children. ? Every 10 years, for adults who have diabetes.  The shingles vaccine is recommended for people who have had chicken pox and are 50 years or older. Mental and emotional health  Screening for symptoms of eating disorders, anxiety, and depression is recommended at the time of diagnosis and afterward as needed. If your screening shows that you have symptoms (you have a positive screening result), you may need further evaluation and be referred to a mental health care provider. Diabetes self-management education  Education about how to manage your diabetes is recommended at diagnosis and ongoing as needed. Treatment plan  Your treatment plan will be reviewed at every medical visit. Summary  Managing diabetes (diabetes mellitus) can be complicated. Your diabetes treatment may be managed by a team of health care providers.  Your health care providers follow a schedule in order to help you get the best quality of care.  Standards of care including having regular physical exams, blood tests, blood pressure monitoring, immunizations, screening tests, and education about how to manage your diabetes.  Your health care providers may also give you more specific instructions based on your individual health. This information is not intended to replace advice given to you by your health care provider. Make sure you discuss any questions you have with your health care provider. Document Released: 01/05/2009 Document Revised: 12/07/2015 Document  Reviewed: 12/07/2015 Elsevier Interactive Patient Education  2018 [employer].  How to Avoid Diabetes Mellitus Problems You can take action to prevent or slow down problems that are caused by diabetes (diabetes mellitus). Following your diabetes plan and taking care of yourself can reduce your risk of serious or life-threatening complications. Manage your diabetes  Follow instructions from your health care providers about managing your diabetes. Your diabetes may be managed by a team of health care providers who can teach you how to care for yourself and can answer questions that you have.  Educate yourself about your condition so you can make healthy choices about eating and physical activity.  Check your blood sugar (glucose) levels as often as directed. Your health care provider will help you decide how often to check your blood glucose level depending on your treatment goals and how well you are meeting them.  Ask your health care provider if you should take low-dose aspirin daily and what dose is recommended for you. Taking low-dose aspirin daily is recommended to help prevent cardiovascular disease. Do not use nicotine or tobacco Do not use any products that contain nicotine  or tobacco, such as cigarettes and e-cigarettes. If you need help quitting, ask your health care provider. Nicotine raises your risk for diabetes problems. If you quit using nicotine:  You will lower your risk for heart attack, stroke, nerve disease, and kidney disease.  Your cholesterol and blood pressure may improve.  Your blood circulation will improve.  Keep your blood pressure under control To control your blood pressure:  Follow instructions from your health care provider about meal planning, exercise, and medicines.  Make sure your health care provider checks your blood pressure at every medical visit.  A blood pressure reading consists of two numbers. Generally, the goal is to keep your top number  (systolic pressure) at or below 130, and your bottom number (diastolic pressure) at or below 80. Your health care provider may recommend a lower target blood pressure. Your individualized target blood pressure is determined based on:  Your age.  Your medicines.  How long you have had diabetes.  Any other medical conditions you have.  Keep your cholesterol under control To control your cholesterol:  Follow instructions from your health care provider about meal planning, exercise, and medicines.  Have your cholesterol checked at least once a year.  You may be prescribed medicine to lower cholesterol (statin). If you are not taking a statin, ask your health care provider if you should be.  Controlling your cholesterol may:  Help prevent heart disease and stroke. These are the most common health problems for people with diabetes.  Improve your blood flow.  Schedule and keep yearly physical exams and eye exams Your health care provider will tell you how often you need medical visits depending on your diabetes management plan. Keep all follow-up visits as directed. This is important so possible problems can be identified early and complications can be avoided or treated.  Every visit with your health care provider should include measuring your: ? Weight. ? Blood pressure. ? Blood glucose control.  Your A1c (hemoglobin A1c) level should be checked: ? At least 2 times a year, if you are meeting your treatment goals. ? 4 times a year, if you are not meeting treatment goals or if your treatment goals have changed.  Your blood lipids (lipid profile) should be checked yearly. You should also be checked yearly for protein in your urine (urine microalbumin).  If you have type 1 diabetes, get an eye exam 3-5 years after you are diagnosed, and then once a year after your first exam.  If you have type 2 diabetes, get an eye exam as soon as you are diagnosed, and then once a year after your  first exam.  Keep your vaccines current It is recommended that you receive:  A flu (influenza) vaccine every year.  A pneumonia (pneumococcal) vaccine and a hepatitis B vaccine. If you are age 57 or older, you may get the pneumonia vaccine as a series of two separate shots.  Ask your health care provider which other vaccines may be recommended. Take care of your feet Diabetes may cause you to have poor blood circulation to your legs and feet. Because of this, taking care of your feet is very important. Diabetes can cause:  The skin on the feet to get thinner, break more easily, and heal more slowly.  Nerve damage in your legs and feet, which results in decreased feeling. You may not notice minor injuries that could lead to serious problems.  To avoid foot problems:  Check your skin and feet every day  for cuts, bruises, redness, blisters, or sores.  Schedule a foot exam with your health care provider once every year. This exam includes: ? Inspecting of the structure and skin of your feet. ? Checking the pulses and sensation in your feet.  Make sure that your health care provider performs a visual foot exam at every medical visit.  Take care of your teeth People with poorly controlled diabetes are more likely to have gum (periodontal) disease. Diabetes can make periodontal diseases harder to control. If not treated, periodontal diseases can lead to tooth loss. To prevent this:  Brush your teeth twice a day.  Floss at least once a day.  Visit your dentist 2 times a year.  Drink responsibly Limit alcohol intake to no more than 1 drink a day for nonpregnant women and 2 drinks a day for men. One drink equals 12 oz of beer, 5 oz of wine, or 1 oz of hard liquor. It is important to eat food when you drink alcohol to avoid low blood glucose (hypoglycemia). Avoid alcohol if you:  Have a history of alcohol abuse or dependence.  Are pregnant.  Have liver disease, pancreatitis, advanced  neuropathy, or severe hypertriglyceridemia.  Lessen stress Living with diabetes can be stressful. When you are experiencing stress, your blood glucose may be affected in two ways:  Stress hormones may cause your blood glucose to rise.  You may be distracted from taking good care of yourself.  Be aware of your stress level and make changes to help you manage challenging situations. To lower your stress levels:  Consider joining a support group.  Do planned relaxation or meditation.  Do a hobby that you enjoy.  Maintain healthy relationships.  Exercise regularly.  Work with your health care provider or a mental health professional.  Summary  You can take action to prevent or slow down problems that are caused by diabetes (diabetes mellitus). Following your diabetes plan and taking care of yourself can reduce your risk of serious or life-threatening complications.  Follow instructions from your health care providers about managing your diabetes. Your diabetes may be managed by a team of health care providers who can teach you how to care for yourself and can answer questions that you have.  Your health care provider will tell you how often you need medical visits depending on your diabetes management plan. Keep all follow-up visits as directed. This is important so possible problems can be identified early and complications can be avoided or treated. This information is not intended to replace advice given to you by your health care provider. Make sure you discuss any questions you have with your health care provider. Document Released: 11/26/2010 Document Revised: 12/08/2015 Document Reviewed: 12/08/2015 Elsevier Interactive Patient Education  2018 [place] With Diabetes Diabetes (type 1 diabetes mellitus or type 2 diabetes mellitus) is a condition in which the body does not have enough of a hormone called insulin, or the body does not respond properly to insulin.  Normally, insulin allows sugars (glucose) to enter cells in the body. The cells use glucose for energy. With diabetes, extra glucose builds up in the blood instead of going into cells, which results in high blood glucose (hyperglycemia). How to manage lifestyle changes Managing diabetes includes medical treatments as well as lifestyle changes. If diabetes is not managed well, serious physical and emotional complications can occur. Taking good care of yourself means that you are responsible for:  Monitoring glucose regularly.  Eating a healthy  diet.  Exercising regularly.  Meeting with health care providers.  Taking medicines as directed.  Some people may feel a lot of stress about managing their diabetes. This is known as emotional distress, and it is very common. Living with diabetes can place you at risk for emotional distress, depression, or anxiety. These disorders can be confusing and can make diabetes management more difficult. How to recognize stress Emotional distress Symptoms of emotional distress include:  Anger about having a diagnosis of diabetes.  Fear or frustration about your diagnosis and the changes you need to make to manage the condition.  Being overly worried about the care that you need or the cost of the care you need.  Feeling like you caused your condition by doing something wrong.  Fear of unpredictable situations, like low or high blood glucose.  Feeling judged by your health care providers.  Feeling very alone with the disease.  Getting too tired or "burned out" with the demands of daily care.  Depression Having diabetes means that you are at a higher risk for depression. Having depression also means that you are at a higher risk for diabetes. Your health care provider may test (screen) you for symptoms of depression. It is important to recognize depression symptoms and to start treatment for it soon after it is diagnosed. The following are some symptoms  of depression:  Loss of interest in things that you used to enjoy.  Trouble sleeping, or often waking up early and not being able to get back to sleep.  A change in appetite.  Feeling tired most of the day.  Feeling nervous and anxious.  Feeling guilty and worrying that you are a burden to others.  Feeling depressed more often than you do not feel that way.  Thoughts of hurting yourself or feeling that you want to die.  If you have any of these symptoms for 2 weeks or longer, reach out to a health care provider. Where to find support  Ask your health care provider to recommend a therapist who understands both depression and diabetes.  Search for information and support from the American Diabetes Association: www.diabetes.org  Find a certified diabetes educator and make an appointment through [place] [facility] [employer] Educators: www.diabeteseducator.org Follow these instructions at home: Managing emotional distress The following are some ways to manage emotional distress:  Talk with your health care provider or certified diabetes educator. Consider working with a counselor or therapist.  Learn as much as you can about diabetes and its treatment. Meet with a certified diabetes educator or take a class to learn how to manage your condition.  Keep a journal of your thoughts and concerns.  Accept that some things are out of your control.  Talk with other people who have diabetes. It can help to talk with others about the emotional distress that you feel.  Find ways to manage stress that work for you. These may include art or music therapy, exercise, meditation, and hobbies.  Seek support from spiritual leaders, family, and friends.  General instructions  Follow your diabetes management plan.  Keep all follow-up visits as told by your health care provider. This is important. Get help right away if:  You have thoughts about hurting yourself or others. If you ever  feel like you may hurt yourself or others, or have thoughts about taking your own life, get help right away. You can go to your nearest emergency department or call:  Your local emergency services (911 in the  [place]  A suicide crisis helpline, such as the [facility] at 814-215-9177. This is open 24 hours a day.  Summary  Diabetes (type 1 diabetes mellitus or type 2 diabetes mellitus) is a condition in which the body does not have enough of a hormone called insulin, or the body does not respond properly to insulin.  Living with diabetes puts you at risk for medical issues, and it also puts you at risk for emotional issues such as emotional distress, depression, and anxiety.  Recognizing the symptoms of emotional distress and depression may help you avoid problems with your diabetes control. It is important to start treatment for emotional distress and depression soon after they are diagnosed.  Having diabetes means that you are at a higher risk for depression. Ask your health care provider to recommend a therapist who understands both depression and diabetes.  If you experience symptoms of emotional distress or depression, it is important to discuss this with your health care provider, certified diabetes educator, or therapist. This information is not intended to replace advice given to you by your health care provider. Make sure you discuss any questions you have with your health care provider. Document Released: 07/24/2016 Document Revised: 07/24/2016 Document Reviewed: 07/24/2016 Elsevier Interactive Patient Education  2018 [employer].

## 2017-04-14 NOTE — Assessment & Plan Note (Signed)
At goal; discussed the new lower targets; avoid NSAIDs

## 2017-04-14 NOTE — Assessment & Plan Note (Signed)
Patient is working hard on weight loss; goal BMI less than 30 = 242 pounds over the next 6-12 months

## 2017-04-15 LAB — MICROALBUMIN / CREATININE URINE RATIO
Creatinine, Urine: 160 mg/dL (ref 20–320)
MICROALB UR: 1.5 mg/dL
MICROALB/CREAT RATIO: 9 ug/mg{creat} (ref <30)

## 2017-04-21 ENCOUNTER — Encounter
Admission: RE | Disposition: A | Discharge status: Home / Self Care | Payer: Self-pay | Source: Ambulatory Visit | Attending: Gastroenterology

## 2017-04-21 ENCOUNTER — Ambulatory Visit: Payer: BC Managed Care – PPO | Admitting: Anesthesiology

## 2017-04-21 ENCOUNTER — Ambulatory Visit
Admission: RE | Admit: 2017-04-21 | Discharge: 2017-04-21 | Disposition: A | Discharge status: Home / Self Care | Payer: BC Managed Care – PPO | Source: Ambulatory Visit | Attending: Gastroenterology | Admitting: Gastroenterology

## 2017-04-21 ENCOUNTER — Encounter: Payer: Self-pay | Admitting: *Deleted

## 2017-04-21 DIAGNOSIS — I2510 Atherosclerotic heart disease of native coronary artery without angina pectoris: Secondary | ICD-10-CM | POA: Diagnosis not present

## 2017-04-21 DIAGNOSIS — Z79899 Other long term (current) drug therapy: Secondary | ICD-10-CM | POA: Insufficient documentation

## 2017-04-21 DIAGNOSIS — G4730 Sleep apnea, unspecified: Secondary | ICD-10-CM | POA: Diagnosis not present

## 2017-04-21 DIAGNOSIS — Z9989 Dependence on other enabling machines and devices: Secondary | ICD-10-CM | POA: Diagnosis not present

## 2017-04-21 DIAGNOSIS — K219 Gastro-esophageal reflux disease without esophagitis: Secondary | ICD-10-CM | POA: Insufficient documentation

## 2017-04-21 DIAGNOSIS — I10 Essential (primary) hypertension: Secondary | ICD-10-CM | POA: Diagnosis not present

## 2017-04-21 DIAGNOSIS — Z7982 Long term (current) use of aspirin: Secondary | ICD-10-CM | POA: Insufficient documentation

## 2017-04-21 DIAGNOSIS — E119 Type 2 diabetes mellitus without complications: Secondary | ICD-10-CM | POA: Insufficient documentation

## 2017-04-21 DIAGNOSIS — Z1211 Encounter for screening for malignant neoplasm of colon: Secondary | ICD-10-CM | POA: Diagnosis not present

## 2017-04-21 DIAGNOSIS — Z87442 Personal history of urinary calculi: Secondary | ICD-10-CM | POA: Insufficient documentation

## 2017-04-21 DIAGNOSIS — E785 Hyperlipidemia, unspecified: Secondary | ICD-10-CM | POA: Insufficient documentation

## 2017-04-21 DIAGNOSIS — K640 First degree hemorrhoids: Secondary | ICD-10-CM | POA: Insufficient documentation

## 2017-04-21 DIAGNOSIS — Z951 Presence of aortocoronary bypass graft: Secondary | ICD-10-CM | POA: Insufficient documentation

## 2017-04-21 DIAGNOSIS — K5730 Diverticulosis of large intestine without perforation or abscess without bleeding: Secondary | ICD-10-CM | POA: Diagnosis not present

## 2017-04-21 DIAGNOSIS — D122 Benign neoplasm of ascending colon: Secondary | ICD-10-CM

## 2017-04-21 HISTORY — PX: COLONOSCOPY WITH PROPOFOL: SHX5780

## 2017-04-21 SURGERY — COLONOSCOPY WITH PROPOFOL
Anesthesia: General

## 2017-04-21 MED ORDER — PROPOFOL 500 MG/50ML IV EMUL
INTRAVENOUS | Status: DC | PRN
  Administered 2017-04-21: 25 ug/kg/min via INTRAVENOUS

## 2017-04-21 MED ORDER — PROPOFOL 500 MG/50ML IV EMUL
INTRAVENOUS | Status: AC
  Filled 2017-04-21: qty 50

## 2017-04-21 MED ORDER — SODIUM CHLORIDE 0.9 % IV SOLN
INTRAVENOUS | Status: DC
  Administered 2017-04-21 (×2): via INTRAVENOUS

## 2017-04-21 NOTE — Anesthesia Postprocedure Evaluation (Signed)
Anesthesia Post Note  Patient: Dean Sullivan  Procedure(s) Performed: COLONOSCOPY WITH PROPOFOL (N/A )  Patient location during evaluation: PACU Anesthesia Type: General Level of consciousness: awake and alert and oriented Pain management: pain level controlled Vital Signs Assessment: post-procedure vital signs reviewed and stable Respiratory status: spontaneous breathing Cardiovascular status: blood pressure returned to baseline Anesthetic complications: no     Last Vitals:  Vitals:   04/21/17 0840 04/21/17 0850  BP: 103/61 108/73  Pulse: 97 81  Resp: 16 16  Temp:    SpO2: 100% 98%    Last Pain:  Vitals:   04/21/17 0726  TempSrc: Tympanic                 [name]

## 2017-04-21 NOTE — H&P (Signed)
Dean Bellows, MD [address], [place], [place], [place], [address] [address], [place], [place], [place], [address] Phone: [phone]  Fax: [phone]  Primary Care Physician:  Arnetha Courser, MD   Pre-Procedure History & Physical: HPI:  Dean Sullivan is a 62 y.o. male is here for an colonoscopy.   Past Medical History:  Diagnosis Date  . Allergy   . Blockage of coronary artery bypass graft    patient stated that he had blockages - stents were inserted.  . Coronary arteriosclerosis in native artery   . Diabetes mellitus without complication (Dean Sullivan)   . GERD (gastroesophageal reflux disease)    occ  . Glaucoma   . Hearing loss of right ear    patient was told he had slight hearing loss  . History of kidney stones    h/o  . History of trigger finger    bilateral, but mostly in right thumb  . Hyperlipidemia   . Hypertension   . Hypertension goal BP (blood pressure) < 130/80 01/05/2015  . Irregular heart beat   . Sleep apnea    cpap  . Type 2 diabetes mellitus (Dean Sullivan) 03/18/2017   A1c 6.28 Jan 2017    Past Surgical History:  Procedure Laterality Date  . CARDIAC CATHETERIZATION     x2 stents [place]  2012   2  . LIPOMA EXCISION Left 03/09/2017   Procedure: EXCISION LIPOMA;  Surgeon: Earnestine Leys, MD;  Location: [facility] ORS;  Service: Orthopedics;  Laterality: Left;  . RESECTION DISTAL CLAVICAL Left 03/09/2017   Procedure: RESECTION DISTAL CLAVICAL;  Surgeon: Earnestine Leys, MD;  Location: [facility] ORS;  Service: Orthopedics;  Laterality: Left;  . SHOULDER ARTHROSCOPY WITH OPEN ROTATOR CUFF REPAIR Left 03/09/2017   Procedure: SHOULDER ARTHROSCOPY WITH OPEN ROTATOR CUFF REPAIR;  Surgeon: Earnestine Leys, MD;  Location: [facility] OR[facility];  Service: Orthopedics;  Laterality: Left;  . SUBACROMIAL DECOMPRESSION Left 03/09/2017   Procedure: SUBACROMIAL DECOMPRESSION;  Surgeon: Earnestine Leys, MD;  Location: [facility] OR[facility];  Service:  Orthopedics;  Laterality: Left;  BICEPS TENOTOMY    Prior to Admission medications   Medication Sig Start Date End Date Taking? Authorizing Provider  acetaminophen (TYLENOL) 650 MG CR tablet Take 650 mg by mouth 2 (two) times daily.   Yes [provider]  aspirin 81 MG tablet Take 81 mg by mouth daily.   Yes [provider]  cetirizine (ZYRTEC) 10 MG tablet Take 10 mg by mouth daily.   Yes [provider]  Cholecalciferol (VITAMIN D) 2000 UNITS tablet Take 2,000 Units by mouth daily.    Yes [provider]  Evolocumab (REPATHA SURECLICK) 834 MG/ML SOAJ Inject 1 pen into the skin every 14 (fourteen) days. 10/17/16  Yes Minna Merritts, MD  gabapentin (NEURONTIN) 400 MG capsule Take 1 capsule (400 mg total) by mouth 2 (two) times daily. 03/09/17  Yes Earnestine Leys, MD  HYDROcodone-acetaminophen (NORCO) 7.5-325 MG tablet Take 1 tablet by mouth every 6 (six) hours as needed for moderate pain. 03/09/17  Yes Earnestine Leys, MD  latanoprost (XALATAN) 0.005 % ophthalmic solution Place 1 drop into both eyes at bedtime.  08/24/16  Yes [provider]  losartan (COZAAR) 50 MG tablet Take 1 tablet (50 mg total) by mouth daily. 04/14/17  Yes Dean, Satira Anis, MD  rosuvastatin (CRESTOR) 5 MG tablet Take 1 tablet (5 mg total) by mouth daily. Patient taking differently: Take 5 mg by  mouth at bedtime.  12/18/16  Yes Minna Merritts, MD    Allergies as of 02/20/2017  . (No Known Allergies)    Family History  Problem Relation Age of Onset  . Diabetes Mother        adult onset - geriatric  . Alcohol abuse Father   . Cancer Father        prostate  . Stroke Father        mini strokes  . Heart Problems Father   . Other Maternal Aunt        born with her lung outside of her body; lived until 43.  . Arthritis Maternal Grandmother     Social History   Socioeconomic History  . Marital status: Divorced    Spouse name: Not on file  . Number of children: Not on  file  . Years of education: Not on file  . Highest education level: Not on file  Social Needs  . Financial resource strain: Not on file  . Food insecurity - worry: Not on file  . Food insecurity - inability: Not on file  . Transportation needs - medical: Not on file  . Transportation needs - non-medical: Not on file  Occupational History  . Not on file  Tobacco Use  . Smoking status: Never Smoker  . Smokeless tobacco: Never Used  Substance and Sexual Activity  . Alcohol use: Yes    Alcohol/week: 0.0 oz    Comment: a glass of wine/beer  . Drug use: No  . Sexual activity: Yes    Partners: Female  Other Topics Concern  . Not on file  Social History Narrative  . Not on file    Review of Systems: See HPI, otherwise negative ROS  Physical Exam: BP 121/81   Pulse 87   Temp (!) 97 F (36.1 C) (Tympanic)   Resp 20   Ht 6\' 4"  (1.93 m)   Wt 267 lb (121.1 kg)   SpO2 100%   BMI 32.50 kg/m  General:   Alert,  pleasant and cooperative in NAD Head:  Normocephalic and atraumatic. Neck:  Supple; no masses or thyromegaly. Lungs:  Clear throughout to auscultation, normal respiratory effort.    Heart:  +S1, +S2, Regular rate and rhythm, No edema. Abdomen:  Soft, nontender and nondistended. Normal bowel sounds, without guarding, and without rebound.   Neurologic:  Alert and  oriented x4;  grossly normal neurologically.  Impression/Plan: Dean Sullivan is here for an colonoscopy to be performed for Screening colonoscopy average risk   Risks, benefits, limitations, and alternatives regarding  colonoscopy have been reviewed with the patient.  Questions have been answered.  All parties agreeable.   Dean Bellows, MD  04/21/2017, 8:00 AM

## 2017-04-21 NOTE — Anesthesia Preprocedure Evaluation (Signed)
Anesthesia Evaluation  Patient identified by MRN, date of birth, ID band Patient awake    Reviewed: Allergy & Precautions, NPO status , Patient's Chart, lab work & pertinent test results  History of Anesthesia Complications Negative for: history of anesthetic complications  Airway Mallampati: III  TM Distance: >3 FB Neck ROM: Full    Dental no notable dental hx.    Pulmonary sleep apnea and Continuous Positive Airway Pressure Ventilation , neg COPD,    breath sounds clear to auscultation- rhonchi (-) wheezing      Cardiovascular hypertension, Pt. on medications + CAD and + Cardiac Stents  (-) Past MI and (-) CABG  Rhythm:Regular Rate:Normal - Systolic murmurs and - Diastolic murmurs    Neuro/Psych negative neurological ROS  negative psych ROS   GI/Hepatic Neg liver ROS, GERD  ,  Endo/Other  negative endocrine ROSdiabetes  Renal/GU negative Renal ROS     Musculoskeletal negative musculoskeletal ROS (+)   Abdominal (+) + obese,   Peds  Hematology negative hematology ROS (+)   Anesthesia Other Findings Past Medical History: No date: Allergy No date: Blockage of coronary artery bypass graft     Comment:  patient stated that he had blockages - stents were               inserted. No date: Coronary arteriosclerosis in native artery No date: GERD (gastroesophageal reflux disease)     Comment:  occ No date: Glaucoma No date: Hearing loss of right ear     Comment:  patient was told he had slight hearing loss No date: History of kidney stones     Comment:  h/o No date: History of trigger finger     Comment:  bilateral, but mostly in right thumb No date: Hyperlipidemia No date: Hypertension No date: Irregular heart beat 09/10/2016: Prediabetes No date: Sleep apnea     Comment:  cpap   Reproductive/Obstetrics                             Anesthesia Physical  Anesthesia Plan  ASA:  III  Anesthesia Plan: General   Post-op Pain Management:  Regional for Post-op pain   Induction: Intravenous  PONV Risk Score and Plan:   Airway Management Planned: Nasal Cannula  Additional Equipment:   Intra-op Plan:   Post-operative Plan:   Informed Consent: I have reviewed the patients History and Physical, chart, labs and discussed the procedure including the risks, benefits and alternatives for the proposed anesthesia with the patient or authorized representative who has indicated his/her understanding and acceptance.   Dental advisory given  Plan Discussed with: CRNA and Anesthesiologist  Anesthesia Plan Comments:         Anesthesia Quick Evaluation

## 2017-04-21 NOTE — Op Note (Signed)
[facility] Gastroenterology Patient Name: Dean Sullivan Procedure Date: 04/21/2017 8:02 AM MRN: [id number] Account #: [id number] Date of Birth: [birth year] Admit Type: Outpatient Age: 62 Room: [facility] ENDO ROOM 1 Gender: Male Note Status: Finalized Procedure:            Colonoscopy Indications:          Screening for colorectal malignant neoplasm Providers:            Jonathon Bellows MD, MD Referring MD:         Arnetha Courser (Referring MD) Medicines:            Monitored Anesthesia Care Complications:        No immediate complications. Procedure:            Pre-Anesthesia Assessment:                       - Prior to the procedure, a History and Physical was                        performed, and patient medications, allergies and                        sensitivities were reviewed. The patient's tolerance of                        previous anesthesia was reviewed.                       - The risks and benefits of the procedure and the                        sedation options and risks were discussed with the                        patient. All questions were answered and informed                        consent was obtained.                       - ASA Grade Assessment: II - A patient with mild                        systemic disease.                       After obtaining informed consent, the colonoscope was                        passed under direct vision. Throughout the procedure,                        the patient's blood pressure, pulse, and oxygen                        saturations were monitored continuously. The                        Colonoscope was introduced through the anus and  advanced to the the cecum, identified by the                        appendiceal orifice, IC valve and transillumination.                        The colonoscopy was performed with ease. The patient                        tolerated the procedure well. The quality  of the bowel                        preparation was good. Findings:      The perianal and digital rectal examinations were normal.      Non-bleeding internal hemorrhoids were found during retroflexion. The       hemorrhoids were medium-sized and Grade I (internal hemorrhoids that do       not prolapse).      Multiple small-mouthed diverticula were found in the left colon.      A 3 mm polyp was found in the ascending colon. The polyp was sessile.       The polyp was removed with a cold biopsy forceps. Resection and       retrieval were complete.      The exam was otherwise without abnormality on direct and retroflexion       views. Impression:           - Non-bleeding internal hemorrhoids.                       - Diverticulosis in the left colon.                       - One 3 mm polyp in the ascending colon, removed with a                        cold biopsy forceps. Resected and retrieved.                       - The examination was otherwise normal on direct and                        retroflexion views. Recommendation:       - Discharge patient to home (with escort).                       - Resume previous diet.                       - Continue present medications.                       - Await pathology results.                       - Repeat colonoscopy in 5-10 years for surveillance                        based on pathology results. Procedure Code(s):    --- Professional ---  45380, Colonoscopy, flexible; with biopsy, single or                        multiple Diagnosis Code(s):    --- Professional ---                       Z12.11, Encounter for screening for malignant neoplasm                        of colon                       K64.0, First degree hemorrhoids                       D12.2, Benign neoplasm of ascending colon                       K57.30, Diverticulosis of large intestine without                        perforation or abscess without  bleeding CPT copyright 2016 American Medical Association. All rights reserved. The codes documented in this report are preliminary and upon coder review may  be revised to meet current compliance requirements. Jonathon Bellows, MD Jonathon Bellows MD, MD 04/21/2017 8:30:35 AM This report has been signed electronically. Number of Addenda: 0 Note Initiated On: 04/21/2017 8:02 AM Scope Withdrawal Time: 0 hours 12 minutes 36 seconds  Total Procedure Duration: 0 hours 19 minutes 35 seconds       [facility]

## 2017-04-21 NOTE — Transfer of Care (Signed)
Immediate Anesthesia Transfer of Care Note  Patient: Dean Sullivan  Procedure(s) Performed: COLONOSCOPY WITH PROPOFOL (N/A )  Patient Location: PACU  Anesthesia Type:General  Level of Consciousness: awake and alert   Airway & Oxygen Therapy: Patient Spontanous Breathing  Post-op Assessment: Report given to RN  Post vital signs: Reviewed and stable  Last Vitals:  Vitals:   04/21/17 0726  BP: 121/81  Pulse: 87  Resp: 20  Temp: (!) 36.1 C  SpO2: 100%    Last Pain:  Vitals:   04/21/17 0726  TempSrc: Tympanic         Complications: No apparent anesthesia complications

## 2017-04-21 NOTE — Anesthesia Post-op Follow-up Note (Signed)
Anesthesia QCDR form completed.        

## 2017-04-22 ENCOUNTER — Encounter: Payer: Self-pay | Admitting: Gastroenterology

## 2017-04-23 ENCOUNTER — Encounter: Payer: Self-pay | Admitting: Gastroenterology

## 2017-04-23 LAB — SURGICAL PATHOLOGY

## 2017-07-13 ENCOUNTER — Ambulatory Visit: Payer: BC Managed Care – PPO | Admitting: Family Medicine

## 2017-07-13 ENCOUNTER — Encounter: Payer: Self-pay | Admitting: Family Medicine

## 2017-07-13 VITALS — BP 122/82 | HR 74 | Temp 98.7°F | Resp 14 | Ht 75.5 in | Wt 269.1 lb

## 2017-07-13 DIAGNOSIS — Z23 Encounter for immunization: Secondary | ICD-10-CM | POA: Diagnosis not present

## 2017-07-13 DIAGNOSIS — E785 Hyperlipidemia, unspecified: Secondary | ICD-10-CM | POA: Diagnosis not present

## 2017-07-13 DIAGNOSIS — Z6833 Body mass index (BMI) 33.0-33.9, adult: Secondary | ICD-10-CM

## 2017-07-13 DIAGNOSIS — I10 Essential (primary) hypertension: Secondary | ICD-10-CM

## 2017-07-13 DIAGNOSIS — E6609 Other obesity due to excess calories: Secondary | ICD-10-CM

## 2017-07-13 DIAGNOSIS — Z5181 Encounter for therapeutic drug level monitoring: Secondary | ICD-10-CM | POA: Diagnosis not present

## 2017-07-13 DIAGNOSIS — R109 Unspecified abdominal pain: Secondary | ICD-10-CM

## 2017-07-13 DIAGNOSIS — E119 Type 2 diabetes mellitus without complications: Secondary | ICD-10-CM

## 2017-07-13 DIAGNOSIS — I2510 Atherosclerotic heart disease of native coronary artery without angina pectoris: Secondary | ICD-10-CM

## 2017-07-13 NOTE — Assessment & Plan Note (Signed)
So glad he is not adding salt to foods; continue meds

## 2017-07-13 NOTE — Assessment & Plan Note (Signed)
Managed by Dr. Rockey Situ, cardiologist; no chest pain

## 2017-07-13 NOTE — Assessment & Plan Note (Signed)
Stable A1c; check today; if less than 7, next visit and labs in October; foot exam by MD today

## 2017-07-13 NOTE — Progress Notes (Signed)
BP 122/82   Pulse 74   Temp 98.7 F (37.1 C) (Oral)   Resp 14   Ht 6' 3.5" (1.918 m)   Wt 269 lb 1.6 oz (122.1 kg)   SpO2 97%   BMI 33.19 kg/m    Subjective:    Patient ID: Dean Sullivan, male    DOB: 1955-12-01, 62 y.o.   MRN: [id number]  HPI: Dean Sullivan is a 62 y.o. male  Chief Complaint  Patient presents with  . Follow-up    HPI  Type 2 diabetes; cut down on sugars; does drink ginger ale, but does regular but willing to switch to diet Lab Results  Component Value Date   HGBA1C 6.7 03/31/2017    HTN; has really cut out salts now; avoiding salty meats; even tells them at [employer] to use light soy  Obesity; he weighs a little less at home; trying to lose weight; eating salads; eating veggies and fruits; cut down on hamburger; more chicken and pork and fish; baked even though not his preference; splenda  High cholesterol; taking it 1/4 of a pill at bedtime, but that was the 20 mg pill = 5 mg; has another Rx already for the 5 mg per cardiologist; also on [place]; works well; has done several shots and only one on left thight left a little mark Lab Results  Component Value Date   CHOL 77 (L) 12/16/2016   HDL 35 (L) 12/16/2016   [place] 15 12/16/2016   TRIG 137 12/16/2016   CHOLHDL 2.2 12/16/2016    Sore on the right flank; feels like a cramp is coming; thought it was a muscle pull; not pain with pushing there; just stays there; he can make it stop if he moves a different way; it can change with position; when leaning over laptop; right-handed; laptop is down on coffee table Depression screen [facility] 2/9 07/13/2017 03/31/2017 02/19/2017 09/10/2016 03/12/2016  Decreased Interest 0 0 0 0 0  Down, Depressed, Hopeless 0 0 0 0 0  PHQ - 2 Score 0 0 0 0 0    Relevant past medical, surgical, family and social history reviewed Past Medical History:  Diagnosis Date  . Allergy   . Blockage of coronary artery bypass graft    patient stated that he had blockages -  stents were inserted.  . Coronary arteriosclerosis in native artery   . Diabetes mellitus without complication ([place])   . GERD (gastroesophageal reflux disease)    occ  . Glaucoma   . Hearing loss of right ear    patient was told he had slight hearing loss  . History of kidney stones    h/o  . History of trigger finger    bilateral, but mostly in right thumb  . Hyperlipidemia   . Hypertension   . Hypertension goal BP (blood pressure) < 130/80 01/05/2015  . Irregular heart beat   . Sleep apnea    cpap  . Type 2 diabetes mellitus ([place]) 03/18/2017   A1c 6.28 Jan 2017   Past Surgical History:  Procedure Laterality Date  . CARDIAC CATHETERIZATION     x2 stents [place]  2012   2  . COLONOSCOPY WITH PROPOFOL N/A 04/21/2017   Procedure: COLONOSCOPY WITH PROPOFOL;  Surgeon: Jonathon Bellows, MD;  Location: [facility] ENDOSCOPY;  Service: Gastroenterology;  Laterality: N/A;  . LIPOMA EXCISION Left 03/09/2017   Procedure: EXCISION LIPOMA;  Surgeon: Earnestine Leys, MD;  Location: [facility] ORS;  Service: Orthopedics;  Laterality: Left;  . RESECTION DISTAL CLAVICAL Left 03/09/2017   Procedure: RESECTION DISTAL CLAVICAL;  Surgeon: Earnestine Leys, MD;  Location: [facility] ORS;  Service: Orthopedics;  Laterality: Left;  . SHOULDER ARTHROSCOPY WITH OPEN ROTATOR CUFF REPAIR Left 03/09/2017   Procedure: SHOULDER ARTHROSCOPY WITH OPEN ROTATOR CUFF REPAIR;  Surgeon: Earnestine Leys, MD;  Location: [facility] ORS;  Service: Orthopedics;  Laterality: Left;  . SUBACROMIAL DECOMPRESSION Left 03/09/2017   Procedure: SUBACROMIAL DECOMPRESSION;  Surgeon: Earnestine Leys, MD;  Location: [facility] ORS;  Service: Orthopedics;  Laterality: Left;  BICEPS TENOTOMY   Family History  Problem Relation Age of Onset  . Diabetes Mother        adult onset - geriatric  . Alcohol abuse Father   . Cancer Father        prostate  . Stroke Father        mini strokes  . Heart Problems Father   . Other Maternal  Aunt        born with her lung outside of her body; lived until 36.  . Arthritis Maternal Grandmother    Social History   Tobacco Use  . Smoking status: Never Smoker  . Smokeless tobacco: Never Used  Substance Use Topics  . Alcohol use: Yes    Alcohol/week: 0.0 oz    Comment: a glass of wine/beer  . Drug use: No    Interim medical history since last visit reviewed. Allergies and medications reviewed  Review of Systems  Constitutional: Negative for unexpected weight change.  Cardiovascular: Negative for chest pain and leg swelling.  Musculoskeletal:       Doing well in PT; able to raise left arm   Per HPI unless specifically indicated above     Objective:    BP 122/82   Pulse 74   Temp 98.7 F (37.1 C) (Oral)   Resp 14   Ht 6' 3.5" (1.918 m)   Wt 269 lb 1.6 oz (122.1 kg)   SpO2 97%   BMI 33.19 kg/m   Wt Readings from Last 3 Encounters:  07/13/17 269 lb 1.6 oz (122.1 kg)  04/21/17 267 lb (121.1 kg)  04/14/17 271 lb 6.4 oz (123.1 kg)    Physical Exam  Constitutional: He appears well-developed and well-nourished. No distress.  HENT:  Head: Normocephalic and atraumatic.  Eyes: EOM are normal. No scleral icterus.  Neck: No thyromegaly present.  Cardiovascular: Normal rate and regular rhythm.  Pulses:      Dorsalis pedis pulses are 1+ on the right side, and 1+ on the left side.  Pulmonary/Chest: Effort normal and breath sounds normal.  Abdominal: Soft. Bowel sounds are normal. He exhibits no distension.  Musculoskeletal: He exhibits no edema.  Feet:  Right Foot:  Protective Sensation: 5 sites tested. 5 sites sensed.  Left Foot:  Protective Sensation: 5 sites tested. 5 sites sensed.  Neurological: Coordination normal.  Skin: Skin is warm and dry. No pallor.  Psychiatric: He has a normal mood and affect. His behavior is normal. Judgment and thought content normal.    Results for orders placed or performed during the hospital encounter of 04/21/17  Surgical  pathology  Result Value Ref Range   SURGICAL PATHOLOGY      Surgical Pathology CASE: [id number] PATIENT: Dean Sullivan Surgical Pathology Report     SPECIMEN SUBMITTED: A. Colon polyp, ascending; cbx  CLINICAL HISTORY: None provided  PRE-OPERATIVE DIAGNOSIS: Screening colonoscopy  POST-OPERATIVE DIAGNOSIS: Polyps     DIAGNOSIS: A. COLON POLYP, ASCENDING; COLD BIOPSY: -  TUBULAR ADENOMA. - NEGATIVE FOR HIGH GRADE DYSPLASIA AND MALIGNANCY.   GROSS DESCRIPTION:  A. Labeled: C ascending colon polyp  Tissue fragment(s): 2  Size: 0.3 cm  Description: in formalin, pink-tan fragments  Entirely submitted in 1 cassette(s).          Final Diagnosis performed by Quay Burow, MD.  Electronically signed 04/23/2017 8:04:16AM    The electronic signature indicates that the named Attending Pathologist has evaluated the specimen  Technical component performed at [facility], [address] [place], [place] [address] Lab: [phone] Dir: Rush Farmer, MD, MMM  Professional component performed at [facility],  [facility], [place], [place], [place] [address] Lab: [phone] Dir: Dellia Nims. Rubinas, MD        Assessment & Plan:   Problem List Items Addressed This Visit      Cardiovascular and Mediastinum   Hypertension goal BP (blood pressure) < 130/80    So glad he is not adding salt to foods; continue meds      Coronary artery disease involving native coronary artery of native heart without angina pectoris    Managed by Dr. Rockey Situ, cardiologist; no chest pain      Relevant Orders   Lipid panel     Endocrine   Type 2 diabetes mellitus (HCC) - Primary    Stable A1c; check today; if less than 7, next visit and labs in October; foot exam by MD today      Relevant Orders   Hemoglobin A1c     Other   Medication monitoring encounter   Relevant Orders   COMPLETE METABOLIC PANEL WITH GFR   Obesity    Working on this;  encouraged water      Hyperlipidemia LDL goal <70    Taking 5 mg daily (crestor) plus repatha; proud of patient's changes to dietary intake; work on weight loss       Other Visit Diagnoses    Need for diphtheria-tetanus-pertussis (Tdap) vaccine       Relevant Orders   Tdap vaccine greater than or equal to 7yo IM (Completed)   Right flank pain       check urine to r/o blood or protein; likely ergonomics based on description; patient will call if not better in 2 weeks with changes   Relevant Orders   Urinalysis w microscopic + reflex cultur       Follow up plan: Return in about 3 months (around 10/12/2017) for follow-up visit with Dr. Sanda Klein if A1c is uncontrolled; otherwise, we'll do 6 months.  An after-visit summary was printed and given to the patient at [facility].  Please see the patient instructions which may contain other information and recommendations beyond what is mentioned above in the assessment and plan.  No orders of the defined types were placed in this encounter.   Orders Placed This Encounter  Procedures  . Tdap vaccine greater than or equal to 7yo IM  . Lipid panel  . Hemoglobin A1c  . COMPLETE METABOLIC PANEL WITH GFR  . Urinalysis w microscopic + reflex cultur

## 2017-07-13 NOTE — Assessment & Plan Note (Signed)
Working on this; Immunologist

## 2017-07-13 NOTE — Assessment & Plan Note (Signed)
Taking 5 mg daily (crestor) plus repatha; proud of patient's changes to dietary intake; work on weight loss

## 2017-07-13 NOTE — Patient Instructions (Signed)
Check out the information at familydoctor.org entitled "Nutrition for Weight Loss: What You Need to Know about Fad Diets" Try to lose between 1-2 pounds per week by taking in fewer calories and burning off more calories You can succeed by limiting portions, limiting foods dense in calories and fat, becoming more active, and drinking 8 glasses of water a day (64 ounces) Don't skip meals, especially breakfast, as skipping meals may alter your metabolism Do not use over-the-counter weight loss pills or gimmicks that claim rapid weight loss A healthy BMI (or body mass index) is between 18.5 and 24.9 You can calculate your ideal BMI at the NIH website http://www.nhlbi.nih.gov/health/educational/lose_wt/BMI/bmicalc.htm  

## 2017-07-14 LAB — LIPID PANEL
Cholesterol: 70 mg/dL (ref <200)
HDL: 37 mg/dL — AB (ref >40)
LDL Cholesterol (Calc): 16 mg/dL (calc)
Non-HDL Cholesterol (Calc): 33 mg/dL (calc) (ref <130)
TRIGLYCERIDES: 85 mg/dL (ref <150)
Total CHOL/HDL Ratio: 1.9 (calc) (ref <5.0)

## 2017-07-14 LAB — URINALYSIS W MICROSCOPIC + REFLEX CULTURE
BACTERIA UA: NONE SEEN /HPF
Bilirubin Urine: NEGATIVE
GLUCOSE, UA: NEGATIVE
HYALINE CAST: NONE SEEN /LPF
Hgb urine dipstick: NEGATIVE
Ketones, ur: NEGATIVE
Leukocyte Esterase: NEGATIVE
Nitrites, Initial: NEGATIVE
PH: 6 (ref 5.0–8.0)
Protein, ur: NEGATIVE
RBC / HPF: NONE SEEN /HPF (ref 0–2)
SPECIFIC GRAVITY, URINE: 1.019 (ref 1.001–1.03)
Squamous Epithelial / LPF: NONE SEEN /HPF (ref <=5)
WBC, UA: NONE SEEN /HPF (ref 0–5)

## 2017-07-14 LAB — COMPLETE METABOLIC PANEL WITH GFR
AG RATIO: 1.7 (calc) (ref 1.0–2.5)
ALBUMIN MSPROF: 4.7 g/dL (ref 3.6–5.1)
ALT: 19 U/L (ref 9–46)
AST: 20 U/L (ref 10–35)
Alkaline phosphatase (APISO): 79 U/L (ref 40–115)
BILIRUBIN TOTAL: 1 mg/dL (ref 0.2–1.2)
BUN: 13 mg/dL (ref 7–25)
CALCIUM: 9.5 mg/dL (ref 8.6–10.3)
CHLORIDE: 108 mmol/L (ref 98–110)
CO2: 26 mmol/L (ref 20–32)
Creat: 0.93 mg/dL (ref 0.70–1.25)
GFR, EST AFRICAN AMERICAN: 103 mL/min/{1.73_m2} (ref >=60)
GFR, EST NON AFRICAN AMERICAN: 89 mL/min/{1.73_m2} (ref >=60)
GLUCOSE: 101 mg/dL — AB (ref 65–99)
Globulin: 2.8 g/dL (calc) (ref 1.9–3.7)
Potassium: 4 mmol/L (ref 3.5–5.3)
Sodium: 141 mmol/L (ref 135–146)
TOTAL PROTEIN: 7.5 g/dL (ref 6.1–8.1)

## 2017-07-14 LAB — NO CULTURE INDICATED

## 2017-07-14 LAB — HEMOGLOBIN A1C
EAG (MMOL/L): 7.3 (calc)
HEMOGLOBIN A1C: 6.2 %{Hb} — AB (ref <5.7)
MEAN PLASMA GLUCOSE: 131 (calc)

## 2017-09-07 ENCOUNTER — Encounter: Payer: Self-pay | Admitting: Cardiovascular Disease

## 2017-09-07 ENCOUNTER — Ambulatory Visit: Payer: BC Managed Care – PPO | Admitting: Cardiovascular Disease

## 2017-09-07 VITALS — BP 146/84 | HR 68 | Ht 75.5 in | Wt 267.0 lb

## 2017-09-07 DIAGNOSIS — Z955 Presence of coronary angioplasty implant and graft: Secondary | ICD-10-CM | POA: Diagnosis not present

## 2017-09-07 DIAGNOSIS — G4733 Obstructive sleep apnea (adult) (pediatric): Secondary | ICD-10-CM

## 2017-09-07 DIAGNOSIS — I25118 Atherosclerotic heart disease of native coronary artery with other forms of angina pectoris: Secondary | ICD-10-CM | POA: Diagnosis not present

## 2017-09-07 DIAGNOSIS — I10 Essential (primary) hypertension: Secondary | ICD-10-CM

## 2017-09-07 DIAGNOSIS — Z9989 Dependence on other enabling machines and devices: Secondary | ICD-10-CM

## 2017-09-07 DIAGNOSIS — E785 Hyperlipidemia, unspecified: Secondary | ICD-10-CM | POA: Diagnosis not present

## 2017-09-07 DIAGNOSIS — E1159 Type 2 diabetes mellitus with other circulatory complications: Secondary | ICD-10-CM

## 2017-09-07 DIAGNOSIS — I2510 Atherosclerotic heart disease of native coronary artery without angina pectoris: Secondary | ICD-10-CM | POA: Insufficient documentation

## 2017-09-07 MED ORDER — SILDENAFIL CITRATE 20 MG PO TABS: 20.0000 mg | ORAL_TABLET | Freq: Three times a day (TID) | ORAL | 6 refills | Status: DC

## 2017-09-07 NOTE — Progress Notes (Signed)
Patient ID: Dean Sullivan, male   DOB: 09-11-1955, 62 y.o.   MRN: [id number] Cardiology Office Note  Date:  09/07/2017   ID:  Dean Sullivan, DOB [birth year], MRN [id number]  PCP:  Arnetha Courser, MD   Chief Complaint  Patient presents with  . other    12 month f/u discuss ED. Meds reviewed verbally with pt.    HPI:  Dean Sullivan is a pleasant 62 year old gentleman with history of  coronary artery disease,   prior cardiac catheterization showing obstructive disease in his RCA and LAD, stent placed to each vessel  hyperlipidemia,  obstructive sleep apnea on CPAP,  presenting for Routine follow-up of his coronary artery disease   No  Chest pain, BP elevated today, better at home Having Ed problems  On crestor and repatha Total chol less than 100  Denies any anginal symptoms, no regular exercise program No shortness of breath on exertion  EKG personally reviewed by myself on todays visit Shows normal sinus rhythm rate 68 bpm nonspecific T wave abnormality  Other past medical history reviewed Continues to take Crestor 5 mg daily. He had myalgias on 20 mg Did not tolerate Zetia Previously had side effects on WelChol On repatha  Family hx of prostate cancer  Reports having 2 stents placed either in 2012 or 2013 at hospital in [place].   PMH:   has a past medical history of Allergy, Blockage of coronary artery bypass graft, Coronary arteriosclerosis in native artery, Diabetes mellitus without complication ([place]), GERD (gastroesophageal reflux disease), Glaucoma, Hearing loss of right ear, History of kidney stones, History of trigger finger, Hyperlipidemia, Hypertension, Hypertension goal BP (blood pressure) < 130/80 (01/05/2015), Irregular heart beat, Sleep apnea, and Type 2 diabetes mellitus ([place]) (03/18/2017).  PSH:    Past Surgical History:  Procedure Laterality Date  . CARDIAC CATHETERIZATION     x2 stents [place]  2012   2  .  COLONOSCOPY WITH PROPOFOL N/A 04/21/2017   Procedure: COLONOSCOPY WITH PROPOFOL;  Surgeon: Jonathon Bellows, MD;  Location: [facility] ENDOSCOPY;  Service: Gastroenterology;  Laterality: N/A;  . LIPOMA EXCISION Left 03/09/2017   Procedure: EXCISION LIPOMA;  Surgeon: Earnestine Leys, MD;  Location: [facility] ORS;  Service: Orthopedics;  Laterality: Left;  . RESECTION DISTAL CLAVICAL Left 03/09/2017   Procedure: RESECTION DISTAL CLAVICAL;  Surgeon: Earnestine Leys, MD;  Location: [facility] ORS;  Service: Orthopedics;  Laterality: Left;  . SHOULDER ARTHROSCOPY WITH OPEN ROTATOR CUFF REPAIR Left 03/09/2017   Procedure: SHOULDER ARTHROSCOPY WITH OPEN ROTATOR CUFF REPAIR;  Surgeon: Earnestine Leys, MD;  Location: [facility] ORS;  Service: Orthopedics;  Laterality: Left;  . SUBACROMIAL DECOMPRESSION Left 03/09/2017   Procedure: SUBACROMIAL DECOMPRESSION;  Surgeon: Earnestine Leys, MD;  Location: [facility] ORS;  Service: Orthopedics;  Laterality: Left;  BICEPS TENOTOMY    Current Outpatient Medications  Medication Sig Dispense Refill  . aspirin 81 MG tablet Take 81 mg by mouth daily.    . cetirizine (ZYRTEC) 10 MG tablet Take 10 mg by mouth daily.    . Cholecalciferol (VITAMIN D) 2000 UNITS tablet Take 2,000 Units by mouth daily.     . Evolocumab (REPATHA SURECLICK) 916 MG/ML SOAJ Inject 1 pen into the skin every 14 (fourteen) days. 2 pen 11  . IBUPROFEN PO Take by mouth as needed.    . latanoprost (XALATAN) 0.005 % ophthalmic solution Place 1 drop into both eyes at bedtime.     Marland Kitchen losartan (COZAAR) 50 MG tablet Take 25 mg by mouth  daily.     . rosuvastatin (CRESTOR) 5 MG tablet Take 1 tablet (5 mg total) by mouth daily. (Patient taking differently: Take 5 mg by mouth at bedtime. ) 90 tablet 3  . sildenafil (REVATIO) 20 MG tablet Take 1 tablet (20 mg total) by mouth 3 (three) times daily. 90 tablet 6   No current facility-administered medications for this visit.      Allergies:   Patient has no known allergies.   Social History:   The patient  reports that he has never smoked. He has never used smokeless tobacco. He reports that he drinks alcohol. He reports that he does not use drugs.   Family History:   family history includes Alcohol abuse in his father; Arthritis in his maternal grandmother; Cancer in his father; Diabetes in his mother; Heart Problems in his father; Other in his maternal aunt; Stroke in his father.    Review of Systems: Review of Systems  Constitutional: Negative.   Respiratory: Negative.   Cardiovascular: Negative.   Gastrointestinal: Negative.   Musculoskeletal: Positive for joint pain.  Neurological: Negative.   Psychiatric/Behavioral: Negative.   All other systems reviewed and are negative.    PHYSICAL EXAM: VS:  BP (!) 146/84 (BP Location: Left Arm, Patient Position: Sitting, Cuff Size: Large)   Pulse 68   Ht 6' 3.5" (1.918 m)   Wt 267 lb (121.1 kg)   BMI 32.93 kg/m  , BMI Body mass index is 32.93 kg/m. Constitutional:  oriented to person, place, and time. No distress.  HENT:  Head: Normocephalic and atraumatic.  Eyes:  no discharge. No scleral icterus.  Neck: Normal range of motion. Neck supple. No JVD present.  Cardiovascular: Normal rate, regular rhythm, normal heart sounds and intact distal pulses. Exam reveals no gallop and no friction rub. No edema No murmur heard. Pulmonary/Chest: Effort normal and breath sounds normal. No stridor. No respiratory distress.  no wheezes.  no rales.  no tenderness.  Abdominal: Soft.  no distension.  no tenderness.  Musculoskeletal: Normal range of motion.  no  tenderness or deformity.  Neurological:  normal muscle tone. Coordination normal. No atrophy Skin: Skin is warm and dry. No rash noted. not diaphoretic.  Psychiatric:  normal mood and affect. behavior is normal. Thought content normal.       Recent Labs: 02/19/2017: Hemoglobin 13.1; Platelets 258 07/13/2017: ALT 19; BUN 13; Creat 0.93; Potassium 4.0; Sodium 141    Lipid  Panel Lab Results  Component Value Date   CHOL 70 07/13/2017   HDL 37 (L) 07/13/2017   LDLCALC 16 07/13/2017   TRIG 85 07/13/2017      Wt Readings from Last 3 Encounters:  09/07/17 267 lb (121.1 kg)  07/13/17 269 lb 1.6 oz (122.1 kg)  04/21/17 267 lb (121.1 kg)       ASSESSMENT AND PLAN:  Coronary artery disease involving native coronary artery of native heart without angina pectoris - Currently with no symptoms of angina. No further workup at this time. Continue current medication regimen.stable  Hypertension goal BP (blood pressure) < 140/90 Blood pressure elevated today but he reports is well controlled at home He will monitor at home and call us with some numbers  Hyperlipidemia LDL goal <70 On repatha and Crestor 5 mg daily Numbers are remarkably low Asymptomatic though would be worth checking testosterone level  Status post coronary artery stent placement Continue aspirin, aggressive cholesterol management No further testing, no anginal symptoms  Obesity, Class I, BMI 30.0-34.9 (see actual  BMI) Recommended dietary changes, walking program  Obstructive sleep apnea on CPAP  Erectile dysfunction Prescription provided for Viagra generic Long discussion concerning mechanism of erectile dysfunction Recommended he have testosterone checked. We have offered to do this but he prefers to discuss this with Dr. Sanda Klein   Total encounter time more than 25 minutes  Greater than 50% was spent in counseling and coordination of care with the patient  Disposition:   F/U  12 months   Orders Placed This Encounter  Procedures  . EKG 12-Lead     Signed, Esmond Plants, M.D., Ph.D. 09/07/2017  [place], [place]

## 2017-09-07 NOTE — Patient Instructions (Addendum)
Medication Instructions:   We will send in a script for revatio/generic viagra  Take 3 to 5 pills at a time PRN  Labwork:  No new labs needed  Testing/Procedures:  No further testing at this time   Follow-Up: It was a pleasure seeing you in the office today. Please call us if you have new issues that need to be addressed before your next appt.  [phone]  Your physician wants you to follow-up in: 12 months.  You will receive a reminder letter in the mail two months in advance. If you don't receive a letter, please call our office to schedule the follow-up appointment.  If you need a refill on your cardiac medications before your next appointment, please call your pharmacy.  For educational health videos Log in to : [email or web address] Or : [email or web address], password : [email or web address]

## 2017-09-10 ENCOUNTER — Other Ambulatory Visit: Payer: Self-pay | Admitting: Cardiovascular Disease

## 2017-09-10 NOTE — Telephone Encounter (Signed)
Please review for refill, Thanks !  

## 2017-10-26 ENCOUNTER — Telehealth: Payer: Self-pay | Admitting: *Deleted

## 2017-10-26 ENCOUNTER — Other Ambulatory Visit: Payer: Self-pay

## 2017-10-26 MED ORDER — EVOLOCUMAB 140 MG/ML ~~LOC~~ SOAJ: 1.0000 "pen " | SUBCUTANEOUS | 9 refills | Status: DC

## 2017-10-26 NOTE — Telephone Encounter (Signed)
*  STAT* If patient is at the pharmacy, call can be transferred to refill team.   1. Which medications need to be refilled? (please list name of each medication and dose if known) Repatha  2. Which pharmacy/location (including street and city if local pharmacy) is medication to be sent to? Pt states this is mailed directly to [place]  3. Do they need a 30 day or 90 day supply?

## 2017-10-26 NOTE — Telephone Encounter (Signed)
I spoke with pt today and he mentioned that he was told he needed a PA on his [place]. He is due for ij. This Wednesday but is unable to get his medication due to PA. Pt mentioned that he would like to transfer his Rx to his local Pharmacy if available to [place]. He mentioned that he used a copay card with his [facility] and was wondering if he would be able to use the same co-pay card or would he need a new one. He is aware that we will contact his pharmacy.

## 2017-10-26 NOTE — Telephone Encounter (Signed)
LMOVM to contact office concerning Repatha.

## 2017-10-26 NOTE — Telephone Encounter (Signed)
PA has been initiated for Repatha ij. Awaiting approval from pharmacist can take up to 24 hrs. [phone]

## 2017-10-26 NOTE — Telephone Encounter (Signed)
Awaiting Approval. Rx refill sent to [facility] to have on file once pt is approved for Medication per Pt request.

## 2017-10-26 NOTE — Telephone Encounter (Signed)
     PA has been initiated for Repatha ij. Awaiting approval from pharmacist can take up to 24 hrs. [phone]       Documentation          I spoke with pt today and he mentioned that he was told he needed a PA on his [place]. He is due for ij. This Wednesday but is unable to get his medication due to PA. Pt mentioned that he would like to transfer his Rx to his local Pharmacy if available to [place]. He mentioned that he used a copay card with his current [facility] and was wondering if he would be able to use the same co-pay card or would he need a new one. He is aware that we will contact his pharmacy.      Documentation

## 2017-10-26 NOTE — Telephone Encounter (Signed)
Requested Prescriptions   Signed Prescriptions Disp Refills  . Evolocumab (REPATHA SURECLICK) 258 MG/ML SOAJ 2 pen 9    Sig: Inject 1 pen into the skin every 14 (fourteen) days.    Authorizing Provider: Minna Merritts    Ordering User: Britt Bottom

## 2017-10-26 NOTE — Telephone Encounter (Signed)
Patient calling to let us know he [facility] and they will be delivering his Repatha tomorrow

## 2017-10-27 ENCOUNTER — Telehealth: Payer: Self-pay | Admitting: Cardiovascular Disease

## 2017-10-27 NOTE — Telephone Encounter (Signed)
New Message:       Received message from answering services that patient needs the Dr. To send his insurance and approval for his repatha due to his shot coming up on Wednesday.

## 2017-10-27 NOTE — Telephone Encounter (Signed)
Per insurance no PA required. See phone note from yesterday with submission of PA. Unless insurance requires otherwise should be able to fill at [facility] with same [place] card.

## 2017-10-27 NOTE — Telephone Encounter (Signed)
PA re-submitted to insurance.

## 2017-10-28 NOTE — Telephone Encounter (Signed)
Pt approved for Repatha 10/26/17-10/27/18.

## 2018-01-05 ENCOUNTER — Other Ambulatory Visit: Payer: Self-pay | Admitting: Cardiovascular Disease

## 2018-01-25 ENCOUNTER — Ambulatory Visit: Payer: BC Managed Care – PPO | Admitting: Nurse Practitioner

## 2018-01-25 ENCOUNTER — Ambulatory Visit: Payer: Self-pay | Admitting: *Deleted

## 2018-01-25 ENCOUNTER — Encounter: Payer: Self-pay | Admitting: Nurse Practitioner

## 2018-01-25 VITALS — BP 140/76 | HR 80 | Temp 98.9°F | Resp 16 | Ht 75.5 in | Wt 275.6 lb

## 2018-01-25 DIAGNOSIS — R002 Palpitations: Secondary | ICD-10-CM | POA: Diagnosis not present

## 2018-01-25 DIAGNOSIS — E119 Type 2 diabetes mellitus without complications: Secondary | ICD-10-CM

## 2018-01-25 DIAGNOSIS — E559 Vitamin D deficiency, unspecified: Secondary | ICD-10-CM

## 2018-01-25 DIAGNOSIS — R5383 Other fatigue: Secondary | ICD-10-CM

## 2018-01-25 DIAGNOSIS — E785 Hyperlipidemia, unspecified: Secondary | ICD-10-CM

## 2018-01-25 NOTE — Telephone Encounter (Signed)
He called in c/o at times he feels like his heart is beating fast and he can feel it in his chest and upper back.   He said 2 weekends ago he had left shoulder pain however he had a left rotator cuff repair a year ago and he thinks it is that.   He is still healing from that. He also said he would have some pain in left shoulder with deep breaths 2 weekends ago but not since.   Denies having chest pain/discomfort.   "I mainly am feeling more tired than usual with like walking through [employer] and all".    It's a hard sensation to explain. He had 2 stents put in 5 years ago.     I scheduled him with Suezanne Cheshire, NP for this morning 8:40.   I instructed him to go to the ED if his symptoms became worse.    He verbalized understanding and was agreeable to this plan.  His PCP Dr. Sanda Klein was booked all week.     Reason for Disposition . [1] Chest pain lasts > 5 minutes AND [2] occurred > 3 days ago (72 hours) AND [3] NO chest pain or cardiac symptoms now  Answer Assessment - Initial Assessment Questions 1. LOCATION: "Where does it hurt?"       Having it a while.   Weekend before last every time I would breath I would have a pain on left shoulder every time I would breath.   I take BP medication. At night my heart is beating fast.    I'm am being tired lately.   It's not a pain in my chest it beats fast then it slows down.   I feel my heart beating in my back.   I use a CPAP machine.    No pain in my heart of shoulder but it feels different.    2. RADIATION: "Does the pain go anywhere else?" (e.g., into neck, jaw, arms, back)     I had rotator cuff repair left shoulder a year ago.   I wondered if it was that.  I've been more tired than usual.   I'm not really having chest pain. 3. ONSET: "When did the chest pain begin?" (Minutes, hours or days)      2 weeks ago I'm feeling more tired.   No chest pain.   I feel like a bubbling or tingling across my chest. 4. PATTERN "Does the pain come and go, or  has it been constant since it started?"  "Does it get worse with exertion?"      Intermittent.   I feel it when I lay down.   I feel like my chest is "rumbling"/. 5. DURATION: "How long does it last" (e.g., seconds, minutes, hours)     6. SEVERITY: "How bad is the pain?"  (e.g., Scale 1-10; mild, moderate, or severe)    - MILD (1-3): doesn't interfere with normal activities     - MODERATE (4-7): interferes with normal activities or awakens from sleep    - SEVERE (8-10): excruciating pain, unable to do any normal activities       *No Answer* 7. CARDIAC RISK FACTORS: "Do you have any history of heart problems or risk factors for heart disease?" (e.g., prior heart attack, angina; high blood pressure, diabetes, being overweight, high cholesterol, smoking, or strong family history of heart disease)     Yes.   I had stents put in about  5 years ago.  2  stents 8. PULMONARY RISK FACTORS: "Do you have any history of lung disease?"  (e.g., blood clots in lung, asthma, emphysema, birth control pills)     No 9. CAUSE: "What do you think is causing the chest pain?"     I don't know.     10. OTHER SYMPTOMS: "Do you have any other symptoms?" (e.g., dizziness, nausea, vomiting, sweating, fever, difficulty breathing, cough)       No vomiting.    No sweating.    I tire easily. 11. PREGNANCY: "Is there any chance you are pregnant?" "When was your last menstrual period?"       N/A  Protocols used: CHEST PAIN-A-AH

## 2018-01-25 NOTE — Progress Notes (Signed)
Name: Dean Sullivan   MRN: [id number]    DOB: 06-03-1955   Date:01/25/2018       Progress Note  Subjective  Chief Complaint  Chief Complaint  Patient presents with  . Tachycardia  . Fatigue    HPI  States was doing some home improvement at his moms house, started to have left shoulder pain 2 weeks ago, that self resolved. States ever since he had stents placed can hear heart beat. States in the last 2 weeks when he lays down he can feel his heart beating- sometimes feels skipped beat feels it in his chest and back- takes 1/2 tab of losartan states whole tab makes him too drowsy. When he take the other 1/2 tab of losartan it seems to resolve. Notices more fatigue and lethargy in the past few weeks.  Hx of stent in RCA and LAD in 2012, has hyperlipidemia takes repatha and crestor, OSA- wears CPAP Patient sees Dr. Rockey Situ; cardiologist- last appointment was 09/07/2017. EKG at that time showed NSR.   Patient Active Problem List   Diagnosis Date Noted  . CAD (coronary artery disease), native coronary artery 09/07/2017  . Type 2 diabetes mellitus ([place]) 03/18/2017  . Colon cancer screening 02/19/2017  . Screening for prostate cancer 09/10/2016  . Medication monitoring encounter 03/12/2016  . Trigger finger of right hand 02/20/2015  . Hyperlipidemia LDL goal <70 01/05/2015  . Status post coronary artery stent placement 01/05/2015  . Hypertension goal BP (blood pressure) < 130/80 01/05/2015  . Obesity 01/05/2015  . Abdominal obesity-metabolic syndrome type 3 01/60/1093  . Nasal sinus congestion 01/05/2015  . Right foot pain 01/05/2015  . Obstructive sleep apnea on CPAP 01/05/2015    Past Medical History:  Diagnosis Date  . Allergy   . Blockage of coronary artery bypass graft    patient stated that he had blockages - stents were inserted.  . Coronary arteriosclerosis in native artery   . Diabetes mellitus without complication ([place])   . GERD (gastroesophageal reflux disease)    occ   . Glaucoma   . Hearing loss of right ear    patient was told he had slight hearing loss  . History of kidney stones    h/o  . History of trigger finger    bilateral, but mostly in right thumb  . Hyperlipidemia   . Hypertension   . Hypertension goal BP (blood pressure) < 130/80 01/05/2015  . Irregular heart beat   . Sleep apnea    cpap  . Type 2 diabetes mellitus ([place]) 03/18/2017   A1c 6.28 Jan 2017    Past Surgical History:  Procedure Laterality Date  . CARDIAC CATHETERIZATION     x2 stents [place]  2012   2  . COLONOSCOPY WITH PROPOFOL N/A 04/21/2017   Procedure: COLONOSCOPY WITH PROPOFOL;  Surgeon: Jonathon Bellows, MD;  Location[facility] ENDOSCOPY;  Service: Gastroenterology;  Laterality: N/A;  . LIPOMA EXCISION Left 03/09/2017   Procedure: EXCISION LIPOMA;  Surgeon: Earnestine Leys, MD;  Location: [facility] ORS;  Service: Orthopedics;  Laterality: Left;  . RESECTION DISTAL CLAVICAL Left 03/09/2017   Procedure: RESECTION DISTAL CLAVICAL;  Surgeon: Earnestine Leys, MD;  Location: [facility] ORS;  Service: Orthopedics;  Laterality: Left;  . SHOULDER ARTHROSCOPY WITH OPEN ROTATOR CUFF REPAIR Left 03/09/2017   Procedure: SHOULDER ARTHROSCOPY WITH OPEN ROTATOR CUFF REPAIR;  Surgeon: Earnestine Leys, MD;  Location: [facility] ORS;  Service: Orthopedics;  Laterality: Left;  . SUBACROMIAL DECOMPRESSION Left 03/09/2017  Procedure: SUBACROMIAL DECOMPRESSION;  Surgeon: Earnestine Leys, MD;  Location: [facility] ORS;  Service: Orthopedics;  Laterality: Left;  BICEPS TENOTOMY    Social History   Tobacco Use  . Smoking status: Never Smoker  . Smokeless tobacco: Never Used  Substance Use Topics  . Alcohol use: Yes    Alcohol/week: 0.0 standard drinks    Comment: a glass of wine/beer     Current Outpatient Medications:  .  aspirin 81 MG tablet, Take 81 mg by mouth daily., Disp: , Rfl:  .  cetirizine (ZYRTEC) 10 MG tablet, Take 10 mg by mouth daily., Disp: , Rfl:  .   Cholecalciferol (VITAMIN D) 2000 UNITS tablet, Take 2,000 Units by mouth daily. , Disp: , Rfl:  .  Evolocumab (REPATHA SURECLICK) 563 MG/ML SOAJ, Inject 1 pen into the skin every 14 (fourteen) days., Disp: 2 pen, Rfl: 9 .  IBUPROFEN PO, Take by mouth as needed., Disp: , Rfl:  .  latanoprost (XALATAN) 0.005 % ophthalmic solution, Place 1 drop into both eyes at bedtime. , Disp: , Rfl:  .  losartan (COZAAR) 50 MG tablet, Take 25 mg by mouth daily. , Disp: , Rfl:  .  losartan (COZAAR) 50 MG tablet, TAKE 1 TABLET BY MOUTH ONCE DAILY, Disp: 90 tablet, Rfl: 2 .  rosuvastatin (CRESTOR) 5 MG tablet, Take 1 tablet (5 mg total) by mouth daily. (Patient taking differently: Take 5 mg by mouth at bedtime. ), Disp: 90 tablet, Rfl: 3 .  sildenafil (REVATIO) 20 MG tablet, Take 1 tablet (20 mg total) by mouth 3 (three) times daily., Disp: 90 tablet, Rfl: 6  No Known Allergies  Review of Systems  Constitutional: Positive for malaise/fatigue. Negative for chills and fever.  Eyes: Negative for blurred vision and double vision.  Respiratory: Negative for shortness of breath.   Cardiovascular: Positive for chest pain and palpitations. Negative for orthopnea, claudication and leg swelling.  Gastrointestinal: Negative for abdominal pain.  Musculoskeletal: Negative for myalgias.  Skin: Negative for rash.  Neurological: Negative for dizziness, tingling and headaches.  Psychiatric/Behavioral: The patient is not nervous/anxious and does not have insomnia.      No other specific complaints in a complete review of systems (except as listed in HPI above).  Objective  Vitals:   01/25/18 0849  BP: 140/76  Pulse: 80  Resp: 16  Temp: 98.9 F (37.2 C)  TempSrc: Oral  SpO2: 99%  Weight: 275 lb 9.6 oz (125 kg)  Height: 6' 3.5" (1.918 m)    Body mass index is 33.99 kg/m.  Nursing Note and Vital Signs reviewed.  Physical Exam  Constitutional: He is oriented to person, place, and time. He appears well-developed  and well-nourished.  HENT:  Head: Normocephalic and atraumatic.  Eyes: Pupils are equal, round, and reactive to light. Conjunctivae and EOM are normal.  Neck: Normal range of motion. Neck supple. Carotid bruit is not present.  Cardiovascular: Normal heart sounds and intact distal pulses.  Pulmonary/Chest: Effort normal and breath sounds normal. He has no wheezes.  Abdominal: Soft. Bowel sounds are normal. There is no tenderness. There is no CVA tenderness.  Musculoskeletal: Normal range of motion.  Neurological: He is alert and oriented to person, place, and time. He has normal strength. No sensory deficit. GCS eye subscore is 4. GCS verbal subscore is 5. GCS motor subscore is 6.  Skin: Skin is warm, dry and intact. Capillary refill takes less than 2 seconds.  Psychiatric: He has a normal mood and affect. His speech  is normal and behavior is normal. Judgment and thought content normal.  Vitals reviewed.   No results found for this or any previous visit (from the past 48 hour(s)).  Assessment & Plan  1. Palpitations Avoid caffeine, drink plenty of water, follow up in one week.  - BASIC METABOLIC PANEL WITH GFR - CBC - TSH - EKG 12-Lead : Sinus rhythm with PVC  2. Fatigue, unspecified type - BASIC METABOLIC PANEL WITH GFR - CBC - TSH - Testosterone - Vitamin D (25 hydroxy)  3. Type 2 diabetes mellitus without complication, without long-term current use of insulin ([place]) Will discuss in one week follow up  - HgB A1c  4. Hyperlipidemia LDL goal <70 Will discuss in one week follow up  - Lipid Profile  5. Vitamin D deficiency Will discuss in one week follow up  - Vitamin D (25 hydroxy)    -Red flags and when to present for emergency care or RTC including dizziness, unilateral weakness, chest pain, shortness of breath, new/worsening/un-resolving symptoms, reviewed with patient at time of visit. Follow up and care instructions discussed and provided in AVS. -Reviewed Health  Maintenance: declined flu; states has already received pneumonia vaccine

## 2018-01-25 NOTE — Patient Instructions (Addendum)
- drink at least 64 ounces of water daily - Cut out caffeine for one week; including chocolate and chocolate flavored foods.  - Please document what you are doing before and when episodes are happening and how long they last - Follow up in one week for palpitations and routine.  - If having palpitations with shortness of breath, or chest pain or dizziness please seek emergency care.      Premature Ventricular Contraction A premature ventricular contraction (PVC) is a common irregularity in the normal heart rhythm. These contractions are extra heartbeats that start in the heart ventricles and occur too early in the normal sequence. During the PVC, the heart's normal electrical pathway is not used, so the beat is shorter and less effective. In most cases, these contractions come and go and do not require treatment. What are the causes? In many cases, the cause may not be known. Common causes of the condition include:  Smoking.  Drinking alcohol.  Caffeine.  Certain medicines.  Some illegal drugs.  Stress.  Certain medical conditions can also cause PVCs:  Changes in minerals in the blood (electrolytes).  Heart failure.  Heart valve problems.  Low blood oxygen levels or high carbon dioxide levels.  Heart attack, or coronary artery disease.  What are the signs or symptoms? The main symptom of this condition is a fast or skipped heartbeat (palpitations). Other symptoms include:  Chest pain.  Shortness of breath.  Feeling tired.  Dizziness.  In some cases, there are no symptoms. How is this diagnosed? This condition may be diagnosed based on:  Your medical history.  A physical exam. During the exam, the health care provider will check for irregular heartbeats.  Tests, such as: ? An ECG (electrocardiogram) to monitor the electrical activity of your heart. ? Holter monitor testing. This involves wearing a device that clips to your clothing and monitors the electrical  activity of your heart over longer periods of time. ? Stress tests to see how exercise affects your heart rhythm and blood supply. ? Echocardiogram. This test uses sound waves (ultrasound) to produce an image of your heart. ? Electrophysiology study. This test checks the electric pathways in your heart.  How is this treated? Treatment depends on any underlying conditions, the type of PVCs that you are having, and how much the symptoms are interfering with your daily life. Possible treatments include:  Avoiding things that can trigger the premature contractions, such as caffeine or alcohol.  Medicines. These may be given if symptoms are severe or if the extra heartbeats are frequent.  Treatment for any underlying condition that is found to be the cause of the contractions.  Catheter ablation. This procedure destroys the heart tissues that send abnormal signals.  In some cases, no treatment is required. Follow these instructions at home: Lifestyle Follow these instructions as told by your health care provider:  Do not use any products that contain nicotine or tobacco, such as cigarettes and e-cigarettes. If you need help quitting, ask your health care provider.  If caffeine triggers episodes of PVC, do not eat, drink, or use anything with caffeine in it.  If caffeine does not seem to trigger episodes, consume caffeine in moderation.  If alcohol triggers episodes of PVC, do not drink alcohol.  If alcohol does not seem to trigger episodes, limit alcohol intake to no more than 1 drink a day for nonpregnant women and 2 drinks a day for men. One drink equals 12 oz of beer, 5 oz  of wine, or 1 oz of hard liquor.  Exercise regularly. Ask your health care provider what type of exercise is safe for you.  Find healthy ways to manage stress. Avoid stressful situations when possible.  Try to get at least 7-9 hours of sleep each night, or as much as recommended by your health care  provider.  Do not use illegal drugs.  General instructions  Take over-the-counter and prescription medicines only as told by your health care provider.  Keep all follow-up visits as told by your health care provider. This is important. Get help right away if:  You feel palpitations that are frequent or continual.  You have chest pain.  You have shortness of breath.  You have sweating for no reason.  You have nausea and vomiting.  You become light-headed or you faint. This information is not intended to replace advice given to you by your health care provider. Make sure you discuss any questions you have with your health care provider. Document Released: 10/26/2003 Document Revised: 11/02/2015 Document Reviewed: 08/15/2015 Elsevier Interactive Patient Education  [employer].

## 2018-01-26 ENCOUNTER — Other Ambulatory Visit: Payer: Self-pay | Admitting: Nurse Practitioner

## 2018-01-26 LAB — BASIC METABOLIC PANEL WITH GFR
BUN: 12 mg/dL (ref 7–25)
CO2: 24 mmol/L (ref 20–32)
CREATININE: 0.93 mg/dL (ref 0.70–1.25)
Calcium: 9.6 mg/dL (ref 8.6–10.3)
Chloride: 105 mmol/L (ref 98–110)
GFR, EST NON AFRICAN AMERICAN: 88 mL/min/{1.73_m2} (ref >=60)
GFR, Est African American: 102 mL/min/{1.73_m2} (ref >=60)
Glucose, Bld: 102 mg/dL — ABNORMAL HIGH (ref 65–99)
POTASSIUM: 3.9 mmol/L (ref 3.5–5.3)
SODIUM: 138 mmol/L (ref 135–146)

## 2018-01-26 LAB — TSH: TSH: 1.32 mIU/L (ref 0.40–4.50)

## 2018-01-26 LAB — LIPID PANEL
CHOL/HDL RATIO: 3 (calc) (ref <5.0)
Cholesterol: 139 mg/dL (ref <200)
HDL: 46 mg/dL (ref >40)
LDL Cholesterol (Calc): 70 mg/dL (calc)
NON-HDL CHOLESTEROL (CALC): 93 mg/dL (ref <130)
Triglycerides: 156 mg/dL — ABNORMAL HIGH (ref <150)

## 2018-01-26 LAB — CBC
HEMATOCRIT: 40.9 % (ref 38.5–50.0)
Hemoglobin: 13.9 g/dL (ref 13.2–17.1)
MCH: 28.3 pg (ref 27.0–33.0)
MCHC: 34 g/dL (ref 32.0–36.0)
MCV: 83.3 fL (ref 80.0–100.0)
MPV: 10.6 fL (ref 7.5–12.5)
PLATELETS: 261 10*3/uL (ref 140–400)
RBC: 4.91 10*6/uL (ref 4.20–5.80)
RDW: 14.3 % (ref 11.0–15.0)
WBC: 4.7 10*3/uL (ref 3.8–10.8)

## 2018-01-26 LAB — TESTOSTERONE: Testosterone: 199 ng/dL — ABNORMAL LOW (ref 250–827)

## 2018-01-26 LAB — VITAMIN D 25 HYDROXY (VIT D DEFICIENCY, FRACTURES): VIT D 25 HYDROXY: 34 ng/mL (ref 30–100)

## 2018-01-26 LAB — HEMOGLOBIN A1C W/OUT EAG: Hgb A1c MFr Bld: 6.3 % of total Hgb — ABNORMAL HIGH (ref <5.7)

## 2018-02-03 ENCOUNTER — Ambulatory Visit: Payer: BC Managed Care – PPO | Admitting: Family Medicine

## 2018-02-03 ENCOUNTER — Encounter: Payer: Self-pay | Admitting: Family Medicine

## 2018-02-03 VITALS — BP 118/66 | HR 83 | Temp 98.7°F | Ht 76.0 in | Wt 273.4 lb

## 2018-02-03 DIAGNOSIS — E291 Testicular hypofunction: Secondary | ICD-10-CM | POA: Diagnosis not present

## 2018-02-03 DIAGNOSIS — E6609 Other obesity due to excess calories: Secondary | ICD-10-CM

## 2018-02-03 DIAGNOSIS — I2510 Atherosclerotic heart disease of native coronary artery without angina pectoris: Secondary | ICD-10-CM | POA: Diagnosis not present

## 2018-02-03 DIAGNOSIS — I10 Essential (primary) hypertension: Secondary | ICD-10-CM

## 2018-02-03 DIAGNOSIS — Z6833 Body mass index (BMI) 33.0-33.9, adult: Secondary | ICD-10-CM

## 2018-02-03 DIAGNOSIS — E785 Hyperlipidemia, unspecified: Secondary | ICD-10-CM | POA: Diagnosis not present

## 2018-02-03 DIAGNOSIS — E1159 Type 2 diabetes mellitus with other circulatory complications: Secondary | ICD-10-CM | POA: Diagnosis not present

## 2018-02-03 MED ORDER — FAMOTIDINE 20 MG PO TABS: 20.0000 mg | ORAL_TABLET | Freq: Every day | ORAL | 5 refills | Status: DC | PRN

## 2018-02-03 NOTE — Assessment & Plan Note (Signed)
Patient will start back on his Crestor; continue Repatha; f/u with cardiologist

## 2018-02-03 NOTE — Progress Notes (Signed)
BP 118/66   Pulse 83   Temp 98.7 F (37.1 C)   Ht 6\' 4"  (1.93 m)   Wt 273 lb 6.4 oz (124 kg)   SpO2 99%   BMI 33.28 kg/m    Subjective:    Patient ID: Dean Sullivan, male    DOB: Jul 22, 1955, 62 y.o.   MRN: [id number]  HPI: Dean Sullivan is a 62 y.o. male  Chief Complaint  Patient presents with  . Follow-up    HPI Patient is here for f/u of palpitations; he saw Suezanne Cheshire, DNP last week Note reviewed Skipped beats, feels it in his chest and can feel his heart beating when he lays down Feels more lethargic the last few weeks; had been doing home improvement in mother's house Hx of stent for CAD in 2012, saw cardiologist Dr. Rockey Situ in June; EKG showed NSR He was advised to avoid caffeine, drink plenty of water, f/u in one week Labs were done, as well as EKG at that visit; reviewed Cut down the caffeine since last visit Feeling better; had some discomfort over the left shoulder when breathing in after doing the work and going to [employer] homecoming; that has completely resolved; that has not come back at all Taking whole pill of BP pill and that's helping the heart beating rapidly; no chest pain  Type 2 diabetes; excellent control; cutting out sugars; cut out starches; splenda if needed  Lab Results  Component Value Date   HGBA1C 6.3 (H) 01/25/2018   High cholesterol; he is on Repatha, stopped taking the 5 mg of the crestor a while back; no problems on the statin; I reviewed his lipid panels with him and noted that his LDL rose from 16 to 70 this time  Low testosterone; level was 199; because of his repatha, that is probably affected by all of that  Depression screen [facility] 2/9 02/03/2018 07/13/2017 03/31/2017 02/19/2017 09/10/2016  Decreased Interest 0 0 0 0 0  Down, Depressed, Hopeless 0 0 0 0 0  PHQ - 2 Score 0 0 0 0 0  Altered sleeping 0 - - - -  Tired, decreased energy 0 - - - -  Change in appetite 0 - - - -  Feeling bad or failure about yourself  0 - - - -    Trouble concentrating 0 - - - -  Moving slowly or fidgety/restless 0 - - - -  Suicidal thoughts 0 - - - -  PHQ-9 Score 0 - - - -  Difficult doing work/chores Not difficult at all - - - -   Fall Risk  02/03/2018 07/13/2017 03/31/2017 02/19/2017 09/10/2016  Falls in the past year? 0 No No No No    Relevant past medical, surgical, family and social history reviewed Past Medical History:  Diagnosis Date  . Allergy   . Blockage of coronary artery bypass graft    patient stated that he had blockages - stents were inserted.  . Coronary arteriosclerosis in native artery   . Diabetes mellitus without complication ([place])   . GERD (gastroesophageal reflux disease)    occ  . Glaucoma   . Hearing loss of right ear    patient was told he had slight hearing loss  . History of kidney stones    h/o  . History of trigger finger    bilateral, but mostly in right thumb  . Hyperlipidemia   . Hypertension   . Hypertension goal BP (blood pressure) < 130/80 01/05/2015  . Irregular  heart beat   . Sleep apnea    cpap  . Type 2 diabetes mellitus ([place]) 03/18/2017   A1c 6.28 Jan 2017   Past Surgical History:  Procedure Laterality Date  . CARDIAC CATHETERIZATION     x2 stents [place]  2012   2  . COLONOSCOPY WITH PROPOFOL N/A 04/21/2017   Procedure: COLONOSCOPY WITH PROPOFOL;  Surgeon: Jonathon Bellows, MD;  Location: [facility] ENDOSCOPY;  Service: Gastroenterology;  Laterality: N/A;  . LIPOMA EXCISION Left 03/09/2017   Procedure: EXCISION LIPOMA;  Surgeon: Earnestine Leys, MD;  Location: [facility] ORS;  Service: Orthopedics;  Laterality: Left;  . RESECTION DISTAL CLAVICAL Left 03/09/2017   Procedure: RESECTION DISTAL CLAVICAL;  Surgeon: Earnestine Leys, MD;  Location: [facility] ORS;  Service: Orthopedics;  Laterality: Left;  . SHOULDER ARTHROSCOPY WITH OPEN ROTATOR CUFF REPAIR Left 03/09/2017   Procedure: SHOULDER ARTHROSCOPY WITH OPEN ROTATOR CUFF REPAIR;  Surgeon: Earnestine Leys,  MD;  Location: [facility] ORS;  Service: Orthopedics;  Laterality: Left;  . SUBACROMIAL DECOMPRESSION Left 03/09/2017   Procedure: SUBACROMIAL DECOMPRESSION;  Surgeon: Earnestine Leys, MD;  Location: [facility] ORS;  Service: Orthopedics;  Laterality: Left;  BICEPS TENOTOMY   Family History  Problem Relation Age of Onset  . Diabetes Mother        adult onset - geriatric  . Alcohol abuse Father   . Cancer Father        prostate  . Stroke Father        mini strokes  . Heart Problems Father   . Other Maternal Aunt        born with her lung outside of her body; lived until 27.  . Arthritis Maternal Grandmother    Social History   Tobacco Use  . Smoking status: Never Smoker  . Smokeless tobacco: Never Used  Substance Use Topics  . Alcohol use: Yes    Alcohol/week: 0.0 standard drinks    Comment: a glass of wine/beer  . Drug use: No     [facility] Visit from 02/03/2018 [facility]  AUDIT-C Score  2      Interim medical history since last visit reviewed. Allergies and medications reviewed  Review of Systems Per HPI unless specifically indicated above     Objective:    BP 118/66   Pulse 83   Temp 98.7 F (37.1 C)   Ht 6\' 4"  (1.93 m)   Wt 273 lb 6.4 oz (124 kg)   SpO2 99%   BMI 33.28 kg/m   Wt Readings from Last 3 Encounters:  02/03/18 273 lb 6.4 oz (124 kg)  01/25/18 275 lb 9.6 oz (125 kg)  09/07/17 267 lb (121.1 kg)    Physical Exam  Constitutional: He appears well-developed and well-nourished. No distress.  Eyes: No scleral icterus.  Cardiovascular: Normal rate and regular rhythm.  Pulmonary/Chest: Effort normal and breath sounds normal.  Neurological: He is alert.  Skin: No pallor.  Psychiatric: He has a normal mood and affect.   Diabetic Foot Form - Detailed   Diabetic Foot Exam - detailed Diabetic Foot exam was performed with the following findings:  Yes 02/03/2018 10:34 AM  Visual Foot Exam completed.:  Yes  Pulse Foot Exam completed.:  Yes   Right Dorsalis Pedis:  Present Left Dorsalis Pedis:  Present  Sensory Foot Exam Completed.:  Yes Semmes-Weinstein Monofilament Test R Site 1-Great Toe:  Pos L Site 1-Great Toe:  Pos  Results for orders placed or performed in visit on 24/40/10  BASIC METABOLIC PANEL WITH GFR  Result Value Ref Range   Glucose, Bld 102 (H) 65 - 99 mg/dL   BUN 12 7 - 25 mg/dL   Creat 0.93 0.70 - 1.25 mg/dL   GFR, Est Non African American 88 > OR = 60 mL/min/1.55m2   GFR, Est African American 102 > OR = 60 mL/min/1.1m2   BUN/Creatinine Ratio NOT APPLICABLE 6 - 22 (calc)   Sodium 138 135 - 146 mmol/L   Potassium 3.9 3.5 - 5.3 mmol/L   Chloride 105 98 - 110 mmol/L   CO2 24 20 - 32 mmol/L   Calcium 9.6 8.6 - 10.3 mg/dL  CBC  Result Value Ref Range   WBC 4.7 3.8 - 10.8 Thousand/uL   RBC 4.91 4.20 - 5.80 Million/uL   Hemoglobin 13.9 13.2 - 17.1 g/dL   HCT 40.9 38.5 - 50.0 %   MCV 83.3 80.0 - 100.0 fL   MCH 28.3 27.0 - 33.0 pg   MCHC 34.0 32.0 - 36.0 g/dL   RDW 14.3 11.0 - 15.0 %   Platelets 261 140 - 400 Thousand/uL   MPV 10.6 7.5 - 12.5 fL  TSH  Result Value Ref Range   TSH 1.32 0.40 - 4.50 mIU/L  Lipid panel  Result Value Ref Range   Cholesterol 139 <200 mg/dL   HDL 46 >40 mg/dL   Triglycerides 156 (H) <150 mg/dL   LDL Cholesterol (Calc) 70 mg/dL (calc)   Total CHOL/HDL Ratio 3.0 <5.0 (calc)   Non-HDL Cholesterol (Calc) 93 <130 mg/dL (calc)  Hemoglobin A1C w/out eAG  Result Value Ref Range   Hgb A1c MFr Bld 6.3 (H) <5.7 % of total Hgb  VITAMIN D 25 Hydroxy (Vit-D Deficiency, Fractures)  Result Value Ref Range   Vit D, 25-Hydroxy 34 30 - 100 ng/mL  Testosterone  Result Value Ref Range   Testosterone 199 (L) 250 - 827 ng/dL      Assessment & Plan:   Problem List Items Addressed This Visit      Cardiovascular and Mediastinum   Hypertension goal BP (blood pressure) < 130/80    Continue ARB      CAD (coronary artery disease), native coronary artery - Primary    Patient  has had some palpitations, pounding heart beats; sending note to cardiologist to see if stress testing indicated; he will get back on crestor to get his LDL back down (he is just over goal right now); reviewed s/s of heart attack, call 911 if occurs      Relevant Orders   Ambulatory referral to Urology     Endocrine   Type 2 diabetes mellitus ([place])    Foot exam by MD; encouraged weight loss; next A1c in May        Other   Obesity    Encouraged weight loss; he will try to lose 15-20 pounds before his next visit in 6 months, conservative, less than 1 pound per week      Hyperlipidemia LDL goal <70    Patient will start back on his Crestor; continue Repatha; f/u with cardiologist       Other Visit Diagnoses    Hypogonadism in male       Relevant Orders   Ambulatory referral to Urology       Follow up plan: Return in about 6 months (around 07/27/2018) for follow-up visit with Dr. Sanda Klein.  An after-visit summary was printed and given  to the patient at [place].  Please see the patient instructions which may contain other information and recommendations beyond what is mentioned above in the assessment and plan.  Meds ordered this encounter  Medications  . famotidine (PEPCID) 20 MG tablet    Sig: Take 1 tablet (20 mg total) by mouth daily as needed for heartburn or indigestion.    Dispense:  30 tablet    Refill:  5    Stop ranitidine    Orders Placed This Encounter  Procedures  . Ambulatory referral to Urology

## 2018-02-03 NOTE — Assessment & Plan Note (Signed)
--  Continue ARB 

## 2018-02-03 NOTE — Assessment & Plan Note (Signed)
Patient has had some palpitations, pounding heart beats; sending note to cardiologist to see if stress testing indicated; he will get back on crestor to get his LDL back down (he is just over goal right now); reviewed s/s of heart attack, call 911 if occurs

## 2018-02-03 NOTE — Assessment & Plan Note (Signed)
Foot exam by MD; encouraged weight loss; next A1c in May

## 2018-02-03 NOTE — Assessment & Plan Note (Signed)
Encouraged weight loss; he will try to lose 15-20 pounds before his next visit in 6 months, conservative, less than 1 pound per week

## 2018-02-03 NOTE — Patient Instructions (Addendum)
Start back on the 5 mg of Crestor (rosuvastatin) daily We'll have you see the urologist about your low testosterone Avoid caffeine as much as possible Call 911 for any chest pressure Try to limit saturated fats in your diet (bologna, hot dogs, barbeque, cheeseburgers, hamburgers, steak, bacon, sausage, cheese, etc.) and get more fresh fruits, vegetables, and whole grains Try to lose 15-20 pounds over the next 6 months Check out the information at familydoctor.org entitled "Nutrition for Weight Loss: What You Need to Know about Fad Diets" Try to lose between 1-2 pounds per week by taking in fewer calories and burning off more calories You can succeed by limiting portions, limiting foods dense in calories and fat, becoming more active, and drinking 8 glasses of water a day (64 ounces) Don't skip meals, especially breakfast, as skipping meals may alter your metabolism Do not use over-the-counter weight loss pills or gimmicks that claim rapid weight loss A healthy BMI (or body mass index) is between 18.5 and 24.9 You can calculate your ideal BMI at the [place] website ClubMonetize.fr  Obesity, Adult Obesity is the condition of having too much total body fat. Being overweight or obese means that your weight is greater than what is considered healthy for your body size. Obesity is determined by a measurement called BMI. BMI is an estimate of body fat and is calculated from height and weight. For adults, a BMI of 30 or higher is considered obese. Obesity can eventually lead to other health concerns and major illnesses, including:  Stroke.  Coronary artery disease (CAD).  Type 2 diabetes.  Some types of cancer, including cancers of the colon, breast, uterus, and gallbladder.  Osteoarthritis.  High blood pressure (hypertension).  High cholesterol.  Sleep apnea.  Gallbladder stones.  Infertility problems.  What are the causes? The main  cause of obesity is taking in (consuming) more calories than your body uses for energy. Other factors that contribute to this condition may include:  Being born with genes that make you more likely to become obese.  Having a medical condition that causes obesity. These conditions include: ? Hypothyroidism. ? Polycystic ovarian syndrome (PCOS). ? Binge-eating disorder. ? Cushing syndrome.  Taking certain medicines, such as steroids, antidepressants, and seizure medicines.  Not being physically active (sedentary lifestyle).  Living where there are limited places to exercise safely or buy healthy foods.  Not getting enough sleep.  What increases the risk? The following factors may increase your risk of this condition:  Having a family history of obesity.  Being a woman of African-American descent.  Being a man of Hispanic descent.  What are the signs or symptoms? Having excessive body fat is the main symptom of this condition. How is this diagnosed? This condition may be diagnosed based on:  Your symptoms.  Your medical history.  A physical exam. Your health care provider may measure: ? Your BMI. If you are an adult with a BMI between 25 and less than 30, you are considered overweight. If you are an adult with a BMI of 30 or higher, you are considered obese. ? The distances around your hips and your waist (circumferences). These may be compared to each other to help diagnose your condition. ? Your skinfold thickness. Your health care provider may gently pinch a fold of your skin and measure it.  How is this treated? Treatment for this condition often includes changing your lifestyle. Treatment may include some or all of the following:  Dietary changes. Work with your health  care provider and a dietitian to set a weight-loss goal that is healthy and reasonable for you. Dietary changes may include eating: ? Smaller portions. A portion size is the amount of a particular food  that is healthy for you to eat at one time. This varies from person to person. ? Low-calorie or low-fat options. ? More whole grains, fruits, and vegetables.  Regular physical activity. This may include aerobic activity (cardio) and strength training.  Medicine to help you lose weight. Your health care provider may prescribe medicine if you are unable to lose 1 pound a week after 6 weeks of eating more healthily and doing more physical activity.  Surgery. Surgical options may include gastric banding and gastric bypass. Surgery may be done if: ? Other treatments have not helped to improve your condition. ? You have a BMI of 40 or higher. ? You have life-threatening health problems related to obesity.  Follow these instructions at home:  Eating and drinking   Follow recommendations from your health care provider about what you eat and drink. Your health care provider may advise you to: ? Limit fast foods, sweets, and processed snack foods. ? Choose low-fat options, such as low-fat milk instead of whole milk. ? Eat 5 or more servings of fruits or vegetables every day. ? Eat at home more often. This gives you more control over what you eat. ? Choose healthy foods when you eat out. ? Learn what a healthy portion size is. ? Keep low-fat snacks on hand. ? Avoid sugary drinks, such as soda, fruit juice, iced tea sweetened with sugar, and flavored milk. ? Eat a healthy breakfast.  Drink enough water to keep your urine clear or pale yellow.  Do not go without eating for long periods of time (do not fast) or follow a fad diet. Fasting and fad diets can be unhealthy and even dangerous. Physical Activity  Exercise regularly, as told by your health care provider. Ask your health care provider what types of exercise are safe for you and how often you should exercise.  Warm up and stretch before being active.  Cool down and stretch after being active.  Rest between periods of  activity. Lifestyle  Limit the time that you spend in front of your TV, computer, or video game system.  Find ways to reward yourself that do not involve food.  Limit alcohol intake to no more than 1 drink a day for nonpregnant women and 2 drinks a day for men. One drink equals 12 oz of beer, 5 oz of wine, or 1 oz of hard liquor. General instructions  Keep a weight loss journal to keep track of the food you eat and how much you exercise you get.  Take over-the-counter and prescription medicines only as told by your health care provider.  Take vitamins and supplements only as told by your health care provider.  Consider joining a support group. Your health care provider may be able to recommend a support group.  Keep all follow-up visits as told by your health care provider. This is important. Contact a health care provider if:  You are unable to meet your weight loss goal after 6 weeks of dietary and lifestyle changes. This information is not intended to replace advice given to you by your health care provider. Make sure you discuss any questions you have with your health care provider. Document Released: 04/17/2004 Document Revised: 08/13/2015 Document Reviewed: 12/27/2014 Elsevier Interactive Patient Education  2018 [place]  Unhealthy Weight Gain, Adult Staying at a healthy weight is important. When fat builds up in your body, you may become overweight or obese. These conditions put you at greater risk for developing certain health problems, such as heart disease, diabetes, sleeping problems, joint problems, and some cancers. Unhealthy weight gain is often the result of making unhealthy choices in what you eat. It is also a result of not getting enough exercise. You can make changes to your lifestyle to prevent obesity and stay as healthy as possible. What nutrition changes can be made? To maintain a healthy weight and prevent obesity:  Eat only as much as your  body needs. To do this: ? Pay attention to signs that you are hungry or full. Stop eating as soon as you feel full. ? If you feel hungry, try drinking water first. Drink enough water so your urine is clear or pale yellow. ? Eat smaller portions. ? Look at serving sizes on food labels. Most foods contain more than one serving per container. ? Eat the recommended amount of calories for your gender and activity level. While most active people should eat around 2,000 calories per day, if you are trying to lose weight or are not very active, you main need to eat less calories. Talk to your health care provider or dietitian about how many calories you should eat each day.  Choose healthy foods, such as: ? Fruits and vegetables. Try to fill at least half of your plate at each meal with fruits and vegetables. ? Whole grains, such as whole wheat bread, brown rice, and quinoa. ? Lean meats, such as chicken or fish. ? Other healthy proteins, such as beans, eggs, or tofu. ? Healthy fats, such as nuts, seeds, fatty fish, and olive oil. ? Low-fat or fat-free dairy.  Check food labels and avoid food and drinks that: ? Are high in calories. ? Have added sugar. ? Are high in sodium. ? Have saturated fats or trans fats.  Limit how much you eat of the following foods: ? Prepackaged meals. ? Fast food. ? Fried foods. ? Processed meat, such as bacon, sausage, and deli meats. ? Fatty cuts of red meat and poultry with skin.  Cook foods in healthier ways, such as by baking, broiling, or grilling.  When grocery shopping, try to shop around the outside of the store. This helps you buy mostly fresh foods and avoid canned and prepackaged foods.  What lifestyle changes can be made?  Exercise at least 30 minutes 5 or more days each week. Exercising includes brisk walking, yard work, biking, running, swimming, and team sports like basketball and soccer. Ask your health care provider which exercises are safe for  you.  Do not use any products that contain nicotine or tobacco, such as cigarettes and e-cigarettes. If you need help quitting, ask your health care provider.  Limit alcohol intake to no more than 1 drink a day for nonpregnant women and 2 drinks a day for men. One drink equals 12 oz of beer, 5 oz of wine, or 1 oz of hard liquor.  Try to get 7-9 hours of sleep each night. What other changes can be made?  Keep a food and activity journal to keep track of: ? What you ate and how many calories you had. Remember to count sauces, dressings, and side dishes. ? Whether you were active, and what exercises you did. ? Your calorie, weight, and activity goals.  Check your weight regularly. Track any changes.  If you notice you have gained weight, make changes to your diet or activity routine.  Avoid taking weight-loss medicines or supplements. Talk to your health care provider before starting any new medicine or supplement.  Talk to your health care provider before trying any new diet or exercise plan. Why are these changes important? Eating healthy, staying active, and having healthy habits not only help prevent obesity, they also:  Help you to manage stress and emotions.  Help you to connect with friends and family.  Improve your self-esteem.  Improve your sleep.  Prevent long-term health problems.  What can happen if changes are not made? Being obese or overweight can cause you to develop joint or bone problems, which can make it hard for you to stay active or do activities you enjoy. Being obese or overweight also puts stress on your heart and lungs and can lead to health problems like diabetes, heart disease, and some cancers. Where to find more information: Talk with your health care provider or a dietitian about healthy eating and healthy lifestyle choices. You may also find other information through these resources:  U.S. Department of Agriculture MyPlate:  FormerBoss.no  American Heart Association: www.heart.org  Centers for Disease Control and Prevention: http://www.wolf.info/  Summary  Staying at a healthy weight is important. It helps prevent certain diseases and health problems, such as heart disease, diabetes, joint problems, sleep disorders, and some cancers.  Being obese or overweight can cause you to develop joint or bone problems, which can make it hard for you to stay active or do activities you enjoy.  You can prevent unhealthy weight gain by eating a healthy diet, exercising regularly, not smoking, limiting alcohol, and getting enough sleep.  Talk with your health care provider or a dietitian for guidance about healthy eating and healthy lifestyle choices. This information is not intended to replace advice given to you by your health care provider. Make sure you discuss any questions you have with your health care provider. Document Released: 03/11/2016 Document Revised: 04/16/2016 Document Reviewed: 04/16/2016 Elsevier Interactive Patient Education  [employer].

## 2018-02-08 ENCOUNTER — Telehealth: Payer: Self-pay | Admitting: *Deleted

## 2018-02-08 NOTE — Telephone Encounter (Signed)
Spoke with patient and he reports that about 3 weeks ago he had some chest discomfort with deep breath in his left shoulder area. He reports that the discomfort was over the area of his rotator cuff surgery. He states that he is not having any other discomfort and that since then these symptoms have subsided. Reviewed provider recommendations and he would like to wait at this time since he has not had any recurrence. Instructed him to please give [place] a call back if he should have any further questions or concerns. He verbalized understanding of our conversation, agreement with plan, and no further questions at this time.

## 2018-02-08 NOTE — Telephone Encounter (Signed)
-----   Message from Minna Merritts, MD sent at 02/07/2018  9:24 PM EST -----  Can we call to check in on patient If he having palpitations, beats Suspect he may be having ectopy Monitor can be ordered if he would like Would start metoprolol succinate 25 mg daily at dinner If he has concerned for worsening coronary disease, stress test could be ordered thx TG  ----- Message ----- From: Arnetha Courser, MD Sent: 02/03/2018  10:04 AM EST To: Minna Merritts, MD  Patient seen recently (last week and today) for palpitations, forceful beats, left shoulder pain; just want you to be aware in case you want to see him; due for stress test??

## 2018-02-09 ENCOUNTER — Telehealth: Payer: Self-pay | Admitting: Family Medicine

## 2018-02-09 NOTE — Telephone Encounter (Signed)
Patient called back stating he has already went to [facility] for draw, disregard inquiry.

## 2018-02-09 NOTE — Telephone Encounter (Signed)
Pt came by the office and stated he was referred to Urologist. Urologist gave him a order for [facility] to draw PSA and Testosterone. Pt went by [facility] and was advised he would have to pay 45.00 dollars to get these draw. Pt wants to know if Dr Sanda Klein can order these for him so he can get these labs done here at our office. Pt was informed that the only labs that are normally drawn here are ordered by our physicians. Pt wanted me to ask to see if this was possible. Please advise. [phone] can be reached at this number. It is ok to leave a message if pt is unavailable to answer.

## 2018-02-16 ENCOUNTER — Ambulatory Visit: Payer: Self-pay

## 2018-02-16 NOTE — Telephone Encounter (Signed)
Outgoing call to Patient. Patient complains of abdominal pain.  States that he went to a restaurant on Saturday evening around 6pm.  At 100:00pm Pain in the abdomin and sides developed.  Patient describes the pain as muscular aches.  Sometimes back area. States that it was intermittent. When he would take a` breath. Pain would be Severe in the beginning, now it is rated 7.  Patient thinks he had food poison.  States he feels better when he lays down. In the beginning he wasn't abe to void.  Now he is able to void.  Patient states that he can feel and hear his stomach growling.  Patient states that he ate some chinese  Food. Irritated his stomach again.  Patient chose to continue with home care. Provided care advice.  Voiced understanding.   Relate to Patient if Sx worsen to call back . Patient voiced understanding.                                  Reason for Disposition . [1] MILD-MODERATE pain AND [2] constant and [3] present < 2 hours  Answer Assessment - Initial Assessment Questions 1. LOCATION: "Where does it hurt?"      abd  And sides 2. RADIATION: "Does the pain shoot anywhere else?" (e.g., chest, back)     Side and back 3. ONSET: "When did the pain begin?" (Minutes, hours or days ago)      Saturday 4. SUDDEN: "Gradual or sudden onset?"     sudden 5. PATTERN "Does the pain come and go, or is it constant?"    - If constant: "Is it getting better, staying the same, or worsening?"      (Note: Constant means the pain never goes away completely; most serious pain is constant and it progresses)     - If intermittent: "How long does it last?" "Do you have pain now?"     (Note: Intermittent means the pain goes away completely between bouts)      intermitten when I tak a deep breath 6. SEVERITY: "How bad is the pain?"  (e.g., Scale 1-10; mild, moderate, or severe)    - MILD (1-3): doesn't interfere with normal activities, abdomen soft and not tender to touch     - MODERATE (4-7): interferes with  normal activities or awakens from sleep, tender to touch     - SEVERE (8-10): excruciating pain, doubled over, unable to do any normal activities       Severe in beginning now 7 not constant 7. RECURRENT SYMPTOM: "Have you ever had this type of abdominal pain before?" If so, ask: "When was the last time?" and "What happened that time?"      no 8. CAUSE: "What do you think is causing the abdominal pain?"      Food poison 9. RELIEVING/AGGRAVATING FACTORS: "What makes it better or worse?" (e.g., movement, antacids, bowel movement)      Feels better when I lay down 10. OTHER SYMPTOMS: "Has there been any vomiting, diarrhea, constipation, or urine problems?"       Constipation, initiationially couldn't void now void .  Protocols used: ABDOMINAL PAIN - MALE-A-AH

## 2018-02-23 ENCOUNTER — Other Ambulatory Visit (HOSPITAL_COMMUNITY): Payer: Self-pay | Admitting: Urology

## 2018-02-23 DIAGNOSIS — R972 Elevated prostate specific antigen [PSA]: Secondary | ICD-10-CM

## 2018-02-24 ENCOUNTER — Other Ambulatory Visit: Payer: Self-pay | Admitting: Cardiovascular Disease

## 2018-02-24 MED ORDER — EVOLOCUMAB 140 MG/ML ~~LOC~~ SOAJ: 1.0000 "pen " | SUBCUTANEOUS | 6 refills | Status: DC

## 2018-02-24 NOTE — Telephone Encounter (Signed)
°*  STAT* If patient is at the pharmacy, call can be transferred to refill team.   1. Which medications need to be refilled? (please list name of each medication and dose if known)  Repatha Sureclick 802 MG  2. Which pharmacy/location (including street and city if local pharmacy) is medication to be sent to? [facility] on [address]  3. Do they need a 30 day or 90 day supply? 30 day   Patient states he has 2 shots left

## 2018-02-26 ENCOUNTER — Telehealth: Payer: Self-pay

## 2018-02-26 NOTE — Telephone Encounter (Signed)
Prior Auth started through [facility] ([facility]) Rx #: [id number] Repatha SureClick 140MG /ML auto-injectors  [facility] typically responds with questions in less than 15 minutes, but may take up to 24 hours.

## 2018-03-03 ENCOUNTER — Ambulatory Visit (HOSPITAL_COMMUNITY): Payer: BC Managed Care – PPO

## 2018-03-03 HISTORY — PX: PROSTATE BIOPSY: SHX241

## 2018-03-05 ENCOUNTER — Ambulatory Visit: Payer: BC Managed Care – PPO | Admitting: Family Medicine

## 2018-03-05 ENCOUNTER — Encounter: Payer: Self-pay | Admitting: Family Medicine

## 2018-03-05 DIAGNOSIS — Z6832 Body mass index (BMI) 32.0-32.9, adult: Secondary | ICD-10-CM

## 2018-03-05 DIAGNOSIS — E6609 Other obesity due to excess calories: Secondary | ICD-10-CM

## 2018-03-05 DIAGNOSIS — E669 Obesity, unspecified: Secondary | ICD-10-CM

## 2018-03-05 DIAGNOSIS — I10 Essential (primary) hypertension: Secondary | ICD-10-CM | POA: Diagnosis not present

## 2018-03-05 DIAGNOSIS — E1169 Type 2 diabetes mellitus with other specified complication: Secondary | ICD-10-CM | POA: Diagnosis not present

## 2018-03-05 DIAGNOSIS — I2510 Atherosclerotic heart disease of native coronary artery without angina pectoris: Secondary | ICD-10-CM | POA: Diagnosis not present

## 2018-03-05 NOTE — Assessment & Plan Note (Signed)
Suggested vegan plant-based diet; continue statin plus repatha for now; working on weight loss, limiting saturated fats

## 2018-03-05 NOTE — Progress Notes (Signed)
BP 138/72   Pulse 91   Temp 98 F (36.7 C) (Oral)   Ht 6\' 4"  (1.93 m)   Wt 269 lb (122 kg)   SpO2 99%   BMI 32.74 kg/m    Subjective:    Patient ID: Dean Sullivan, male    DOB: [birth year], 62 y.o.   MRN: [id number]  HPI: Dean Sullivan is a 62 y.o. male  Chief Complaint  Patient presents with  . Follow-up    HPI Patient is here for follow-up  Type 2 diabetes; no new problems with feet; callus there; UTD with eye exam; not checking FSBS with my blessing; trying to lose weight and eat better; he has lost weight; cutting back on [place] food; brown rice, brown bread  Lab Results  Component Value Date   HGBA1C 6.3 (H) 01/25/2018   High cholesterol; trying to limit certain foods and working on weight loss; doing wheat and brown; egg beaters; on repatha and crestor; no muscle aches on the 5 mg of crestor; last LDL was 70 but thinks that was him eating poorly (greasy food and [place] food); last lipids on 01/25/18  High blood pressure Initially elevated and improved with some rest; not checking right now but can, has monitor; cardiologist has him on 50 mg of losartan (only medicine); not adding any salt to his food; canned veggies but rinses the veggies off with water before he eats them; mostly using frozen veggies  Patient is on levaquin; had prostate nodule biopsies; waiting for biopsy results which may take two weeks around the holidays; did have a little blood the first part of the first day; he stopped the aspirin for the procedure; back on today; no difficulty urinating; he is still urinating a lot  Depression screen [facility] 2/9 03/05/2018 02/03/2018 07/13/2017 03/31/2017 02/19/2017  Decreased Interest 0 0 0 0 0  Down, Depressed, Hopeless 0 0 0 0 0  PHQ - 2 Score 0 0 0 0 0  Altered sleeping 0 0 - - -  Tired, decreased energy 0 0 - - -  Change in appetite 0 0 - - -  Feeling bad or failure about yourself  0 0 - - -  Trouble concentrating 0 0 - - -  Moving slowly or  fidgety/restless 0 0 - - -  Suicidal thoughts 0 0 - - -  PHQ-9 Score 0 0 - - -  Difficult doing work/chores Not difficult at all Not difficult at all - - -   Fall Risk  03/05/2018 02/03/2018 07/13/2017 03/31/2017 02/19/2017  Falls in the past year? 0 0 No No No    Relevant past medical, surgical, family and social history reviewed Past Medical History:  Diagnosis Date  . Allergy   . Blockage of coronary artery bypass graft    patient stated that he had blockages - stents were inserted.  . Coronary arteriosclerosis in native artery   . Diabetes mellitus without complication ([place])   . GERD (gastroesophageal reflux disease)    occ  . Glaucoma   . Hearing loss of right ear    patient was told he had slight hearing loss  . History of kidney stones    h/o  . History of trigger finger    bilateral, but mostly in right thumb  . Hyperlipidemia   . Hypertension   . Hypertension goal BP (blood pressure) < 130/80 01/05/2015  . Irregular heart beat   . Sleep apnea    cpap  . Type 2  diabetes mellitus ([place]) 03/18/2017   A1c 6.28 Jan 2017   Past Surgical History:  Procedure Laterality Date  . CARDIAC CATHETERIZATION     x2 stents [place]  2012   2  . COLONOSCOPY WITH PROPOFOL N/A 04/21/2017   Procedure: COLONOSCOPY WITH PROPOFOL;  Surgeon: Jonathon Bellows, MD;  Location: [facility] ENDOSCOPY;  Service: Gastroenterology;  Laterality: N/A;  . LIPOMA EXCISION Left 03/09/2017   Procedure: EXCISION LIPOMA;  Surgeon: Earnestine Leys, MD;  Location: [facility] ORS;  Service: Orthopedics;  Laterality: Left;  . PROSTATE BIOPSY  03/03/2018  . RESECTION DISTAL CLAVICAL Left 03/09/2017   Procedure: RESECTION DISTAL CLAVICAL;  Surgeon: Earnestine Leys, MD;  Location: [facility] ORS;  Service: Orthopedics;  Laterality: Left;  . SHOULDER ARTHROSCOPY WITH OPEN ROTATOR CUFF REPAIR Left 03/09/2017   Procedure: SHOULDER ARTHROSCOPY WITH OPEN ROTATOR CUFF REPAIR;  Surgeon: Earnestine Leys, MD;   Location: [facility] ORS;  Service: Orthopedics;  Laterality: Left;  . SUBACROMIAL DECOMPRESSION Left 03/09/2017   Procedure: SUBACROMIAL DECOMPRESSION;  Surgeon: Earnestine Leys, MD;  Location: [facility] ORS;  Service: Orthopedics;  Laterality: Left;  BICEPS TENOTOMY   Family History  Problem Relation Age of Onset  . Diabetes Mother        adult onset - geriatric  . Alcohol abuse Father   . Cancer Father        prostate  . Stroke Father        mini strokes  . Heart Problems Father   . Other Maternal Aunt        born with her lung outside of her body; lived until 49.  . Arthritis Maternal Grandmother    Social History   Tobacco Use  . Smoking status: Never Smoker  . Smokeless tobacco: Never Used  Substance Use Topics  . Alcohol use: Yes    Alcohol/week: 0.0 standard drinks    Comment: a glass of wine/beer  . Drug use: No     Office Visit from 03/05/2018 in [facility]  AUDIT-C Score  2      Interim medical history since last visit reviewed. Allergies and medications reviewed  Review of Systems  Constitutional: Negative for unexpected weight change.  Respiratory: Negative for cough and shortness of breath.   Cardiovascular: Negative for chest pain.  Musculoskeletal:       Little "tingle" in the upper left side chest; only happens when he drinks a beer at night; no pain; no heartburn; it doesn't hurt, just notices it; cardiologist said MSK; fraction of a second   Per HPI unless specifically indicated above     Objective:    BP 138/72   Pulse 91   Temp 98 F (36.7 C) (Oral)   Ht 6\' 4"  (1.93 m)   Wt 269 lb (122 kg)   SpO2 99%   BMI 32.74 kg/m   Wt Readings from Last 3 Encounters:  03/05/18 269 lb (122 kg)  02/03/18 273 lb 6.4 oz (124 kg)  01/25/18 275 lb 9.6 oz (125 kg)    Physical Exam Constitutional:      General: He is not in acute distress.    Appearance: He is well-developed. He is obese.     Comments: Down 6.5 pounds in five weeks  HENT:       Head: Normocephalic and atraumatic.  Eyes:     General: No scleral icterus. Neck:     Thyroid: No thyromegaly.  Cardiovascular:     Rate and Rhythm: Normal  rate and regular rhythm.  Pulmonary:     Effort: Pulmonary effort is normal.     Breath sounds: Normal breath sounds.  Abdominal:     General: Bowel sounds are normal. There is no distension.     Palpations: Abdomen is soft.  Skin:    General: Skin is warm and dry.     Coloration: Skin is not pale.  Neurological:     Coordination: Coordination normal.  Psychiatric:        Behavior: Behavior normal.        Thought Content: Thought content normal.        Judgment: Judgment normal.    Diabetic Foot Form - Detailed   Diabetic Foot Exam - detailed Diabetic Foot exam was performed with the following findings:  Yes 03/05/2018  4:32 PM  Visual Foot Exam completed.:  Yes  Pulse Foot Exam completed.:  Yes  Right Dorsalis Pedis:  Present Left Dorsalis Pedis:  Present  Sensory Foot Exam Completed.:  Yes Semmes-Weinstein Monofilament Test R Site 1-Great Toe:  Pos L Site 1-Great Toe:  Pos         Results for orders placed or performed in visit on 62/95/28  BASIC METABOLIC PANEL WITH GFR  Result Value Ref Range   Glucose, Bld 102 (H) 65 - 99 mg/dL   BUN 12 7 - 25 mg/dL   Creat 0.93 0.70 - 1.25 mg/dL   GFR, Est Non African American 88 > OR = 60 mL/min/1.33m2   GFR, Est African American 102 > OR = 60 mL/min/1.9m2   BUN/Creatinine Ratio NOT APPLICABLE 6 - 22 (calc)   Sodium 138 135 - 146 mmol/L   Potassium 3.9 3.5 - 5.3 mmol/L   Chloride 105 98 - 110 mmol/L   CO2 24 20 - 32 mmol/L   Calcium 9.6 8.6 - 10.3 mg/dL  CBC  Result Value Ref Range   WBC 4.7 3.8 - 10.8 Thousand/uL   RBC 4.91 4.20 - 5.80 Million/uL   Hemoglobin 13.9 13.2 - 17.1 g/dL   HCT 40.9 38.5 - 50.0 %   MCV 83.3 80.0 - 100.0 fL   MCH 28.3 27.0 - 33.0 pg   MCHC 34.0 32.0 - 36.0 g/dL   RDW 14.3 11.0 - 15.0 %   Platelets 261 140 - 400 Thousand/uL   MPV  10.6 7.5 - 12.5 fL  TSH  Result Value Ref Range   TSH 1.32 0.40 - 4.50 mIU/L  Lipid panel  Result Value Ref Range   Cholesterol 139 <200 mg/dL   HDL 46 >40 mg/dL   Triglycerides 156 (H) <150 mg/dL   LDL Cholesterol (Calc) 70 mg/dL (calc)   Total CHOL/HDL Ratio 3.0 <5.0 (calc)   Non-HDL Cholesterol (Calc) 93 <130 mg/dL (calc)  Hemoglobin A1C w/out eAG  Result Value Ref Range   Hgb A1c MFr Bld 6.3 (H) <5.7 % of total Hgb  VITAMIN D 25 Hydroxy (Vit-D Deficiency, Fractures)  Result Value Ref Range   Vit D, 25-Hydroxy 34 30 - 100 ng/mL  Testosterone  Result Value Ref Range   Testosterone 199 (L) 250 - 827 ng/dL      Assessment & Plan:   Problem List Items Addressed This Visit      Cardiovascular and Mediastinum   Hypertension goal BP (blood pressure) < 130/80    I notified cardiologist of readings being elevated today; continue ARB at 50 mg daily (at bedtime) until hearing back from cardiologist; may increase to 100 mg or add another agent  per his discretion; weight loss and DASH guidelines recommended; if he can get BMI under 30, would expect we can decrease medicine; encouraged vegan diet      CAD (coronary artery disease), native coronary artery    Suggested vegan plant-based diet; continue statin plus repatha for now; working on weight loss, limiting saturated fats        Endocrine   Diabetes mellitus type 2 in obese (HCC)    Better A1c last month; patient is actively working on weight loss and better eating; foot exam by MD today; eye exam UTD; goal syst BP < 130        Other   Obesity    Encouraged weight loss; acknowledged the 6.5+ pounds he has lost in the last 5 weeks; keep it up to get BMI under 30 first goal          Follow up plan: Return in about 5 months (around 07/27/2018) for twenty minute follow-up with fasting labs (or just AFTER).  An after-visit summary was printed and given to the patient at [place].  Please see the patient instructions which may  contain other information and recommendations beyond what is mentioned above in the assessment and plan.  No orders of the defined types were placed in this encounter.   No orders of the defined types were placed in this encounter.

## 2018-03-05 NOTE — Assessment & Plan Note (Signed)
I notified cardiologist of readings being elevated today; continue ARB at 50 mg daily (at bedtime) until hearing back from cardiologist; may increase to 100 mg or add another agent per his discretion; weight loss and DASH guidelines recommended; if he can get BMI under 30, would expect we can decrease medicine; encouraged vegan diet

## 2018-03-05 NOTE — Patient Instructions (Addendum)
Try to follow the DASH guidelines (DASH stands for Dietary Approaches to Stop Hypertension). Try to limit the sodium in your diet to no more than 1,500mg  of sodium per day. Certainly try to not exceed 2,000 mg per day at the very most. Do not add salt when cooking or at the table.  Check the sodium amount on labels when shopping, and choose items lower in sodium when given a choice. Avoid or limit foods that already contain a lot of sodium. Eat a diet rich in fruits and vegetables and whole grains, and try to lose weight if overweight or obese  Check your blood pressure a few times daily and contact me or Dr. Rockey Situ with your readings in 5-7 days Goal top number is less than 130 mmHg   DASH Eating Plan DASH stands for "Dietary Approaches to Stop Hypertension." The DASH eating plan is a healthy eating plan that has been shown to reduce high blood pressure (hypertension). It may also reduce your risk for type 2 diabetes, heart disease, and stroke. The DASH eating plan may also help with weight loss. What are tips for following this plan? General guidelines  Avoid eating more than 2,300 mg (milligrams) of salt (sodium) a day. If you have hypertension, you may need to reduce your sodium intake to 1,500 mg a day.  Limit alcohol intake to no more than 1 drink a day for nonpregnant women and 2 drinks a day for men. One drink equals 12 oz of beer, 5 oz of wine, or 1 oz of hard liquor.  Work with your health care provider to maintain a healthy body weight or to lose weight. Ask what an ideal weight is for you.  Get at least 30 minutes of exercise that causes your heart to beat faster (aerobic exercise) most days of the week. Activities may include walking, swimming, or biking.  Work with your health care provider or diet and nutrition specialist (dietitian) to adjust your eating plan to your individual calorie needs. Reading food labels  Check food labels for the amount of sodium per serving. Choose  foods with less than 5 percent of the Daily Value of sodium. Generally, foods with less than 300 mg of sodium per serving fit into this eating plan.  To find whole grains, look for the word "whole" as the first word in the ingredient list. Shopping  Buy products labeled as "low-sodium" or "no salt added."  Buy fresh foods. Avoid canned foods and premade or frozen meals. Cooking  Avoid adding salt when cooking. Use salt-free seasonings or herbs instead of table salt or sea salt. Check with your health care provider or pharmacist before using salt substitutes.  Do not fry foods. Cook foods using healthy methods such as baking, boiling, grilling, and broiling instead.  Cook with heart-healthy oils, such as olive, canola, soybean, or sunflower oil. Meal planning   Eat a balanced diet that includes: ? 5 or more servings of fruits and vegetables each day. At each meal, try to fill half of your plate with fruits and vegetables. ? Up to 6-8 servings of whole grains each day. ? Less than 6 oz of lean meat, poultry, or fish each day. A 3-oz serving of meat is about the same size as a deck of cards. One egg equals 1 oz. ? 2 servings of low-fat dairy each day. ? A serving of nuts, seeds, or beans 5 times each week. ? Heart-healthy fats. Healthy fats called Omega-3 fatty acids are found  in foods such as flaxseeds and coldwater fish, like sardines, salmon, and mackerel.  Limit how much you eat of the following: ? Canned or prepackaged foods. ? Food that is high in trans fat, such as fried foods. ? Food that is high in saturated fat, such as fatty meat. ? Sweets, desserts, sugary drinks, and other foods with added sugar. ? Full-fat dairy products.  Do not salt foods before eating.  Try to eat at least 2 vegetarian meals each week.  Eat more home-cooked food and less restaurant, buffet, and fast food.  When eating at a restaurant, ask that your food be prepared with less salt or no salt, if  possible. What foods are recommended? The items listed may not be a complete list. Talk with your dietitian about what dietary choices are best for you. Grains Whole-grain or whole-wheat bread. Whole-grain or whole-wheat pasta. Brown rice. [place]. Bulgur. Whole-grain and low-sodium cereals. Pita bread. Low-fat, low-sodium crackers. Whole-wheat flour tortillas. Vegetables Fresh or frozen vegetables (raw, steamed, roasted, or grilled). Low-sodium or reduced-sodium tomato and vegetable juice. Low-sodium or reduced-sodium tomato sauce and tomato paste. Low-sodium or reduced-sodium canned vegetables. Fruits All fresh, dried, or frozen fruit. Canned fruit in natural juice (without added sugar). Meat and other protein foods Skinless chicken or [place]. Ground chicken or [place]. Pork with fat trimmed off. Fish and seafood. Egg whites. Dried beans, peas, or lentils. Unsalted nuts, nut butters, and seeds. Unsalted canned beans. Lean cuts of beef with fat trimmed off. Low-sodium, lean deli meat. Dairy Low-fat (1%) or fat-free (skim) milk. Fat-free, low-fat, or reduced-fat cheeses. Nonfat, low-sodium ricotta or cottage cheese. Low-fat or nonfat yogurt. Low-fat, low-sodium cheese. Fats and oils Soft margarine without trans fats. Vegetable oil. Low-fat, reduced-fat, or light mayonnaise and salad dressings (reduced-sodium). Canola, safflower, olive, soybean, and sunflower oils. Avocado. Seasoning and other foods Herbs. Spices. Seasoning mixes without salt. Unsalted popcorn and pretzels. Fat-free sweets. What foods are not recommended? The items listed may not be a complete list. Talk with your dietitian about what dietary choices are best for you. Grains Baked goods made with fat, such as croissants, muffins, or some breads. Dry pasta or rice meal packs. Vegetables Creamed or fried vegetables. Vegetables in a cheese sauce. Regular canned vegetables (not low-sodium or reduced-sodium). Regular canned  tomato sauce and paste (not low-sodium or reduced-sodium). Regular tomato and vegetable juice (not low-sodium or reduced-sodium). Angie Fava. Olives. Fruits Canned fruit in a light or heavy syrup. Fried fruit. Fruit in cream or butter sauce. Meat and other protein foods Fatty cuts of meat. Ribs. Fried meat. Berniece Salines. Sausage. Bologna and other processed lunch meats. Salami. Fatback. Hotdogs. Bratwurst. Salted nuts and seeds. Canned beans with added salt. Canned or smoked fish. Whole eggs or egg yolks. Chicken or [place] with skin. Dairy Whole or 2% milk, cream, and half-and-half. Whole or full-fat cream cheese. Whole-fat or sweetened yogurt. Full-fat cheese. Nondairy creamers. Whipped toppings. Processed cheese and cheese spreads. Fats and oils Butter. Stick margarine. Lard. Shortening. Ghee. Bacon fat. Tropical oils, such as coconut, palm kernel, or palm oil. Seasoning and other foods Salted popcorn and pretzels. Onion salt, garlic salt, seasoned salt, table salt, and sea salt. Worcestershire sauce. Tartar sauce. Barbecue sauce. Teriyaki sauce. Soy sauce, including reduced-sodium. Steak sauce. Canned and packaged gravies. Fish sauce. Oyster sauce. Cocktail sauce. Horseradish that you find on the shelf. Ketchup. Mustard. Meat flavorings and tenderizers. Bouillon cubes. Hot sauce and Tabasco sauce. Premade or packaged marinades. Premade or packaged taco seasonings. Relishes.  Regular salad dressings. Where to find more information:  National Heart, Lung, and [place]: https://wilson-eaton.com/  American Heart Association: www.heart.org Summary  The DASH eating plan is a healthy eating plan that has been shown to reduce high blood pressure (hypertension). It may also reduce your risk for type 2 diabetes, heart disease, and stroke.  With the DASH eating plan, you should limit salt (sodium) intake to 2,300 mg a day. If you have hypertension, you may need to reduce your sodium intake to 1,500 mg a day.  When  on the DASH eating plan, aim to eat more fresh fruits and vegetables, whole grains, lean proteins, low-fat dairy, and heart-healthy fats.  Work with your health care provider or diet and nutrition specialist (dietitian) to adjust your eating plan to your individual calorie needs. This information is not intended to replace advice given to you by your health care provider. Make sure you discuss any questions you have with your health care provider. Document Released: 02/27/2011 Document Revised: 03/03/2016 Document Reviewed: 03/03/2016 Elsevier Interactive Patient Education  [employer].

## 2018-03-05 NOTE — Assessment & Plan Note (Signed)
Better A1c last month; patient is actively working on weight loss and better eating; foot exam by MD today; eye exam UTD; goal syst BP < 130

## 2018-03-07 NOTE — Assessment & Plan Note (Addendum)
Encouraged weight loss; acknowledged the 6.5+ pounds he has lost in the last 5 weeks; keep it up to get BMI under 30 first goal

## 2018-03-08 ENCOUNTER — Ambulatory Visit: Payer: BC Managed Care – PPO | Admitting: Urology

## 2018-03-08 ENCOUNTER — Encounter

## 2018-03-08 ENCOUNTER — Telehealth: Payer: Self-pay | Admitting: *Deleted

## 2018-03-08 NOTE — Telephone Encounter (Signed)
I spoke with the patient. He states that he has been checking his BP since Sunday. I have asked that he continue to take these readings and call us back on Friday with those for Dr. Rockey Situ to review.  The patient voices understanding and is agreeable.

## 2018-03-08 NOTE — Telephone Encounter (Signed)
-----   Message from Minna Merritts, MD sent at 03/06/2018  4:15 PM EST ----- Regarding: FW: BP up today Can patient call us with some BP numbers from home Thx TGollan ----- Message ----- From: Arnetha Courser, MD Sent: 03/05/2018  12:10 PM EST To: Minna Merritts, MD Subject: BP up today                                    Greetings. Blood pressure is up today. I see that you prescribe his losartan and wanted to respectfully ask if you want him to check and call you back with readings in 1 week OR have him just adjust medicine now. He's here with me at 12:11 pm. Mason Jim [phone] cell

## 2018-03-12 ENCOUNTER — Encounter: Payer: Self-pay | Admitting: Family Medicine

## 2018-03-12 NOTE — Telephone Encounter (Signed)
Received patient's readings from MyChart message as follows:  Saturday 03/06/18 08:20am 147/74, HR 62 4pm  167/75, HR 72 Sunday 03/07/18 07:46am  153/80,  HR 60 Monday 03/08/18 08:30am  133/76, HR 57 9:25 pm 146/77, HR 68 Tuesday  03/09/18 09am       13 8/68, HR 64 5:50pm  149/76, HR 73 Wednesday 03/10/18 9:10am   137/72, HR 62 Thursday 03/11/18 08:00am  128/80, HR 69 7:35pm  136/80, HR 77 Friday  03/12/18 09:30am  135/72, HR 70  Routing to Dr Rockey Situ to review.

## 2018-03-13 NOTE — Telephone Encounter (Signed)
Would watch the salt intake Dash diet Continue to monitor periodically

## 2018-03-15 NOTE — Telephone Encounter (Signed)
Patient returning call.

## 2018-03-15 NOTE — Telephone Encounter (Signed)
Call attempted. LMTCB  

## 2018-03-15 NOTE — Telephone Encounter (Signed)
Call returned to patient to relay information from Dr. Rockey Situ.  Pt verbalized understanding and is agreeable to POC.   Advised pt to call for any further questions or concerns

## 2018-03-19 ENCOUNTER — Other Ambulatory Visit: Payer: Self-pay | Admitting: Cardiovascular Disease

## 2018-03-22 ENCOUNTER — Telehealth: Payer: Self-pay | Admitting: Family Medicine

## 2018-03-22 NOTE — Telephone Encounter (Signed)
Copied from [place] [id number]. Topic: Quick Communication - See Telephone Encounter >> Mar 22, 2018 10:17 AM Blase Mess A wrote: CRM for notification. See Telephone encounter for: 03/22/18.  Patient is calling to get his lab results sent to [facility] [phone], [phone] Please advise

## 2018-03-22 NOTE — Telephone Encounter (Signed)
Faxed

## 2018-04-02 LAB — HM DIABETES EYE EXAM

## 2018-04-07 ENCOUNTER — Telehealth: Payer: Self-pay | Admitting: Cardiovascular Disease

## 2018-04-07 ENCOUNTER — Other Ambulatory Visit: Payer: Self-pay

## 2018-04-07 MED ORDER — EVOLOCUMAB 140 MG/ML ~~LOC~~ SOAJ: 1.0000 "pen " | SUBCUTANEOUS | 6 refills | Status: DC

## 2018-04-07 NOTE — Telephone Encounter (Signed)
Spoke with pt concerning [place] card. Co-pay card placed upfront for pick up.

## 2018-04-07 NOTE — Telephone Encounter (Signed)
Please call pt regarding Repatha copay card. Pt is asking if he needed a new one for his rx.

## 2018-04-21 ENCOUNTER — Telehealth: Payer: Self-pay | Admitting: Cardiovascular Disease

## 2018-04-21 NOTE — Telephone Encounter (Signed)
Please call regarding authorization on Repatha. Pt states he needs to take this today.

## 2018-04-21 NOTE — Telephone Encounter (Signed)
Received call back from [facility]. Explained the situation and they said patient needs prior authorization through [facility].  I need to call [phone] or [phone].  Called [facility]. Was told that because Repatha is a specialty drug I needed to call [phone].  Called the [facility]. It took 4 tries to finally get through to someone. I was disconnected suddenly 3 different times. Eventually talked to someone who said the Prior auth was still active through 10/27/2018.   I then called [facility] and it went through. Notified patient and he was very Patent attorney.

## 2018-04-21 NOTE — Telephone Encounter (Signed)
Called patient. He says [facility] no longer takes the [place] card and it would be $90 for a 90 day supply. [employer] will honor the card if patient gets the prescription from [place] but they need a prior authorization.  Patient will come by to pick up a Repatha sample since he is due today to get him through until the prior authorization is complete.  Medication Samples have been provided to the patient.  Drug name: Repatha       Strength: 140mg /ml        Qty: 1 injection  LOT: [id number]  Exp.Date: 05/2020

## 2018-04-21 NOTE — Telephone Encounter (Signed)
Called [employer] to start the prior authorization process. Was on hold for about 19 minutes. Left message with [employer] to call us back to discuss and complete PA.

## 2018-05-14 DIAGNOSIS — M6530 Trigger finger, unspecified finger: Secondary | ICD-10-CM | POA: Insufficient documentation

## 2018-05-26 ENCOUNTER — Encounter: Payer: Self-pay | Admitting: Nurse Practitioner

## 2018-05-26 ENCOUNTER — Ambulatory Visit: Payer: BC Managed Care – PPO | Admitting: Nurse Practitioner

## 2018-05-26 VITALS — BP 148/82 | HR 74 | Temp 98.9°F | Resp 14 | Ht 76.0 in | Wt 266.4 lb

## 2018-05-26 DIAGNOSIS — S61311A Laceration without foreign body of left index finger with damage to nail, initial encounter: Secondary | ICD-10-CM | POA: Diagnosis not present

## 2018-05-26 NOTE — Patient Instructions (Signed)
Wound Care, Adult Taking care of your wound properly can help to prevent pain, infection, and scarring. It can also help your wound to heal more quickly. How to care for your wound Wound care - Dermabond will naturally come off in 5-10 days.  - Keep area clean & dry - Do not wet for 24 hours.  - Use small dab of neosporin to area and keep covered  - Follow up in 5-7 days.    Follow instructions from your health care provider about how to take care of your wound. Make sure you: ? Wash your hands with soap and water before you change the bandage (dressing). If soap and water are not available, use hand sanitizer. ? Change your dressing as told by your health care provider. ? Leave stitches (sutures), skin glue, or adhesive strips in place. These skin closures may need to stay in place for 2 weeks or longer. If adhesive strip edges start to loosen and curl up, you may trim the loose edges. Do not remove adhesive strips completely unless your health care provider tells you to do that.  Check your wound area every day for signs of infection. Check for: ? Redness, swelling, or pain. ? Fluid or blood. ? Warmth. ? Pus or a bad smell.  Ask your health care provider if you should clean the wound with mild soap and water. Doing this may include: ? Using a clean towel to pat the wound dry after cleaning it. Do not rub or scrub the wound. ? Applying a cream or ointment. Do this only as told by your health care provider. ? Covering the incision with a clean dressing.  Ask your health care provider when you can leave the wound uncovered.  Keep the dressing dry until your health care provider says it can be removed. Do not take baths, swim, use a hot tub, or do anything that would put the wound underwater until your health care provider approves. Ask your health care provider if you can take showers. You may only be allowed to take sponge baths. Medicines   If you were prescribed an antibiotic  medicine, cream, or ointment, take or use the antibiotic as told by your health care provider. Do not stop taking or using the antibiotic even if your condition improves.  Take over-the-counter and prescription medicines only as told by your health care provider. If you were prescribed pain medicine, take it 30 or more minutes before you do any wound care or as told by your health care provider. General instructions  Return to your normal activities as told by your health care provider. Ask your health care provider what activities are safe.  Do not scratch or pick at the wound.  Do not use any products that contain nicotine or tobacco, such as cigarettes and e-cigarettes. These may delay wound healing. If you need help quitting, ask your health care provider.  Keep all follow-up visits as told by your health care provider. This is important.  Eat a diet that includes protein, vitamin A, vitamin C, and other nutrient-rich foods to help the wound heal. ? Foods rich in protein include meat, dairy, beans, nuts, and other sources. ? Foods rich in vitamin A include carrots and dark green, leafy vegetables. ? Foods rich in vitamin C include citrus, tomatoes, and other fruits and vegetables. ? Nutrient-rich foods have protein, carbohydrates, fat, vitamins, or minerals. Eat a variety of healthy foods including vegetables, fruits, and whole grains. Contact a health care  provider if:  You received a tetanus shot and you have swelling, severe pain, redness, or bleeding at the injection site.  Your pain is not controlled with medicine.  You have redness, swelling, or pain around the wound.  You have fluid or blood coming from the wound.  Your wound feels warm to the touch.  You have pus or a bad smell coming from the wound.  You have a fever or chills.  You are nauseous or you vomit.  You are dizzy. Get help right away if:  You have a red streak going away from your wound.  The edges of  the wound open up and separate.  Your wound is bleeding, and the bleeding does not stop with gentle pressure.  You have a rash.  You faint.  You have trouble breathing. Summary  Always wash your hands with soap and water before changing your bandage (dressing).  To help with healing, eat foods that are rich in protein, vitamin A, vitamin C, and other nutrients.  Check your wound every day for signs of infection. Contact your health care provider if you suspect that your wound is infected. This information is not intended to replace advice given to you by your health care provider. Make sure you discuss any questions you have with your health care provider. Document Released: 12/18/2007 Document Revised: 04/21/2017 Document Reviewed: 09/25/2015 Elsevier Interactive Patient Education  2019 [employer].

## 2018-05-26 NOTE — Progress Notes (Addendum)
Name: Dean Sullivan MRN: [id number] DOB: June 30, 1955 Date:05/28/2018 Progress Note Subjective Chief Complaint Chief Complaint  Patient presents with  . Laceration    right pointer finger, 9mins ago cutting foam with razor blade, TD up to date   HPI Presents with laceration to tip of left index finger. 30 minutes ago he was using a new razor doing some craft work and cut left index finger. Razor intact. Cut through tip of nail and edge of finger. Bleeding controlled with gauze, no numbness or tingling.    Patient Active Problem List   Diagnosis Date Noted  . CAD (coronary artery disease), native coronary artery 09/07/2017  . Diabetes mellitus type 2 in obese ([place]) 03/18/2017  . Colon cancer screening 02/19/2017  . Screening for prostate cancer 09/10/2016  . Medication monitoring encounter 03/12/2016  . Trigger finger of right hand 02/20/2015  . Hyperlipidemia LDL goal <70 01/05/2015  . Status post coronary artery stent placement 01/05/2015  . Hypertension goal BP (blood pressure) < 130/80 01/05/2015  . Obesity 01/05/2015  . Abdominal obesity-metabolic syndrome type 3 27/78/2423  . Nasal sinus congestion 01/05/2015  . Right foot pain 01/05/2015  . Obstructive sleep apnea on CPAP 01/05/2015   Past Medical History:  Diagnosis Date  . Allergy   . Blockage of coronary artery bypass graft    patient stated that he had blockages - stents were inserted.  . Coronary arteriosclerosis in native artery   . Diabetes mellitus without complication ([place])   . GERD (gastroesophageal reflux disease)    occ  . Glaucoma   . Hearing loss of right ear    patient was told he had slight hearing loss  . History of kidney stones    h/o  . History of trigger finger    bilateral, but mostly in right thumb  . Hyperlipidemia   . Hypertension   . Hypertension goal BP (blood pressure) < 130/80 01/05/2015  . Irregular heart beat   . Sleep apnea    cpap  . Type 2 diabetes mellitus ([place]) 03/18/2017    A1c 6.28 Jan 2017   Past Surgical History:  Procedure Laterality Date  . CARDIAC CATHETERIZATION     x2 stents [place]  2012   2  . COLONOSCOPY WITH PROPOFOL N/A 04/21/2017   Procedure: COLONOSCOPY WITH PROPOFOL;  Surgeon: Jonathon Bellows, MD;  Location: [facility] ENDOSCOPY;  Service: Gastroenterology;  Laterality: N/A;  . LIPOMA EXCISION Left 03/09/2017   Procedure: EXCISION LIPOMA;  Surgeon: Earnestine Leys, MD;  Location: [facility] ORS;  Service: Orthopedics;  Laterality: Left;  . PROSTATE BIOPSY  03/03/2018  . RESECTION DISTAL CLAVICAL Left 03/09/2017   Procedure: RESECTION DISTAL CLAVICAL;  Surgeon: Earnestine Leys, MD;  Location: [facility] ORS;  Service: Orthopedics;  Laterality: Left;  . SHOULDER ARTHROSCOPY WITH OPEN ROTATOR CUFF REPAIR Left 03/09/2017   Procedure: SHOULDER ARTHROSCOPY WITH OPEN ROTATOR CUFF REPAIR;  Surgeon: Earnestine Leys, MD;  Location: [facility] ORS;  Service: Orthopedics;  Laterality: Left;  . SUBACROMIAL DECOMPRESSION Left 03/09/2017   Procedure: SUBACROMIAL DECOMPRESSION;  Surgeon: Earnestine Leys, MD;  Location: [facility] ORS;  Service: Orthopedics;  Laterality: Left;  BICEPS TENOTOMY   Social History   Tobacco Use  . Smoking status: Never Smoker  . Smokeless tobacco: Never Used  Substance Use Topics  . Alcohol use: Yes    Alcohol/week: 0.0 standard drinks    Comment: a glass of wine/beer    Current Outpatient Medications:  .  aspirin 81 MG tablet, Take  81 mg by mouth daily., Disp: , Rfl:  .  cetirizine (ZYRTEC) 10 MG tablet, Take 10 mg by mouth daily., Disp: , Rfl:  .  Cholecalciferol (VITAMIN D) 2000 UNITS tablet, Take 2,000 Units by mouth daily. , Disp: , Rfl:  .  dorzolamide (TRUSOPT) 2 % ophthalmic solution, Apply 1 drop to eye 3 (three) times daily., Disp: , Rfl:  .  Evolocumab (REPATHA SURECLICK) 270 MG/ML SOAJ, Inject 1 pen into the skin every 14 (fourteen) days., Disp: 2 pen, Rfl: 6 .  famotidine (PEPCID) 20 MG tablet, Take 1 tablet  (20 mg total) by mouth daily as needed for heartburn or indigestion., Disp: 30 tablet, Rfl: 5 .  IBUPROFEN PO, Take 400 mg by mouth daily as needed., Disp: , Rfl:  .  latanoprost (XALATAN) 0.005 % ophthalmic solution, Place 1 drop into both eyes at bedtime. , Disp: , Rfl:  .  losartan (COZAAR) 50 MG tablet, TAKE 1 TABLET BY MOUTH ONCE DAILY, Disp: 90 tablet, Rfl: 2 .  rosuvastatin (CRESTOR) 20 MG tablet, TAKE 1 TABLET BY MOUTH AT BEDTIME, Disp: 90 tablet, Rfl: 3 .  rosuvastatin (CRESTOR) 5 MG tablet, Take 1 tablet (5 mg total) by mouth daily. (Patient taking differently: Take 5 mg by mouth at bedtime. ), Disp: 90 tablet, Rfl: 3 .  sildenafil (REVATIO) 20 MG tablet, Take 1 tablet (20 mg total) by mouth 3 (three) times daily., Disp: 90 tablet, Rfl: 6 .  levofloxacin (LEVAQUIN) 500 MG tablet, Take 500 mg by mouth daily., Disp: , Rfl:  No Known Allergies ROS  No other specific complaints in a complete review of systems (except as listed in HPI above). Objective Vitals:   05/26/18 1315  BP: (!) 148/82  Pulse: 74  Resp: 14  Temp: 98.9 F (37.2 C)  TempSrc: Oral  SpO2: 99%  Weight: 266 lb 6.4 oz (120.8 kg)  Height: 6\' 4"  (1.93 m)    Body mass index is 32.43 kg/m. Nursing Note and Vital Signs reviewed. Physical Exam Constitutional:      Appearance: Normal appearance.  HENT:     Head: Normocephalic and atraumatic.  Cardiovascular:     Rate and Rhythm: Normal rate.  Pulmonary:     Effort: Pulmonary effort is normal.  Musculoskeletal:     Right hand: Normal. He exhibits no laceration.     Left hand: He exhibits laceration. He exhibits normal range of motion, no tenderness and normal capillary refill. Normal sensation noted. Normal strength noted.       Hands:  Neurological:     General: No focal deficit present.     Mental Status: He is alert and oriented to person, place, and time.     Sensory: No sensory deficit.     Motor: No weakness.  Psychiatric:        Mood and Affect:  Mood normal.        Behavior: Behavior normal.     ? No results found for this or any previous visit (from the past 48 hour(s)). Assessment & Plan 1. Laceration of left index finger without foreign body with damage to nail, initial encounter und cleaned with saline; finger cleaned with peroxide. Bleeding well-controlled. Patient requesting sutures- does not require, used dermabond. Discussed precautions, razor intact- laceration does not appear deep unlikely foreign body or fracture-offered xray- declined. See avs - Dermabond - Change dressing

## 2018-05-26 NOTE — Progress Notes (Deleted)
Name: Dean Sullivan   MRN: [id number]    DOB: 10/04/1955   Date:05/26/2018       Progress Note  Subjective  Chief Complaint  No chief complaint on file.   HPI  ***  Patient Active Problem List   Diagnosis Date Noted  . CAD (coronary artery disease), native coronary artery 09/07/2017  . Diabetes mellitus type 2 in obese ([place]) 03/18/2017  . Colon cancer screening 02/19/2017  . Screening for prostate cancer 09/10/2016  . Medication monitoring encounter 03/12/2016  . Trigger finger of right hand 02/20/2015  . Hyperlipidemia LDL goal <70 01/05/2015  . Status post coronary artery stent placement 01/05/2015  . Hypertension goal BP (blood pressure) < 130/80 01/05/2015  . Obesity 01/05/2015  . Abdominal obesity-metabolic syndrome type 3 35/36/1443  . Nasal sinus congestion 01/05/2015  . Right foot pain 01/05/2015  . Obstructive sleep apnea on CPAP 01/05/2015    Past Medical History:  Diagnosis Date  . Allergy   . Blockage of coronary artery bypass graft    patient stated that he had blockages - stents were inserted.  . Coronary arteriosclerosis in native artery   . Diabetes mellitus without complication ([place])   . GERD (gastroesophageal reflux disease)    occ  . Glaucoma   . Hearing loss of right ear    patient was told he had slight hearing loss  . History of kidney stones    h/o  . History of trigger finger    bilateral, but mostly in right thumb  . Hyperlipidemia   . Hypertension   . Hypertension goal BP (blood pressure) < 130/80 01/05/2015  . Irregular heart beat   . Sleep apnea    cpap  . Type 2 diabetes mellitus ([place]) 03/18/2017   A1c 6.28 Jan 2017    Past Surgical History:  Procedure Laterality Date  . CARDIAC CATHETERIZATION     x2 stents [place]  2012   2  . COLONOSCOPY WITH PROPOFOL N/A 04/21/2017   Procedure: COLONOSCOPY WITH PROPOFOL;  Surgeon: Jonathon Bellows, MD;  Location[facility] ENDOSCOPY;  Service: Gastroenterology;   Laterality: N/A;  . LIPOMA EXCISION Left 03/09/2017   Procedure: EXCISION LIPOMA;  Surgeon: Earnestine Leys, MD;  Location: [facility] ORS;  Service: Orthopedics;  Laterality: Left;  . PROSTATE BIOPSY  03/03/2018  . RESECTION DISTAL CLAVICAL Left 03/09/2017   Procedure: RESECTION DISTAL CLAVICAL;  Surgeon: Earnestine Leys, MD;  Location: [facility] ORS;  Service: Orthopedics;  Laterality: Left;  . SHOULDER ARTHROSCOPY WITH OPEN ROTATOR CUFF REPAIR Left 03/09/2017   Procedure: SHOULDER ARTHROSCOPY WITH OPEN ROTATOR CUFF REPAIR;  Surgeon: Earnestine Leys, MD;  Location: [facility] ORS;  Service: Orthopedics;  Laterality: Left;  . SUBACROMIAL DECOMPRESSION Left 03/09/2017   Procedure: SUBACROMIAL DECOMPRESSION;  Surgeon: Earnestine Leys, MD;  Location: [facility] ORS;  Service: Orthopedics;  Laterality: Left;  BICEPS TENOTOMY    Social History   Tobacco Use  . Smoking status: Never Smoker  . Smokeless tobacco: Never Used  Substance Use Topics  . Alcohol use: Yes    Alcohol/week: 0.0 standard drinks    Comment: a glass of wine/beer     Current Outpatient Medications:  .  aspirin 81 MG tablet, Take 81 mg by mouth daily., Disp: , Rfl:  .  cetirizine (ZYRTEC) 10 MG tablet, Take 10 mg by mouth daily., Disp: , Rfl:  .  Cholecalciferol (VITAMIN D) 2000 UNITS tablet, Take 2,000 Units by mouth daily. , Disp: , Rfl:  .  Evolocumab (REPATHA SURECLICK) 166 MG/ML SOAJ, Inject 1 pen into the skin every 14 (fourteen) days., Disp: 2 pen, Rfl: 6 .  famotidine (PEPCID) 20 MG tablet, Take 1 tablet (20 mg total) by mouth daily as needed for heartburn or indigestion., Disp: 30 tablet, Rfl: 5 .  IBUPROFEN PO, Take 400 mg by mouth daily as needed., Disp: , Rfl:  .  latanoprost (XALATAN) 0.005 % ophthalmic solution, Place 1 drop into both eyes at bedtime. , Disp: , Rfl:  .  levofloxacin (LEVAQUIN) 500 MG tablet, Take 500 mg by mouth daily., Disp: , Rfl:  .  losartan (COZAAR) 50 MG tablet, TAKE 1 TABLET BY MOUTH ONCE DAILY, Disp: 90  tablet, Rfl: 2 .  rosuvastatin (CRESTOR) 20 MG tablet, TAKE 1 TABLET BY MOUTH AT BEDTIME, Disp: 90 tablet, Rfl: 3 .  rosuvastatin (CRESTOR) 5 MG tablet, Take 1 tablet (5 mg total) by mouth daily. (Patient taking differently: Take 5 mg by mouth at bedtime. ), Disp: 90 tablet, Rfl: 3 .  sildenafil (REVATIO) 20 MG tablet, Take 1 tablet (20 mg total) by mouth 3 (three) times daily. (Patient not taking: Reported on 03/05/2018), Disp: 90 tablet, Rfl: 6  No Known Allergies  ROS  ***  No other specific complaints in a complete review of systems (except as listed in HPI above).  Objective  There were no vitals filed for this visit. ***  There is no height or weight on file to calculate BMI.  Nursing Note and Vital Signs reviewed.  Physical Exam  ***   No results found for this or any previous visit (from the past 48 hour(s)).  Assessment & Plan  There are no diagnoses linked to this encounter.   -Red flags and when to present for emergency care or RTC including fever >101.60F, chest pain, shortness of breath, new/worsening/un-resolving symptoms, *** reviewed with patient at time of visit. Follow up and care instructions discussed and provided in AVS. -Reviewed Health Maintenance: ***

## 2018-06-02 ENCOUNTER — Encounter: Payer: Self-pay | Admitting: Nurse Practitioner

## 2018-06-02 ENCOUNTER — Other Ambulatory Visit: Payer: Self-pay

## 2018-06-02 ENCOUNTER — Ambulatory Visit: Payer: BC Managed Care – PPO | Admitting: Nurse Practitioner

## 2018-06-02 VITALS — BP 148/72 | HR 95 | Temp 98.4°F | Resp 16 | Ht 76.0 in | Wt 269.2 lb

## 2018-06-02 DIAGNOSIS — S61311A Laceration without foreign body of left index finger with damage to nail, initial encounter: Secondary | ICD-10-CM | POA: Diagnosis not present

## 2018-06-02 DIAGNOSIS — I10 Essential (primary) hypertension: Secondary | ICD-10-CM | POA: Diagnosis not present

## 2018-06-02 NOTE — Patient Instructions (Addendum)
-   Please keep your finger laceration clean and dry, use antibacterial ointment at least once a day. If you notice yellow drainage, increased heat, increase swelling, redness or streaking down finger please get immediate medical attention   - Please check blood pressure 1-2 times a week over the next month; if it remains above goal please schedule a visit here or with Dr. Rockey Situ.  Your goal blood pressure is less than 140 mmHg on top; 25mmHg on bottom. Try to follow the DASH guidelines (DASH stands for Dietary Approaches to Stop Hypertension) Try to limit the sodium in your diet.  Ideally, consume less than 1.5 grams (less than 1,500mg ) per day. Do not add salt when cooking or at the table.  Check the sodium amount on labels when shopping, and choose items lower in sodium when given a choice. Avoid or limit foods that already contain a lot of sodium. Eat a diet rich in fruits and vegetables and whole grains.

## 2018-06-02 NOTE — Progress Notes (Signed)
Name: Dean Sullivan   MRN: [id number]    DOB: Mar 01, 1956   Date:06/02/2018       Progress Note  Subjective  Chief Complaint  Chief Complaint  Patient presents with  . Follow-up    1 week f/u    HPI  Left index finger laceration Patient presents for follow-up arm left fingernail laceration, was covered with Dermabond to facilitate healing.  Patient has been keeping area clean and dry.  After Dermabond dissolved he has been using Neosporin to the area.  Denies redness, fever, drainage.  Does note that he bumped his finger on his right coming here he noted some tenderness after injury.  Hypertension  Takes losartan 50mg  daily with no missed doses.  Patient sees Dr. Rockey Situ for hypertension, hyperlipidemia and coronary artery disease, previously had stent placed.  Blood pressure is above goal today.  Patient did recently go to [place], endorses poor diet there.  Denies headache, blurry vision, chest pain.  Does have a blood pressure monitor at home but does not routinely check his blood pressures.   BP Readings from Last 3 Encounters:  06/02/18 (!) 148/72  05/26/18 (!) 148/82  03/05/18 138/72      Patient Active Problem List   Diagnosis Date Noted  . CAD (coronary artery disease), native coronary artery 09/07/2017  . Diabetes mellitus type 2 in obese ([place]) 03/18/2017  . Colon cancer screening 02/19/2017  . Screening for prostate cancer 09/10/2016  . Medication monitoring encounter 03/12/2016  . Trigger finger of right hand 02/20/2015  . Hyperlipidemia LDL goal <70 01/05/2015  . Status post coronary artery stent placement 01/05/2015  . Hypertension goal BP (blood pressure) < 130/80 01/05/2015  . Obesity 01/05/2015  . Abdominal obesity-metabolic syndrome type 3 90/24/0973  . Nasal sinus congestion 01/05/2015  . Right foot pain 01/05/2015  . Obstructive sleep apnea on CPAP 01/05/2015    Past Medical History:  Diagnosis Date  . Allergy   . Blockage of coronary artery bypass  graft    patient stated that he had blockages - stents were inserted.  . Coronary arteriosclerosis in native artery   . Diabetes mellitus without complication ([place])   . GERD (gastroesophageal reflux disease)    occ  . Glaucoma   . Hearing loss of right ear    patient was told he had slight hearing loss  . History of kidney stones    h/o  . History of trigger finger    bilateral, but mostly in right thumb  . Hyperlipidemia   . Hypertension   . Hypertension goal BP (blood pressure) < 130/80 01/05/2015  . Irregular heart beat   . Sleep apnea    cpap  . Type 2 diabetes mellitus ([place]) 03/18/2017   A1c 6.28 Jan 2017    Past Surgical History:  Procedure Laterality Date  . CARDIAC CATHETERIZATION     x2 stents [place]  2012   2  . COLONOSCOPY WITH PROPOFOL N/A 04/21/2017   Procedure: COLONOSCOPY WITH PROPOFOL;  Surgeon: Jonathon Bellows, MD;  Location: [facility] ENDOSCOPY;  Service: Gastroenterology;  Laterality: N/A;  . LIPOMA EXCISION Left 03/09/2017   Procedure: EXCISION LIPOMA;  Surgeon: Earnestine Leys, MD;  Location: [facility] ORS;  Service: Orthopedics;  Laterality: Left;  . PROSTATE BIOPSY  03/03/2018  . RESECTION DISTAL CLAVICAL Left 03/09/2017   Procedure: RESECTION DISTAL CLAVICAL;  Surgeon: Earnestine Leys, MD;  Location: [facility] ORS;  Service: Orthopedics;  Laterality: Left;  . SHOULDER ARTHROSCOPY WITH OPEN  ROTATOR CUFF REPAIR Left 03/09/2017   Procedure: SHOULDER ARTHROSCOPY WITH OPEN ROTATOR CUFF REPAIR;  Surgeon: Earnestine Leys, MD;  Location: [facility] ORS;  Service: Orthopedics;  Laterality: Left;  . SUBACROMIAL DECOMPRESSION Left 03/09/2017   Procedure: SUBACROMIAL DECOMPRESSION;  Surgeon: Earnestine Leys, MD;  Location: [facility] ORS;  Service: Orthopedics;  Laterality: Left;  BICEPS TENOTOMY    Social History   Tobacco Use  . Smoking status: Never Smoker  . Smokeless tobacco: Never Used  Substance Use Topics  . Alcohol use: Yes    Alcohol/week: 0.0  standard drinks    Comment: a glass of wine/beer     Current Outpatient Medications:  .  aspirin 81 MG tablet, Take 81 mg by mouth daily., Disp: , Rfl:  .  cetirizine (ZYRTEC) 10 MG tablet, Take 10 mg by mouth daily., Disp: , Rfl:  .  Cholecalciferol (VITAMIN D) 2000 UNITS tablet, Take 2,000 Units by mouth daily. , Disp: , Rfl:  .  dorzolamide (TRUSOPT) 2 % ophthalmic solution, Apply 1 drop to eye 3 (three) times daily., Disp: , Rfl:  .  Evolocumab (REPATHA SURECLICK) 485 MG/ML SOAJ, Inject 1 pen into the skin every 14 (fourteen) days., Disp: 2 pen, Rfl: 6 .  famotidine (PEPCID) 20 MG tablet, Take 1 tablet (20 mg total) by mouth daily as needed for heartburn or indigestion., Disp: 30 tablet, Rfl: 5 .  IBUPROFEN PO, Take 400 mg by mouth daily as needed., Disp: , Rfl:  .  latanoprost (XALATAN) 0.005 % ophthalmic solution, Place 1 drop into both eyes at bedtime. , Disp: , Rfl:  .  levofloxacin (LEVAQUIN) 500 MG tablet, Take 500 mg by mouth daily., Disp: , Rfl:  .  losartan (COZAAR) 50 MG tablet, TAKE 1 TABLET BY MOUTH ONCE DAILY, Disp: 90 tablet, Rfl: 2 .  rosuvastatin (CRESTOR) 20 MG tablet, TAKE 1 TABLET BY MOUTH AT BEDTIME, Disp: 90 tablet, Rfl: 3 .  rosuvastatin (CRESTOR) 5 MG tablet, Take 1 tablet (5 mg total) by mouth daily. (Patient taking differently: Take 5 mg by mouth at bedtime. ), Disp: 90 tablet, Rfl: 3 .  sildenafil (REVATIO) 20 MG tablet, Take 1 tablet (20 mg total) by mouth 3 (three) times daily., Disp: 90 tablet, Rfl: 6  No Known Allergies  ROS   No other specific complaints in a complete review of systems (except as listed in HPI above).  Objective  Vitals:   06/02/18 0921 06/02/18 1009  BP: (!) 152/86 (!) 148/72  Pulse: 95   Resp: 16   Temp: 98.4 F (36.9 C)   TempSrc: Oral   SpO2: 98%   Weight: 269 lb 3.2 oz (122.1 kg)   Height: 6\' 4"  (1.93 m)     Body mass index is 32.77 kg/m.  Nursing Note and Vital Signs reviewed.  Physical Exam Constitutional:       Appearance: Normal appearance.  HENT:     Head: Normocephalic and atraumatic.     Nose: Nose normal.     Mouth/Throat:     Mouth: Mucous membranes are moist.     Pharynx: Oropharynx is clear.  Eyes:     Conjunctiva/sclera: Conjunctivae normal.  Cardiovascular:     Rate and Rhythm: Normal rate.  Pulmonary:     Effort: Pulmonary effort is normal.  Musculoskeletal:       Hands:  Skin:    General: Skin is warm and dry.  Neurological:     General: No focal deficit present.     Mental Status: He  is alert and oriented to person, place, and time.  Psychiatric:        Mood and Affect: Mood normal.        Behavior: Behavior normal.        No results found for this or any previous visit (from the past 48 hour(s)).  Assessment & Plan 1. Laceration of left index finger without foreign body with damage to nail, initial encounter Healing appropriately.  Discussed referral to hand specialist if concern of cosmetic appearance.  Patient declined.  Informed to monitor for signs of infection, delayed wound healing.  If having pain with nail growth to return for possible ingrown nail.  2. Essential hypertension Blood pressure is not at goal.  Patient will monitor blood pressure over the next few weeks with his home blood pressure cuff and provide [place] or his cardiologist with these readings.  Discussed importance of blood pressure control especially with history.  Will return to [facility] guidelines.  If it remains above goal at home will consider increasing losartan from 50-100 and repeat BMP and blood pressure recheck in 2 weeks.  Discussed this with him.  States will monitor and make follow-up appointment as necessary.

## 2018-07-27 ENCOUNTER — Encounter: Payer: Self-pay | Admitting: Nurse Practitioner

## 2018-07-27 ENCOUNTER — Other Ambulatory Visit: Payer: Self-pay

## 2018-07-27 ENCOUNTER — Ambulatory Visit (INDEPENDENT_AMBULATORY_CARE_PROVIDER_SITE_OTHER): Payer: BC Managed Care – PPO | Admitting: Nurse Practitioner

## 2018-07-27 VITALS — BP 154/90 | HR 84 | Ht 74.5 in | Wt 259.0 lb

## 2018-07-27 DIAGNOSIS — E1169 Type 2 diabetes mellitus with other specified complication: Secondary | ICD-10-CM

## 2018-07-27 DIAGNOSIS — I10 Essential (primary) hypertension: Secondary | ICD-10-CM | POA: Diagnosis not present

## 2018-07-27 DIAGNOSIS — G4733 Obstructive sleep apnea (adult) (pediatric): Secondary | ICD-10-CM

## 2018-07-27 DIAGNOSIS — Z9989 Dependence on other enabling machines and devices: Secondary | ICD-10-CM

## 2018-07-27 DIAGNOSIS — E785 Hyperlipidemia, unspecified: Secondary | ICD-10-CM

## 2018-07-27 DIAGNOSIS — E669 Obesity, unspecified: Secondary | ICD-10-CM

## 2018-07-27 DIAGNOSIS — I2510 Atherosclerotic heart disease of native coronary artery without angina pectoris: Secondary | ICD-10-CM

## 2018-07-27 DIAGNOSIS — Z5181 Encounter for therapeutic drug level monitoring: Secondary | ICD-10-CM

## 2018-07-27 NOTE — Progress Notes (Signed)
Virtual Visit via Video Note  I connected with Dean Sullivan on 07/27/18 at 10:20 AM EDT by a video enabled telemedicine application and verified that I am speaking with the correct person using two identifiers.   Staff discussed the limitations of evaluation and management by telemedicine and the availability of in person appointments. The patient expressed understanding and agreed to proceed.  Patient location: home  My location: home office Other people present: none HPI  Hypertension Takes losartan 50mg  daily Denies headaches, chest pain, blurry vision Has had more sodium in diet recently BP Readings from Last 3 Encounters:  07/27/18 (!) 154/90  06/02/18 (!) 148/72  05/26/18 (!) 148/82    OSA CPAP self reported compliance 100%  Diabetes Usually eats twice a day salad, chicken breast, corn flakes.  Lab Results  Component Value Date   HGBA1C 6.3 (H) 01/25/2018   Hyperlipidemia rx crestor 20mg  daily & repatha  Lab Results  Component Value Date   CHOL 139 01/25/2018   HDL 46 01/25/2018   LDLCALC 70 01/25/2018   TRIG 156 (H) 01/25/2018   CHOLHDL 3.0 01/25/2018    PHQ2/9: Depression screen [facility] 2/9 07/27/2018 06/02/2018 05/26/2018 03/05/2018 02/03/2018  Decreased Interest 0 0 0 0 0  Down, Depressed, Hopeless 0 0 0 0 0  PHQ - 2 Score 0 0 0 0 0  Altered sleeping 0 0 0 0 0  Tired, decreased energy 0 0 0 0 0  Change in appetite 0 0 0 0 0  Feeling bad or failure about yourself  0 0 0 0 0  Trouble concentrating 0 0 0 0 0  Moving slowly or fidgety/restless 0 0 0 0 0  Suicidal thoughts 0 0 0 0 0  PHQ-9 Score 0 0 0 0 0  Difficult doing work/chores Not difficult at all Not difficult at all Not difficult at all Not difficult at all Not difficult at all    PHQ reviewed. Negative   Patient Active Problem List   Diagnosis Date Noted  . CAD (coronary artery disease), native coronary artery 09/07/2017  . Diabetes mellitus type 2 in obese ([place]) 03/18/2017  . Colon cancer  screening 02/19/2017  . Screening for prostate cancer 09/10/2016  . Medication monitoring encounter 03/12/2016  . Trigger finger of right hand 02/20/2015  . Hyperlipidemia LDL goal <70 01/05/2015  . Status post coronary artery stent placement 01/05/2015  . Hypertension goal BP (blood pressure) < 130/80 01/05/2015  . Obesity 01/05/2015  . Abdominal obesity-metabolic syndrome type 3 58/85/0277  . Nasal sinus congestion 01/05/2015  . Right foot pain 01/05/2015  . Obstructive sleep apnea on CPAP 01/05/2015    Past Medical History:  Diagnosis Date  . Allergy   . Blockage of coronary artery bypass graft    patient stated that he had blockages - stents were inserted.  . Coronary arteriosclerosis in native artery   . Diabetes mellitus without complication ([place])   . GERD (gastroesophageal reflux disease)    occ  . Glaucoma   . Hearing loss of right ear    patient was told he had slight hearing loss  . History of kidney stones    h/o  . History of trigger finger    bilateral, but mostly in right thumb  . Hyperlipidemia   . Hypertension   . Hypertension goal BP (blood pressure) < 130/80 01/05/2015  . Irregular heart beat   . Sleep apnea    cpap  . Type 2 diabetes mellitus ([place]) 03/18/2017   A1c  6.28 Jan 2017    Past Surgical History:  Procedure Laterality Date  . CARDIAC CATHETERIZATION     x2 stents [place]  2012   2  . COLONOSCOPY WITH PROPOFOL N/A 04/21/2017   Procedure: COLONOSCOPY WITH PROPOFOL;  Surgeon: Jonathon Bellows, MD;  Location: [facility] ENDOSCOPY;  Service: Gastroenterology;  Laterality: N/A;  . LIPOMA EXCISION Left 03/09/2017   Procedure: EXCISION LIPOMA;  Surgeon: Earnestine Leys, MD;  Location: [facility] ORS;  Service: Orthopedics;  Laterality: Left;  . PROSTATE BIOPSY  03/03/2018  . RESECTION DISTAL CLAVICAL Left 03/09/2017   Procedure: RESECTION DISTAL CLAVICAL;  Surgeon: Earnestine Leys, MD;  Location: [facility] ORS;  Service: Orthopedics;   Laterality: Left;  . SHOULDER ARTHROSCOPY WITH OPEN ROTATOR CUFF REPAIR Left 03/09/2017   Procedure: SHOULDER ARTHROSCOPY WITH OPEN ROTATOR CUFF REPAIR;  Surgeon: Earnestine Leys, MD;  Location: [facility] ORS;  Service: Orthopedics;  Laterality: Left;  . SUBACROMIAL DECOMPRESSION Left 03/09/2017   Procedure: SUBACROMIAL DECOMPRESSION;  Surgeon: Earnestine Leys, MD;  Location: [facility] ORS;  Service: Orthopedics;  Laterality: Left;  BICEPS TENOTOMY    Social History   Tobacco Use  . Smoking status: Never Smoker  . Smokeless tobacco: Never Used  Substance Use Topics  . Alcohol use: Yes    Alcohol/week: 0.0 standard drinks    Comment: a glass of wine/beer     Current Outpatient Medications:  .  aspirin 81 MG tablet, Take 81 mg by mouth daily., Disp: , Rfl:  .  cetirizine (ZYRTEC) 10 MG tablet, Take 10 mg by mouth daily., Disp: , Rfl:  .  Cholecalciferol (VITAMIN D) 2000 UNITS tablet, Take 2,000 Units by mouth daily. , Disp: , Rfl:  .  dorzolamide (TRUSOPT) 2 % ophthalmic solution, Apply 1 drop to eye 3 (three) times daily., Disp: , Rfl:  .  dutasteride (AVODART) 0.5 MG capsule, Take 0.5 mg by mouth daily., Disp: , Rfl:  .  Evolocumab (REPATHA SURECLICK) 160 MG/ML SOAJ, Inject 1 pen into the skin every 14 (fourteen) days., Disp: 2 pen, Rfl: 6 .  latanoprost (XALATAN) 0.005 % ophthalmic solution, Place 1 drop into both eyes at bedtime. , Disp: , Rfl:  .  losartan (COZAAR) 50 MG tablet, TAKE 1 TABLET BY MOUTH ONCE DAILY, Disp: 90 tablet, Rfl: 2 .  rosuvastatin (CRESTOR) 20 MG tablet, TAKE 1 TABLET BY MOUTH AT BEDTIME, Disp: 90 tablet, Rfl: 3 .  levofloxacin (LEVAQUIN) 500 MG tablet, Take 500 mg by mouth daily., Disp: , Rfl:  .  sildenafil (REVATIO) 20 MG tablet, Take 1 tablet (20 mg total) by mouth 3 (three) times daily. (Patient not taking: Reported on 07/27/2018), Disp: 90 tablet, Rfl: 6  No Known Allergies  ROS  No other specific complaints in a complete review of systems (except as listed in  HPI above).  Objective  Vitals:   07/27/18 1009  BP: (!) 154/90  Pulse: 84  Weight: 259 lb (117.5 kg)  Height: 6' 2.5" (1.892 m)    Body mass index is 32.81 kg/m.  Nursing Note and Vital Signs reviewed.  Physical Exam  Constitutional: Patient appears well-developed and well-nourished. No distress.  HENT: Head: Normocephalic and atraumatic. Cardiovascular: Normal rate Pulmonary/Chest: Effort normal  Musculoskeletal: Normal range of motion,  Neurological: he is alert and oriented to person, place, and time. speech and gait are normal.  Skin: No rash noted. No erythema.  Psychiatric: Patient has a normal mood and affect. behavior is normal. Judgment and thought content normal.  Assessment & Plan  1. Hypertension goal BP (blood pressure) < 130/80 Uncontrolled, will increase losartan to 100mg  daily and work on diet states going to get on top of it and check BP's at least once a week and bring in log to one month follow-up  - COMPLETE METABOLIC PANEL WITH GFR; Future  2. Obstructive sleep apnea on CPAP Compliant, continue using   3. Diabetes mellitus type 2 in obese ([facility]) Discussed diet - HgB A1c; Future  4. Hyperlipidemia LDL goal <70 Discussed diet  - Lipid Profile; Future  5. Coronary artery disease involving native coronary artery of native heart without angina pectoris - Lipid Profile; Future  6. Medication monitoring encounter - COMPLETE METABOLIC PANEL WITH GFR; Future   Follow Up Instructions:   follow-up in one month for BP  2 weeks for labs only  I discussed the assessment and treatment plan with the patient. The patient was provided an opportunity to ask questions and all were answered. The patient agreed with the plan and demonstrated an understanding of the instructions.   The patient was advised to call back or seek an in-person evaluation if the symptoms worsen or if the condition fails to improve as anticipated.  I provided 22 minutes of  non-face-to-face time during this encounter; 17 minutes on video and additional time in chart review.   Fredderick Severance, NP

## 2018-08-10 ENCOUNTER — Other Ambulatory Visit: Payer: Self-pay

## 2018-08-10 DIAGNOSIS — E785 Hyperlipidemia, unspecified: Secondary | ICD-10-CM

## 2018-08-10 DIAGNOSIS — I2510 Atherosclerotic heart disease of native coronary artery without angina pectoris: Secondary | ICD-10-CM

## 2018-08-10 DIAGNOSIS — I10 Essential (primary) hypertension: Secondary | ICD-10-CM

## 2018-08-10 DIAGNOSIS — Z5181 Encounter for therapeutic drug level monitoring: Secondary | ICD-10-CM

## 2018-08-10 DIAGNOSIS — E119 Type 2 diabetes mellitus without complications: Secondary | ICD-10-CM

## 2018-08-10 DIAGNOSIS — E1169 Type 2 diabetes mellitus with other specified complication: Secondary | ICD-10-CM

## 2018-08-11 LAB — COMPLETE METABOLIC PANEL WITH GFR
AG Ratio: 1.7 (calc) (ref 1.0–2.5)
ALT: 16 U/L (ref 9–46)
AST: 16 U/L (ref 10–35)
Albumin: 4.3 g/dL (ref 3.6–5.1)
Alkaline phosphatase (APISO): 69 U/L (ref 35–144)
BUN: 12 mg/dL (ref 7–25)
CO2: 22 mmol/L (ref 20–32)
Calcium: 9.2 mg/dL (ref 8.6–10.3)
Chloride: 107 mmol/L (ref 98–110)
Creat: 1.04 mg/dL (ref 0.70–1.25)
GFR, Est African American: 89 mL/min/{1.73_m2} (ref >=60)
GFR, Est Non African American: 77 mL/min/{1.73_m2} (ref >=60)
Globulin: 2.5 g/dL (calc) (ref 1.9–3.7)
Glucose, Bld: 97 mg/dL (ref 65–99)
Potassium: 4 mmol/L (ref 3.5–5.3)
Sodium: 138 mmol/L (ref 135–146)
Total Bilirubin: 1.2 mg/dL (ref 0.2–1.2)
Total Protein: 6.8 g/dL (ref 6.1–8.1)

## 2018-08-11 LAB — LIPID PANEL
Cholesterol: 62 mg/dL (ref <200)
HDL: 31 mg/dL — ABNORMAL LOW (ref >=40)
LDL Cholesterol (Calc): 15 mg/dL (calc)
Non-HDL Cholesterol (Calc): 31 mg/dL (calc) (ref <130)
Total CHOL/HDL Ratio: 2 (calc) (ref <5.0)
Triglycerides: 78 mg/dL (ref <150)

## 2018-08-11 LAB — HEMOGLOBIN A1C
Hgb A1c MFr Bld: 5.7 % of total Hgb — ABNORMAL HIGH (ref <5.7)
Mean Plasma Glucose: 117 (calc)
eAG (mmol/L): 6.5 (calc)

## 2018-09-03 ENCOUNTER — Other Ambulatory Visit: Payer: Self-pay | Admitting: Cardiovascular Disease

## 2018-09-10 ENCOUNTER — Telehealth: Payer: Self-pay | Admitting: Cardiovascular Disease

## 2018-09-10 ENCOUNTER — Other Ambulatory Visit
Admission: RE | Admit: 2018-09-10 | Discharge: 2018-09-10 | Disposition: A | Discharge status: Home / Self Care | Payer: BC Managed Care – PPO | Source: Ambulatory Visit | Attending: Physician Assistant | Admitting: Physician Assistant

## 2018-09-10 ENCOUNTER — Ambulatory Visit: Payer: BC Managed Care – PPO | Admitting: Physician Assistant

## 2018-09-10 ENCOUNTER — Other Ambulatory Visit: Payer: Self-pay

## 2018-09-10 ENCOUNTER — Ambulatory Visit (INDEPENDENT_AMBULATORY_CARE_PROVIDER_SITE_OTHER): Payer: BC Managed Care – PPO

## 2018-09-10 ENCOUNTER — Encounter: Payer: Self-pay | Admitting: Physician Assistant

## 2018-09-10 ENCOUNTER — Telehealth: Payer: Self-pay | Admitting: Physician Assistant

## 2018-09-10 VITALS — BP 156/90 | HR 77 | Ht 75.0 in | Wt 274.0 lb

## 2018-09-10 DIAGNOSIS — R002 Palpitations: Secondary | ICD-10-CM

## 2018-09-10 DIAGNOSIS — R0602 Shortness of breath: Secondary | ICD-10-CM

## 2018-09-10 DIAGNOSIS — I493 Ventricular premature depolarization: Secondary | ICD-10-CM

## 2018-09-10 DIAGNOSIS — I10 Essential (primary) hypertension: Secondary | ICD-10-CM

## 2018-09-10 DIAGNOSIS — E785 Hyperlipidemia, unspecified: Secondary | ICD-10-CM

## 2018-09-10 DIAGNOSIS — Z6835 Body mass index (BMI) 35.0-35.9, adult: Secondary | ICD-10-CM

## 2018-09-10 DIAGNOSIS — I2510 Atherosclerotic heart disease of native coronary artery without angina pectoris: Secondary | ICD-10-CM

## 2018-09-10 DIAGNOSIS — Z9989 Dependence on other enabling machines and devices: Secondary | ICD-10-CM

## 2018-09-10 DIAGNOSIS — G4733 Obstructive sleep apnea (adult) (pediatric): Secondary | ICD-10-CM

## 2018-09-10 LAB — BASIC METABOLIC PANEL
Anion gap: 10 (ref 5–15)
BUN: 10 mg/dL (ref 8–23)
CO2: 22 mmol/L (ref 22–32)
Calcium: 9.3 mg/dL (ref 8.9–10.3)
Chloride: 107 mmol/L (ref 98–111)
Creatinine, Ser: 1.02 mg/dL (ref 0.61–1.24)
GFR calc Af Amer: >60 mL/min (ref >60)
GFR calc non Af Amer: >60 mL/min (ref >60)
Glucose, Bld: 115 mg/dL — ABNORMAL HIGH (ref 70–99)
Potassium: 3.6 mmol/L (ref 3.5–5.1)
Sodium: 139 mmol/L (ref 135–145)

## 2018-09-10 LAB — CBC
HCT: 44.7 % (ref 39.0–52.0)
Hemoglobin: 14.5 g/dL (ref 13.0–17.0)
MCH: 27.2 pg (ref 26.0–34.0)
MCHC: 32.4 g/dL (ref 30.0–36.0)
MCV: 83.7 fL (ref 80.0–100.0)
Platelets: 240 10*3/uL (ref 150–400)
RBC: 5.34 MIL/uL (ref 4.22–5.81)
RDW: 14.6 % (ref 11.5–15.5)
WBC: 4.7 10*3/uL (ref 4.0–10.5)
nRBC: 0 % (ref 0.0–0.2)

## 2018-09-10 LAB — TSH: TSH: 1.343 u[IU]/mL (ref 0.350–4.500)

## 2018-09-10 LAB — MAGNESIUM: Magnesium: 2.2 mg/dL (ref 1.7–2.4)

## 2018-09-10 MED ORDER — METOPROLOL SUCCINATE ER 25 MG PO TB24: 12.5000 mg | ORAL_TABLET | Freq: Every day | ORAL | 3 refills | Status: DC

## 2018-09-10 MED ORDER — LOSARTAN POTASSIUM 100 MG PO TABS: 100.0000 mg | ORAL_TABLET | Freq: Every day | ORAL | 3 refills | Status: DC

## 2018-09-10 NOTE — Telephone Encounter (Signed)
I spoke with the patient. He states he has been having heart "pounding" intermittently, but almost daily for the last 3 weeks. He has also been noticing some increase in his BP.   He states he cut his finger 3 weeks ago and had to be seen.  He noticed at his visit that his SBP was in the 140's. His losartan was increased from 1/2 tablet (25 mg) to a full tablet (50 mg) once daily. SBP continued to be in the 140's.  He states that he has since increased his losartan up to 100 mg once daily.   I asked if his BP is still running 140's on the increased losartan. He was unclear if this was any better as he started talking about his palpitations again.  He asked if there was something would be give him to help with this. I advised that we need to know what his going on with his heart rhythm to know what/ how to treat him.  He is aware that he will most likely need a heart montior. He is due for his 1 year follow up with Dr. Rockey Situ.  I advised I could offer him an appointment for further evaluation today at 11:30 am with Christell Faith, PA. He is aware that an EKG can be performed and if unrevealing he may need a heart monitor from 24 hours up to 2 weeks.  He is aware that he may also needs labs to check his electrolytes (althought CMET normal 1 month ago)- no magnesium was checked.  Last CBC/ TSH were normal in 01/2018.   The patient is agreeable with further evaluation in the office today.   Screening has been done and documented in a separate encounter.

## 2018-09-10 NOTE — Telephone Encounter (Signed)
    COVID-19 Pre-Screening Questions:  . In the past 7 to 10 days have you had a cough,  shortness of breath, headache, congestion, fever (100 or greater) body aches, chills, sore throat, or sudden loss of taste or sense of smell? NO . Have you been around anyone with known Covid 19. NO . Have you been around anyone who is awaiting Covid 19 test results in the past 7 to 10 days? NO . Have you been around anyone who has been exposed to Covid 19, or has mentioned symptoms of Covid 19 within the past 7 to 10 days? NO  If you have any concerns/questions about symptoms patients report during screening (either on the phone or at threshold). Contact the provider seeing the patient or DOD for further guidance.  If neither are available contact a member of the leadership team.    Appt scheduled for 09/10/18 at 11:30 am with Christell Faith, PA.

## 2018-09-10 NOTE — Telephone Encounter (Signed)
Patient states he has had several episodes of rapid HR. States he can "feel my heart beat in my head".  Also, states he has had a bad headache. States he always has a headache, dull, and sometimes feels "forceful" heartbeat.   Patient c/o Palpitations:  High priority if patient c/o lightheadedness, shortness of breath, or chest pain  1) How long have you had palpitations/irregular HR/ Afib? Are you having the symptoms now? Yes. Hx of irregular HR, pressure in head as of now  2) Are you currently experiencing lightheadedness, SOB or CP? When he 1st starts walking, he has SOB, but as he continues it goes away.  3) Do you have a history of afib (atrial fibrillation) or irregular heart rhythm? yes  4) Have you checked your BP or HR? (document readings if available): states a month ago, went to Doctor for a cut  And his BP was 140/150, states he has doubled his Losartan and his systolic reading has not went below 140s/150s  5) Are you experiencing any other symptoms? no

## 2018-09-10 NOTE — Progress Notes (Signed)
Cardiology Office Note Date:  09/10/2018  Patient ID:  Dean Sullivan, Dean Sullivan Oct 12, 1955, MRN [id number] PCP:  [facility]  Cardiologist:  Dr. Rockey Situ, MD    Chief Complaint: Palpitations  History of Present Illness: Dean Sullivan is a 63 y.o. male with history of CAD status post remote PCI to the LAD and RCA as detailed below, DM2, hypertension, hyperlipidemia on Crestor and Repatha intolerant to Zetia and WelChol, obesity, and OSA on CPAP who presents for evaluation of palpitations and elevated blood pressure.  Patient underwent cardiac cath in 04/2012 at outside hospital, in the setting of an abnormal Myoview, which showed mild plaquing of the left main without obstructive disease, proximal LAD 70-75% stenosis, moderate plaquing of the LCx without obstructive disease, large and dominant RCA with 85 to 95% mid RCA stenosis, EF estimated at 55 to 60%, mild LV chamber dilation with mild global hypokinesis.  The patient underwent successful PCI/DES to the LAD and RCA with residual 10% stenosis (details unclear).  No ischemic evaluations since.  He was last seen in 08/2017 for routine follow-up and was without chest pain.  His BP was mildly elevated in the office though reported better at home.  Patient was seen by PCP and 01/2018 with palpitations and left shoulder pain.  Patient declined further cardiac work-up at that time.  He felt like his shoulder pain was in the setting of his prior rotator cuff surgery.  Patient contacted our office this morning noting a 3-week history of palpitations as well as elevated blood pressure running in the 026V to 785Y systolic leading to titration of his losartan to 50 mg once daily without reported significant improvement in blood pressures leading to further titration of losartan up to 100 mg daily in early 07/2018.  Patient comes in today noting a 3-week history of worsening palpitations.  He states his history of palpitations dates back to 2014  following his PCI as outlined above.  However, more recently his palpitations have been more noticeable and more frequent.  He particularly notices them when he is exerting himself or "excited and getting ready to have sex."  He has on occasion noted these palpitations if he eats a large, heavy meal.  Palpitations are described as a "heart pounding" sensation that is isolated.  There is no associated dizziness, chest pain, presyncope, or syncope with these palpitations.  His shortness of breath is stable and at baseline.  He has also noted over the past several months his blood pressure has been remaining in the 850Y to 774J systolic despite escalation of losartan as above.  He does try and watch his sodium intake though at times has slacked off on this, most recently last evening when he ate ribs."  Labs: 07/2018 - LDL 15, serum creatinine 1.04, potassium 4.0, albumin 4.3, AST/ALT, A1c 5.7 normal 01/2018 - TSH normal, hemoglobin 13.9   Past Medical History:  Diagnosis Date   Allergy    Coronary arteriosclerosis in native artery    GERD (gastroesophageal reflux disease)    occ   Glaucoma    Hearing loss of right ear    patient was told he had slight hearing loss   History of kidney stones    h/o   History of trigger finger    bilateral, but mostly in right thumb   Hyperlipidemia    Hypertension    Irregular heart beat    Sleep apnea    cpap   Type 2 diabetes mellitus ([place]) 03/18/2017  A1c 6.28 Jan 2017    Past Surgical History:  Procedure Laterality Date   CARDIAC CATHETERIZATION     x2 stents [place]  2012   2   COLONOSCOPY WITH PROPOFOL N/A 04/21/2017   Procedure: COLONOSCOPY WITH PROPOFOL;  Surgeon: Jonathon Bellows, MD;  Location: [facility] ENDOSCOPY;  Service: Gastroenterology;  Laterality: N/A;   LIPOMA EXCISION Left 03/09/2017   Procedure: EXCISION LIPOMA;  Surgeon: Earnestine Leys, MD;  Location: [facility] ORS;  Service: Orthopedics;   Laterality: Left;   PROSTATE BIOPSY  03/03/2018   RESECTION DISTAL CLAVICAL Left 03/09/2017   Procedure: RESECTION DISTAL CLAVICAL;  Surgeon: Earnestine Leys, MD;  Location: [facility] ORS;  Service: Orthopedics;  Laterality: Left;   SHOULDER ARTHROSCOPY WITH OPEN ROTATOR CUFF REPAIR Left 03/09/2017   Procedure: SHOULDER ARTHROSCOPY WITH OPEN ROTATOR CUFF REPAIR;  Surgeon: Earnestine Leys, MD;  Location: [facility] ORS;  Service: Orthopedics;  Laterality: Left;   SUBACROMIAL DECOMPRESSION Left 03/09/2017   Procedure: SUBACROMIAL DECOMPRESSION;  Surgeon: Earnestine Leys, MD;  Location: [facility] ORS;  Service: Orthopedics;  Laterality: Left;  BICEPS TENOTOMY    Current Meds  Medication Sig   aspirin 81 MG tablet Take 81 mg by mouth daily.   cetirizine (ZYRTEC) 10 MG tablet Take 10 mg by mouth daily.   dorzolamide (TRUSOPT) 2 % ophthalmic solution Apply 1 drop to eye 3 (three) times daily.   dutasteride (AVODART) 0.5 MG capsule Take 0.5 mg by mouth daily.   Evolocumab (REPATHA SURECLICK) 518 MG/ML SOAJ Inject 1 pen into the skin every 14 (fourteen) days.   latanoprost (XALATAN) 0.005 % ophthalmic solution Place 1 drop into both eyes at bedtime.    levofloxacin (LEVAQUIN) 500 MG tablet Take 500 mg by mouth daily.   losartan (COZAAR) 100 MG tablet Take 1 tablet (100 mg total) by mouth daily.   rosuvastatin (CRESTOR) 20 MG tablet TAKE 1 TABLET BY MOUTH AT BEDTIME   VITAMIN D PO Take 5,000 mcg by mouth daily.   [DISCONTINUED] losartan (COZAAR) 50 MG tablet Take 1 tablet by mouth once daily    Allergies:   Patient has no known allergies.   Social History:  The patient  reports that he has never smoked. He has never used smokeless tobacco. He reports current alcohol use. He reports that he does not use drugs.   Family History:  The patient's family history includes Alcohol abuse in his father; Arthritis in his maternal grandmother; Cancer in his father; Diabetes in his mother; Heart Problems in his  father; Other in his maternal aunt; Stroke in his father.  ROS:   Review of Systems  Constitutional: Positive for malaise/fatigue. Negative for chills, diaphoresis, fever and weight loss.  HENT: Negative for congestion.   Eyes: Negative for discharge and redness.  Respiratory: Positive for shortness of breath. Negative for cough, hemoptysis, sputum production and wheezing.   Cardiovascular: Positive for palpitations. Negative for chest pain, orthopnea, claudication, leg swelling and PND.  Gastrointestinal: Positive for diarrhea. Negative for abdominal pain, blood in stool, constipation, heartburn, melena, nausea and vomiting.  Genitourinary: Negative for hematuria.  Musculoskeletal: Negative for falls and myalgias.  Skin: Negative for rash.  Neurological: Negative for dizziness, tingling, tremors, sensory change, speech change, focal weakness, loss of consciousness and weakness.  Endo/Heme/Allergies: Does not bruise/bleed easily.  Psychiatric/Behavioral: Negative for substance abuse. The patient is not nervous/anxious.   All other systems reviewed and are negative.    PHYSICAL EXAM:  VS:  BP (!) 156/90  Pulse 77    Ht 6\' 3"  (1.905 m)    Wt 274 lb (124.3 kg)    BMI 34.25 kg/m  BMI: Body mass index is 34.25 kg/m.  Physical Exam  Constitutional: He is oriented to person, place, and time. He appears well-developed and well-nourished.  HENT:  Head: Normocephalic and atraumatic.  Eyes: Right eye exhibits no discharge. Left eye exhibits no discharge.  Neck: Normal range of motion. No JVD present.  Cardiovascular: Normal rate, regular rhythm, S1 normal, S2 normal and normal heart sounds. Exam reveals no distant heart sounds, no friction rub, no midsystolic click and no opening snap.  No murmur heard. Pulses:      Posterior tibial pulses are 2+ on the right side and 2+ on the left side.  Pulmonary/Chest: Effort normal and breath sounds normal. No respiratory distress. He has no decreased  breath sounds. He has no wheezes. He has no rales. He exhibits no tenderness.  Abdominal: Soft. He exhibits no distension. There is no abdominal tenderness.  Musculoskeletal:        General: No edema.  Neurological: He is alert and oriented to person, place, and time.  Skin: Skin is warm and dry. No cyanosis. Nails show no clubbing.  Psychiatric: He has a normal mood and affect. His speech is normal and behavior is normal. Judgment and thought content normal.     EKG:  Was ordered and interpreted by me today. Shows NSR, 77 bpm, occasional PVCs, nonspecific ST-T changes  Recent Labs: 01/25/2018: TSH 1.32 08/10/2018: ALT 16; BUN 12; Creat 1.04; Potassium 4.0; Sodium 138 09/10/2018: Hemoglobin 14.5; Platelets 240  08/10/2018: Cholesterol 62; HDL 31; LDL Cholesterol (Calc) 15; Total CHOL/HDL Ratio 2.0; Triglycerides 78   CrCl cannot be calculated (Patient's most recent lab result is older than the maximum 21 days allowed.).   Wt Readings from Last 3 Encounters:  09/10/18 274 lb (124.3 kg)  07/27/18 259 lb (117.5 kg)  06/02/18 269 lb 3.2 oz (122.1 kg)     Other studies reviewed: Additional studies/records reviewed today include: summarized above  ASSESSMENT AND PLAN:  1. Palpitations/PVCs: Patient has been experiencing a 3-week history of increased palpitations described as a "heart pounding" sensation.  Twelve-lead EKG today demonstrates occasional isolated PVCs.  We will begin his work-up by placing a 3-day ZIO monitor to quantify PVC burden.  Obtain Lexiscan Myoview to evaluate for high risk ischemia.  Schedule echocardiogram to evaluate for any structural abnormalities and EF.  Check TSH, BMP, magnesium, and CBC.  The patient notes a long history of multiple loose stools each morning and was previously taking magnesium repletion therapy though is not currently.  If magnesium is found to be subtherapeutic would recommend addition of magnesium oxide.  Most recent potassium was at goal as  outlined above.  Start Toprol-XL 12.5 mg daily and escalate as needed.  2. CAD involving the native coronary arteries without angina and stable shortness of breath: He is doing well without any symptoms concerning for chest pain.  Continue aspirin, Crestor, and Repatha.  Add Toprol-XL as above.  Aggressive risk factor modification and secondary prevention.  Schedule Lexiscan Myoview to evaluate for high risk ischemia given PVCs noted on twelve-lead EKG today.  Schedule echocardiogram given PVCs noted and shortness of breath.  3. Hypertension: Blood pressure is suboptimally controlled in the office today.  Add Toprol-XL as above.  Consolidate losartan from 50 mg 2 tabs daily to 100 mg daily.  If his blood pressure remains suboptimally controlled would  recommend addition of calcium channel blocker +/-renal artery ultrasound to evaluate for renal artery stenosis.  4. Hyperlipidemia: LDL of 15 from 07/2018.  Remains on Crestor and Repatha.  5. Obesity: Weight loss advised.  6. OSA: Compliance with CPAP recommended.  Disposition: F/u with Dr. Rockey Situ or an APP in 2 months.  Current medicines are reviewed at length with the patient today.  The patient did not have any concerns regarding medicines.  Signed, Christell Faith, PA-C 09/10/2018 1:37 PM     [address] [place], [place] [address] [phone]

## 2018-09-10 NOTE — Patient Instructions (Signed)
Medication Instructions:  Your physician has recommended you make the following change in your medication:  1- Losartan CHANGE to 1 tab (100 mg total) once daily 2- START Toprol take 0.5 tablet (12.5 mg total) once daily  If you need a refill on your cardiac medications before your next appointment, please call your pharmacy.   Lab work: Your physician recommends that you return for lab work today at the medical mall. No appt is needed. Hours are M-F 7AM- 6 PM.  If you have labs (blood work) drawn today and your tests are completely normal, you will receive your results only by: Marland Kitchen MyChart Message (if you have MyChart) OR . A paper copy in the mail If you have any lab test that is abnormal or we need to change your treatment, we will call you to review the results.  Testing/Procedures: 1-  Zio XT: We will place order and you will receive it in the mail.  You may get a call from [facility] @ either  [phone] Or  [phone] for them to confirm your address before it will be sent to you.  You will wear the monitor for 14 days, remove it and send it back to the company. They will send [place] a report. Then we will call you with the results. 2- Echo  Please return to [facility] on ______________ at _______________ AM/PM for an Echocardiogram. Your physician has requested that you have an echocardiogram. Echocardiography is a painless test that uses sound waves to create images of your heart. It provides your doctor with information about the size and shape of your heart and how well your heart's chambers and valves are working. This procedure takes approximately one hour. There are no restrictions for this procedure. Please note; depending on visual quality an IV may need to be placed.  3- [place]  Your caregiver has ordered a Stress Test with nuclear imaging. The purpose of this test is to evaluate the blood supply to your heart muscle. This procedure is referred to as a  "Non-Invasive Stress Test." This is because other than having an IV started in your vein, nothing is inserted or "invades" your body. Cardiac stress tests are done to find areas of poor blood flow to the heart by determining the extent of coronary artery disease (CAD). Some patients exercise on a treadmill, which naturally increases the blood flow to your heart, while others who are  unable to walk on a treadmill due to physical limitations have a pharmacologic/chemical stress agent called Lexiscan . This medicine will mimic walking on a treadmill by temporarily increasing your coronary blood flow.   Please note: these test may take anywhere between 2-4 hours to complete  PLEASE REPORT TO [facility] AT THE FIRST DESK WILL DIRECT YOU WHERE TO GO  Date of Procedure:_____________________________________  Arrival Time for Procedure:______________________________  Instructions regarding medication:    ___toprol_:  Hold betablocker(s) night before procedure and morning of procedure  ___ PLEASE NOTIFY THE OFFICE AT LEAST 24 HOURS IN ADVANCE IF YOU ARE UNABLE TO KEEP YOUR APPOINTMENT.  [phone] AND  PLEASE NOTIFY NUCLEAR MEDICINE AT [facility] AT LEAST 24 HOURS IN ADVANCE IF YOU ARE UNABLE TO KEEP YOUR APPOINTMENT. [phone]  How to prepare for your Myoview test:  1. Do not eat or drink after midnight 2. No caffeine for 24 hours prior to test 3. No smoking 24 hours prior to test. 4. Your medication may be taken with  water.  If your doctor stopped a medication because of this test, do not take that medication. 5. Ladies, please do not wear dresses.  Skirts or pants are appropriate. Please wear a short sleeve shirt. 6. No perfume, cologne or lotion. 7. Wear comfortable walking shoes. No heels!   Follow-Up: At [facility], you and your health needs are our priority.  As part of our continuing mission to provide you with exceptional heart care, we have created  designated Provider Care Teams.  These Care Teams include your primary Cardiologist (physician) and Advanced Practice Providers (APPs -  Physician Assistants and Nurse Practitioners) who all work together to provide you with the care you need, when you need it. You will need a follow up appointment in 2 months.   You may see Dr. Rockey Situ or Christell Faith, PA-C.

## 2018-09-15 ENCOUNTER — Other Ambulatory Visit: Payer: Self-pay

## 2018-09-15 ENCOUNTER — Encounter
Admission: RE | Admit: 2018-09-15 | Discharge: 2018-09-15 | Disposition: A | Discharge status: Home / Self Care | Payer: BC Managed Care – PPO | Source: Ambulatory Visit | Attending: Physician Assistant | Admitting: Physician Assistant

## 2018-09-15 DIAGNOSIS — R0602 Shortness of breath: Secondary | ICD-10-CM | POA: Diagnosis not present

## 2018-09-15 LAB — NM MYOCAR MULTI W/SPECT W/WALL MOTION / EF
LVDIAVOL: 177 mL (ref 62–150)
LVSYSVOL: 84 mL
NUC STRESS TID: 1.13

## 2018-09-15 MED ORDER — TECHNETIUM TC 99M TETROFOSMIN IV KIT
10.0000 | PACK | Freq: Once | INTRAVENOUS | Status: AC | PRN
  Administered 2018-09-15: 10.6 via INTRAVENOUS

## 2018-09-15 MED ORDER — REGADENOSON 0.4 MG/5ML IV SOLN
0.4000 mg | Freq: Once | INTRAVENOUS | Status: AC
  Administered 2018-09-15: 0.4 mg via INTRAVENOUS
  Filled 2018-09-15: qty 5

## 2018-09-15 MED ORDER — TECHNETIUM TC 99M TETROFOSMIN IV KIT
30.0000 | PACK | Freq: Once | INTRAVENOUS | Status: AC | PRN
  Administered 2018-09-15: 31.08 via INTRAVENOUS

## 2018-09-16 ENCOUNTER — Telehealth: Payer: Self-pay | Admitting: Physician Assistant

## 2018-09-16 ENCOUNTER — Telehealth: Payer: Self-pay | Admitting: Cardiovascular Disease

## 2018-09-16 LAB — NM MYOCAR MULTI W/SPECT W/WALL MOTION / EF
LV dias vol: 177 mL (ref 62–150)
LV sys vol: 84 mL
Peak HR: 109 {beats}/min
Percent HR: 68 %
Rest HR: 83 {beats}/min
TID: 1.13

## 2018-09-16 NOTE — Telephone Encounter (Signed)
Spoke with patient, discussed stress test. Continue with echo and planned follow up.

## 2018-09-16 NOTE — Telephone Encounter (Signed)
Spoke with patient and he verbalized understanding of the myoview stress test results. We scheduled him for follow up appointment the week after his echo. He was appreciative.

## 2018-09-16 NOTE — Telephone Encounter (Signed)
Please call patient with results from stress test.   Thank you

## 2018-09-21 ENCOUNTER — Other Ambulatory Visit: Payer: Self-pay

## 2018-09-21 ENCOUNTER — Other Ambulatory Visit: Payer: Self-pay | Admitting: *Deleted

## 2018-09-21 DIAGNOSIS — R002 Palpitations: Secondary | ICD-10-CM

## 2018-09-27 ENCOUNTER — Telehealth: Payer: Self-pay

## 2018-09-27 NOTE — Telephone Encounter (Signed)
PA started through [place] (Key: [id number]) Repatha SureClick 140MG /ML [place] SOAJ  Wait for Questions [employer] typically responds with questions in less than 15 minutes, but may take up to 24 hours.

## 2018-09-27 NOTE — Telephone Encounter (Signed)

## 2018-09-27 NOTE — Telephone Encounter (Signed)
° ° °  COVID-19 Pre-Screening Questions:   In the past 7 to 10 days have you had a cough,  shortness of breath, headache, congestion, fever (100 or greater) body aches, chills, sore throat, or sudden loss of taste or sense of smell? NO  Have you been around anyone with known Covid 19.  Have you been around anyone who is awaiting Covid 19 test results in the past 7 to 10 days?  Have you been around anyone who has been exposed to Covid 19, or has mentioned symptoms of Covid 19 within the past 7 to 10 days?  If you have any concerns/questions about symptoms patients report during screening (either on the phone or at [facility]). Contact the provider seeing the patient or DOD for further guidance.  If neither are available contact a member of the leadership team.

## 2018-09-28 ENCOUNTER — Ambulatory Visit (INDEPENDENT_AMBULATORY_CARE_PROVIDER_SITE_OTHER): Payer: BC Managed Care – PPO

## 2018-09-28 ENCOUNTER — Other Ambulatory Visit: Payer: Self-pay

## 2018-09-28 DIAGNOSIS — R0602 Shortness of breath: Secondary | ICD-10-CM | POA: Diagnosis not present

## 2018-09-28 NOTE — Telephone Encounter (Signed)
Message from Plan  Your PA has been resolved, no additional PA is required. For further inquiries please contact the number on the back of the member prescription card. (Message [phone])

## 2018-09-29 ENCOUNTER — Telehealth: Payer: Self-pay

## 2018-09-29 NOTE — Telephone Encounter (Signed)
With reassuring nuclear stress test and echocardiogram, as long as he continues to feel well, we can postpone this follow-up next week.  We will await the results of his outpatient cardiac monitoring and contact him with these.  As long as he continues to feel well we should plan to see him back in 6 months, sooner if needed.

## 2018-09-29 NOTE — Telephone Encounter (Signed)
-----   Message from Rise Mu, PA-C sent at 09/28/2018  5:58 PM EDT ----- Echo showed normal pump function, normal relaxation of the heart, normal wall motion, mildly enlarged left and right atria, tricuspid aortic valve without evidence of stenosis, mildly leaky mitral valve, normal in size and structure aortic root.  Overall, this is a reassuring echo and a mildly reduced pump function noted on his stress test was falsely low secondary to artifact.

## 2018-09-29 NOTE — Telephone Encounter (Signed)
Pt made aware of echo results with verbal understanding. Pt sts that he is doing well from cardiac standpoint and wants to know if he needs to kepp his 10/04/18 appt with Christell Faith, PA. Adv the pt that I will fwd the message to [facility] and call back with his response.

## 2018-09-29 NOTE — Telephone Encounter (Signed)
Spoke with the pt and made him aware of Ryan's response. appt for 10/04/18 cancelled. Adv the pt that we will contact him with his monitor results. Recall placed for 6 mo f/u. Pt adv to the contact the office sooner if needed.

## 2018-10-04 ENCOUNTER — Ambulatory Visit: Payer: BC Managed Care – PPO | Admitting: Physician Assistant

## 2018-10-04 NOTE — Telephone Encounter (Signed)
Patient states his pharmacy is still awaiting information regarding PA for Repatha. Pt needs a shot this Wednesday.

## 2018-10-06 ENCOUNTER — Telehealth: Payer: Self-pay | Admitting: Cardiovascular Disease

## 2018-10-06 ENCOUNTER — Other Ambulatory Visit: Payer: Self-pay

## 2018-10-06 MED ORDER — PRALUENT 150 MG/ML ~~LOC~~ SOAJ: 150.0000 mg | SUBCUTANEOUS | 11 refills | Status: DC

## 2018-10-06 MED ORDER — REPATHA SURECLICK 140 MG/ML ~~LOC~~ SOAJ: 1.0000 "pen " | SUBCUTANEOUS | 6 refills | Status: DC

## 2018-10-06 NOTE — Telephone Encounter (Signed)
Resubmitted medication to Pharmacy.  Called pharmacy to make sure they received the prescription. They stated another PA would need to be completed.   Started PA for Repatha. Awaiting Clinical Questions.

## 2018-10-06 NOTE — Telephone Encounter (Signed)
Discontinued Repatha.  Called patient.  Made him aware that Praluent was ready for him to pick up. Cost was $30

## 2018-10-06 NOTE — Telephone Encounter (Signed)
Prescription changed to Praluent and for some reason I am not able to discontinue the Repatha. Patient can start Praluent and stop the Repatha. Will route information for completion of PA.

## 2018-10-06 NOTE — Telephone Encounter (Signed)
Okay to change to equivalent dose.

## 2018-10-06 NOTE — Addendum Note (Signed)
Addended by: Valora Corporal on: 10/06/2018 02:28 PM   Modules accepted: Orders

## 2018-10-06 NOTE — Telephone Encounter (Signed)
PA was required for Repatha.  Upon completing patient PA a message stated  "The preferred product for your patient's health plan is Praluent."   Can the patient's treatment be switched to the preferred product?  Patient is currently out of this medication.

## 2018-10-06 NOTE — Telephone Encounter (Signed)
Patient calling in because previous cholesterol medication he had a "discounted card" and it calling to see if there is one available for his new medication, Praluent. Please advise

## 2018-10-06 NOTE — Telephone Encounter (Signed)
Patient really wants to know what to do about Repatha medication.  Patient needs to take shot today and has not heard from anyone about the status.  Please call to discuss.   Patient calling the office for samples of medication:   1.  What medication and dosage are you requesting samples for? Repatha   2.  Are you currently out of this medication? Yes, needs shot today

## 2018-10-07 NOTE — Telephone Encounter (Signed)
I called and spoke with the card. I advised I have no co-pay cards in the office. I did find a patient co-pay enrollment on-line.   The patient states he has gone through this process and tried to use the card at [employer]. He has been back and forth with the pharmacy and [facility]. He states he did buy the first month and has done some checking around. He states he spoke with [facility] and they have not had trouble with the card. He will try this with Kristopher Oppenheim next month and call back if needed.   He was appreciative for the call back.

## 2018-10-13 ENCOUNTER — Telehealth: Payer: Self-pay | Admitting: *Deleted

## 2018-10-13 MED ORDER — METOPROLOL SUCCINATE ER 25 MG PO TB24: 25.0000 mg | ORAL_TABLET | Freq: Every day | ORAL | 2 refills | Status: DC

## 2018-10-13 NOTE — Telephone Encounter (Signed)
-----   Message from Rise Mu, PA-C sent at 10/11/2018 10:58 AM EDT ----- Heart monitor showed NSR with an average heart rate of 72 bpm. 2 runs of SVT/atrial tachycardia were noted with the fastest interval lasting 10.8 seconds with a maximum rate of 174 bpm (average rate of 149 bpm). This was also the longest interval. Isolated PACs were occasional. Atrial couplets and triplets were rare. Isolated PVCs were occasional with ventricular couplets being rare. Ventricular trigeminy was noted.   -Recent Myoview without evidence of ischemia and echo with normal LV systolic function.  -Please increase Toprol XL to 25 mg daily.  -We should see him back in a few months to ensure he is still feeling well.

## 2018-10-13 NOTE — Telephone Encounter (Signed)
Results released to My Chart. Results called to pt. Pt verbalized understanding. States since he's started the metoprolol he feels much better.  Patient aware to call back in a few weeks to schedule an appointment once schedule is out.  Med list updated.

## 2018-10-18 ENCOUNTER — Telehealth: Payer: Self-pay | Admitting: Pharmacist

## 2018-10-18 NOTE — Telephone Encounter (Signed)
PA for Praluent good through 10/16/2019

## 2018-12-13 NOTE — Progress Notes (Signed)
Patient ID: Dean Sullivan, male   DOB: Aug 05, 1955, 63 y.o.   MRN: [id number] Cardiology Office Note  Date:  12/14/2018   ID:  Dean Sullivan, DOB 09-03-1955, MRN [id number]  PCP:  [facility]   Chief Complaint  Patient presents with  . Other    2 month follow up. Patient c/o SOB and getting tired quickly. Meds reviewed verbally with patient.     HPI:  [name] is a pleasant 63 year old gentleman with history of  coronary artery disease,   prior cardiac catheterization showing obstructive disease in his RCA and LAD, stent placed to each vessel  hyperlipidemia,  obstructive sleep apnea on CPAP,  presenting for Routine follow-up of his coronary artery disease   Stomach upset, Causing palpitations GERD Feel heart beat in stomach Tired in the AM, then better in the later part of the day  Low testosterone, had pellets  Echo 09/2018, is also reviewed with him in detail  1. The left ventricle has normal systolic function, with an ejection fraction of 55-60%. The cavity size was normal. Left ventricular diastolic parameters were normal. No evidence of left ventricular regional wall motion abnormalities.  2. The right ventricle has normal systolic function. The cavity was normal. There is no increase in right ventricular wall thickness. Right ventricular systolic pressure could not be assessed.  3. Left atrial size was mildly dilated.  4. Right atrial size was mildly dilated.  No  Chest pain, BP elevated today, better at home Having Ed problems  On crestor and repatha Total chol less than 100  Denies any anginal symptoms, no regular exercise program No shortness of breath on exertion  Unclear if he has sleep issues, waking up very tired,  Has not had his CPAP checked and 5 years or more  EKG personally reviewed by myself on todays visit Shows normal sinus rhythm rate 72 bpm nonspecific T wave abnormality  Other past medical history reviewed Continues to  take Crestor 5 mg daily. He had myalgias on 20 mg Did not tolerate Zetia Previously had side effects on WelChol On repatha  Family hx of prostate cancer  Reports having 2 stents placed either in 2012 or 2013 at hospital in [place].   PMH:   has a past medical history of Allergy, Coronary arteriosclerosis in native artery, GERD (gastroesophageal reflux disease), Glaucoma, Hearing loss of right ear, History of kidney stones, History of trigger finger, Hyperlipidemia, Hypertension, Irregular heart beat, Sleep apnea, and Type 2 diabetes mellitus ([place]) (03/18/2017).  PSH:    Past Surgical History:  Procedure Laterality Date  . CARDIAC CATHETERIZATION     x2 stents [place]  2012   2  . COLONOSCOPY WITH PROPOFOL N/A 04/21/2017   Procedure: COLONOSCOPY WITH PROPOFOL;  Surgeon: Jonathon Bellows, MD;  [facility] ENDOSCOPY;  Service: Gastroenterology;  Laterality: N/A;  . LIPOMA EXCISION Left 03/09/2017   Procedure: EXCISION LIPOMA;  Surgeon: Earnestine Leys, MD;  Location: [facility] ORS;  Service: Orthopedics;  Laterality: Left;  . PROSTATE BIOPSY  03/03/2018  . RESECTION DISTAL CLAVICAL Left 03/09/2017   Procedure: RESECTION DISTAL CLAVICAL;  Surgeon: Earnestine Leys, MD;  Location: [facility] ORS;  Service: Orthopedics;  Laterality: Left;  . SHOULDER ARTHROSCOPY WITH OPEN ROTATOR CUFF REPAIR Left 03/09/2017   Procedure: SHOULDER ARTHROSCOPY WITH OPEN ROTATOR CUFF REPAIR;  Surgeon: Earnestine Leys, MD;  Location: [facility] ORS;  Service: Orthopedics;  Laterality: Left;  . SUBACROMIAL DECOMPRESSION Left 03/09/2017   Procedure: SUBACROMIAL DECOMPRESSION;  Surgeon: Earnestine Leys, MD;  Location: [facility] ORS;  Service: Orthopedics;  Laterality: Left;  BICEPS TENOTOMY    Current Outpatient Medications  Medication Sig Dispense Refill  . Alirocumab (PRALUENT) 150 MG/ML SOAJ Inject 150 mg into the skin every 14 (fourteen) days. 2 pen 11  . aspirin 81 MG tablet Take 81 mg by mouth  daily.    . cetirizine (ZYRTEC) 10 MG tablet Take 10 mg by mouth daily.    . dorzolamide (TRUSOPT) 2 % ophthalmic solution Apply 1 drop to eye 3 (three) times daily.    Marland Kitchen dutasteride (AVODART) 0.5 MG capsule Take 0.5 mg by mouth daily.    Marland Kitchen latanoprost (XALATAN) 0.005 % ophthalmic solution Place 1 drop into both eyes at bedtime.     Marland Kitchen losartan (COZAAR) 100 MG tablet Take 1 tablet (100 mg total) by mouth daily. 90 tablet 3  . metoprolol succinate (TOPROL XL) 25 MG 24 hr tablet Take 1 tablet (25 mg total) by mouth daily. 90 tablet 2  . rosuvastatin (CRESTOR) 20 MG tablet Take 10 mg by mouth daily.    Marland Kitchen VITAMIN D PO Take 5,000 mcg by mouth daily.     No current facility-administered medications for this visit.      Allergies:   Patient has no known allergies.   Social History:  The patient  reports that he has never smoked. He has never used smokeless tobacco. He reports current alcohol use. He reports that he does not use drugs.   Family History:   family history includes Alcohol abuse in his father; Arthritis in his maternal grandmother; Cancer in his father; Diabetes in his mother; Heart Problems in his father; Other in his maternal aunt; Stroke in his father.    Review of Systems: Review of Systems  Constitutional: Negative.   Respiratory: Negative.   Cardiovascular: Negative.   Gastrointestinal: Negative.   Musculoskeletal: Positive for joint pain.  Neurological: Negative.   Psychiatric/Behavioral: Negative.   All other systems reviewed and are negative.    PHYSICAL EXAM: VS:  BP 130/78 (BP Location: Left Arm, Patient Position: Sitting, Cuff Size: Large)   Pulse 72   Ht 6' 3.5" (1.918 m)   Wt 274 lb (124.3 kg)   BMI 33.80 kg/m  , BMI Body mass index is 33.8 kg/m. Constitutional:  oriented to person, place, and time. No distress.  Obese HENT:  Head: Grossly normal Eyes:  no discharge. No scleral icterus.  Neck: No JVD, no carotid bruits  Cardiovascular: Regular rate  and rhythm, no murmurs appreciated Pulmonary/Chest: Clear to auscultation bilaterally, no wheezes or rails Abdominal: Soft.  no distension.  no tenderness.  Musculoskeletal: Normal range of motion Neurological:  normal muscle tone. Coordination normal. No atrophy Skin: Skin warm and dry Psychiatric: normal affect, pleasant   Recent Labs: 08/10/2018: ALT 16 09/10/2018: BUN 10; Creatinine, Ser 1.02; Hemoglobin 14.5; Magnesium 2.2; Platelets 240; Potassium 3.6; Sodium 139; TSH 1.343    Lipid Panel Lab Results  Component Value Date   CHOL 62 08/10/2018   HDL 31 (L) 08/10/2018   LDLCALC 15 08/10/2018   TRIG 78 08/10/2018      Wt Readings from Last 3 Encounters:  12/14/18 274 lb (124.3 kg)  09/10/18 274 lb (124.3 kg)  07/27/18 259 lb (117.5 kg)       ASSESSMENT AND PLAN:  Coronary artery disease involving native coronary artery of native heart without angina pectoris - Stable, no further testing ordered Cholesterol at goal  Hypertension goal BP (blood  pressure) < 140/90 Blood pressure relatively well controlled on today's visit, no changes made  Hyperlipidemia LDL goal <70 On repatha and Crestor 5 mg daily Numbers are remarkably low  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 We have offered to do this but he prefers to discuss this with Dr. Sanda Klein   Total encounter time more than 25 minutes  Greater than 50% was spent in  counseling and coordination of care with the patient  Disposition:   F/U  12 months   No orders of the defined types were placed in this encounter.    Signed, Esmond Plants, M.D., Ph.D. 12/14/2018  [place], [place]

## 2018-12-14 ENCOUNTER — Other Ambulatory Visit: Payer: Self-pay

## 2018-12-14 ENCOUNTER — Encounter: Payer: Self-pay | Admitting: Cardiovascular Disease

## 2018-12-14 ENCOUNTER — Ambulatory Visit: Payer: BC Managed Care – PPO | Admitting: Cardiovascular Disease

## 2018-12-14 VITALS — BP 130/78 | HR 72 | Ht 75.5 in | Wt 274.0 lb

## 2018-12-14 DIAGNOSIS — E785 Hyperlipidemia, unspecified: Secondary | ICD-10-CM

## 2018-12-14 DIAGNOSIS — I25118 Atherosclerotic heart disease of native coronary artery with other forms of angina pectoris: Secondary | ICD-10-CM

## 2018-12-14 DIAGNOSIS — I10 Essential (primary) hypertension: Secondary | ICD-10-CM | POA: Diagnosis not present

## 2018-12-14 DIAGNOSIS — R0602 Shortness of breath: Secondary | ICD-10-CM

## 2018-12-14 DIAGNOSIS — E1159 Type 2 diabetes mellitus with other circulatory complications: Secondary | ICD-10-CM | POA: Diagnosis not present

## 2018-12-14 NOTE — Patient Instructions (Signed)
Referral to pulmonary for OSA and CPAP adjustment   Medication Instructions:  No changes  If you need a refill on your cardiac medications before your next appointment, please call your pharmacy.    Lab work: No new labs needed   If you have labs (blood work) drawn today and your tests are completely normal, you will receive your results only by: Marland Kitchen MyChart Message (if you have MyChart) OR . A paper copy in the mail If you have any lab test that is abnormal or we need to change your treatment, we will call you to review the results.   Testing/Procedures: No new testing needed   Follow-Up: At [facility], you and your health needs are our priority.  As part of our continuing mission to provide you with exceptional heart care, we have created designated Provider Care Teams.  These Care Teams include your primary Cardiologist (physician) and Advanced Practice Providers (APPs -  Physician Assistants and Nurse Practitioners) who all work together to provide you with the care you need, when you need it.  . You will need a follow up appointment in 12 months .   Please call our office 2 months in advance to schedule this appointment.    . Providers on your designated Care Team:   . Murray Hodgkins, NP . Christell Faith, PA-C . Marrianne Mood, PA-C  Any Other Special Instructions Will Be Listed Below (If Applicable).  For educational health videos Log in to : [email or web address] Or : [email or web address], password : [email or web address]

## 2019-05-03 ENCOUNTER — Other Ambulatory Visit: Payer: Self-pay | Admitting: Urology

## 2019-05-03 DIAGNOSIS — R310 Gross hematuria: Secondary | ICD-10-CM

## 2019-05-12 ENCOUNTER — Other Ambulatory Visit: Payer: Self-pay | Admitting: Cardiovascular Disease

## 2019-05-12 NOTE — Telephone Encounter (Signed)
Called patient to verify how patient was taking Crestor.  Patient confirmed Crestor 20MG  -- take 1/2 tablet daily.

## 2019-05-18 ENCOUNTER — Other Ambulatory Visit: Payer: Self-pay

## 2019-05-18 ENCOUNTER — Ambulatory Visit
Admission: RE | Admit: 2019-05-18 | Discharge: 2019-05-18 | Disposition: A | Discharge status: Home / Self Care | Payer: BC Managed Care – PPO | Source: Ambulatory Visit | Attending: Urology | Admitting: Urology

## 2019-05-18 DIAGNOSIS — R310 Gross hematuria: Secondary | ICD-10-CM

## 2019-05-18 MED ORDER — IOHEXOL 300 MG/ML  SOLN
125.0000 mL | Freq: Once | INTRAMUSCULAR | Status: AC | PRN
  Administered 2019-05-18: 08:00:00 125 mL via INTRAVENOUS

## 2019-08-10 ENCOUNTER — Other Ambulatory Visit: Payer: Self-pay | Admitting: Physician Assistant

## 2019-08-19 ENCOUNTER — Telehealth: Payer: Self-pay | Admitting: Cardiovascular Disease

## 2019-08-19 NOTE — Telephone Encounter (Signed)
Spoke with patient and he did report being outside yesterday for extended amount of time in the heat and had only ate breakfast and no additional fluids. Blood pressure today was 122/70. He reports that when he came in from outside that he did eat, drink, and took a nap which made him feel much better. Reviewed the importance of hydration during this heat and making sure not to miss meals. He also reported eating some bad pork over last weekend and was sick from that as well. Again instructed him to take hydrate and make sure to eat. Let him know if his symptoms persist he can also reach out to his PCP for further evaluation as well. He verbalized understanding of our conversation and had no further questions at this time.

## 2019-08-19 NOTE — Telephone Encounter (Signed)
Patient calling in concerned about some health issues. Patient seems to have more fatigue lately than usual even in minor activiites. Patient also is having some HR issues, more increased lately. Patient states he might have had food poisoning last Sunday but he is still concerned at this point

## 2019-08-29 ENCOUNTER — Other Ambulatory Visit: Payer: Self-pay | Admitting: Physician Assistant

## 2019-09-20 ENCOUNTER — Telehealth: Payer: Self-pay | Admitting: Cardiovascular Disease

## 2019-09-20 NOTE — Telephone Encounter (Signed)
Patient wants chart note to reflect he called today.    He does not want [facility] use [facility] as he has had past negative experiences.

## 2019-11-01 ENCOUNTER — Other Ambulatory Visit: Payer: Self-pay | Admitting: Cardiovascular Disease

## 2019-11-07 ENCOUNTER — Other Ambulatory Visit: Payer: Self-pay | Admitting: Physician Assistant

## 2019-11-30 ENCOUNTER — Telehealth: Payer: Self-pay | Admitting: Cardiovascular Disease

## 2019-11-30 NOTE — Telephone Encounter (Signed)
Patient has spoken with company that has the recall and .He will continue to use until it is replaced.He also spoke with pcp regarding this.

## 2019-11-30 NOTE — Telephone Encounter (Signed)
Patient calling in stating that his CPAP machine has been recalled. Patient states they are going to take some time to figure out if it will be fixed or replaced. Patient would like t be advised whether he should continue to use it or stop for the interim.   Please advise

## 2019-12-18 ENCOUNTER — Other Ambulatory Visit: Payer: Self-pay | Admitting: Physician Assistant

## 2019-12-19 ENCOUNTER — Other Ambulatory Visit: Payer: Self-pay

## 2019-12-19 ENCOUNTER — Encounter: Payer: Self-pay | Admitting: Cardiovascular Disease

## 2019-12-19 ENCOUNTER — Ambulatory Visit: Payer: BC Managed Care – PPO | Admitting: Cardiovascular Disease

## 2019-12-19 VITALS — BP 140/76 | HR 83 | Ht 75.5 in | Wt 278.0 lb

## 2019-12-19 DIAGNOSIS — E1169 Type 2 diabetes mellitus with other specified complication: Secondary | ICD-10-CM | POA: Diagnosis not present

## 2019-12-19 DIAGNOSIS — E785 Hyperlipidemia, unspecified: Secondary | ICD-10-CM

## 2019-12-19 DIAGNOSIS — I25118 Atherosclerotic heart disease of native coronary artery with other forms of angina pectoris: Secondary | ICD-10-CM

## 2019-12-19 DIAGNOSIS — E669 Obesity, unspecified: Secondary | ICD-10-CM

## 2019-12-19 NOTE — Progress Notes (Signed)
Patient ID: Dean Sullivan, male   DOB: 07-Dec-1955, 64 y.o.   MRN: [id number] Cardiology Office Note  Date:  12/19/2019   ID:  Dean Sullivan, DOB 10-Jan-1956, MRN [id number]  PCP:  [facility] [place]   Chief Complaint  Patient presents with  . Other    12 month follow up.  Meds reviewed verbally with patient.     HPI:  Dean Sullivan is a pleasant 64 year old gentleman with history of  coronary artery disease,   prior cardiac catheterization showing obstructive disease in his RCA and LAD, stent placed to each vessel  hyperlipidemia,  obstructive sleep apnea on CPAP,  presenting for Routine follow-up of his coronary artery disease   Feels well Low testosterone, had pellets, changed to pill x2 twice a day No chest pain, SOB   Lab Results  Component Value Date   CHOL 62 08/10/2018   HDL 31 (L) 08/10/2018   LDLCALC 15 08/10/2018   TRIG 78 08/10/2018    Echo 09/2018, reviewed on today's visit  1. The left ventricle has normal systolic function, with an ejection fraction of 55-60%. The cavity size was normal. Left ventricular diastolic parameters were normal. No evidence of left ventricular regional wall motion abnormalities.  2. The right ventricle has normal systolic function. The cavity was normal. There is no increase in right ventricular wall thickness. Right ventricular systolic pressure could not be assessed.  3. Left atrial size was mildly dilated.  4. Right atrial size was mildly dilated.   On crestor and repatha Total chol less than 100 last year  Compliant with his CPAP  EKG personally reviewed by myself on todays visit Shows normal sinus rhythm rate 83 bpm nonspecific T wave abnormality Single PVC  Other past medical history reviewed Continues to take Crestor 5 mg daily. He had myalgias on 20 mg Did not tolerate Zetia Previously had side effects on WelChol On repatha  Family hx of prostate cancer  Reports having 2 stents placed either in 2012 or  2013 at hospital in [place].   PMH:   has a past medical history of Allergy, Coronary arteriosclerosis in native artery, GERD (gastroesophageal reflux disease), Glaucoma, Hearing loss of right ear, History of kidney stones, History of trigger finger, Hyperlipidemia, Hypertension, Irregular heart beat, Sleep apnea, and Type 2 diabetes mellitus ([place]) (03/18/2017).  PSH:    Past Surgical History:  Procedure Laterality Date  . CARDIAC CATHETERIZATION     x2 stents [place]  2012   2  . COLONOSCOPY WITH PROPOFOL N/A 04/21/2017   Procedure: COLONOSCOPY WITH PROPOFOL;  Surgeon: Jonathon Bellows, MD;  Location: [facility] ENDOSCOPY;  Service: Gastroenterology;  Laterality: N/A;  . LIPOMA EXCISION Left 03/09/2017   Procedure: EXCISION LIPOMA;  Surgeon: Earnestine Leys, MD;  Location: [facility] ORS;  Service: Orthopedics;  Laterality: Left;  . PROSTATE BIOPSY  03/03/2018  . RESECTION DISTAL CLAVICAL Left 03/09/2017   Procedure: RESECTION DISTAL CLAVICAL;  Surgeon: Earnestine Leys, MD;  Location: [facility] ORS;  Service: Orthopedics;  Laterality: Left;  . SHOULDER ARTHROSCOPY WITH OPEN ROTATOR CUFF REPAIR Left 03/09/2017   Procedure: SHOULDER ARTHROSCOPY WITH OPEN ROTATOR CUFF REPAIR;  Surgeon: Earnestine Leys, MD;  Location: [facility] ORS;  Service: Orthopedics;  Laterality: Left;  . SUBACROMIAL DECOMPRESSION Left 03/09/2017   Procedure: SUBACROMIAL DECOMPRESSION;  Surgeon: Earnestine Leys, MD;  Location: [facility] ORS;  Service: Orthopedics;  Laterality: Left;  BICEPS TENOTOMY    Current Outpatient Medications  Medication Sig Dispense Refill  . aspirin  81 MG tablet Take 81 mg by mouth daily.    . cetirizine (ZYRTEC) 10 MG tablet Take 10 mg by mouth daily.    Marland Kitchen dutasteride (AVODART) 0.5 MG capsule Take 0.5 mg by mouth daily.    Marland Kitchen latanoprost (XALATAN) 0.005 % ophthalmic solution Place 1 drop into both eyes at bedtime.     Marland Kitchen losartan (COZAAR) 100 MG tablet Take 1 tablet (100 mg total) by  mouth daily. PLEASE CALL OFFICE TO SCHEDULE AN APPOINTMENT FOR FURTHER REFILLS. 90 tablet 0  . metoprolol succinate (TOPROL-XL) 25 MG 24 hr tablet Take 1 tablet (25 mg total) by mouth daily. PLEASE CALL TO SCHEDULE AN APPOINTMENT FOR FURTHER REFILLS. 90 tablet 0  . PRALUENT 150 MG/ML SOAJ INJECT 1ML UNDER THE SKIN EVERY 14 DAYS 2 pen 2  . rosuvastatin (CRESTOR) 20 MG tablet Take 0.5 tablets (10 mg total) by mouth daily. Please schedule office visit for further refills. Thank you! 45 tablet 0  . VITAMIN D PO Take 5,000 mcg by mouth daily.     No current facility-administered medications for this visit.     Allergies:   Patient has no known allergies.   Social History:  The patient  reports that he has never smoked. He has never used smokeless tobacco. He reports current alcohol use. He reports that he does not use drugs.   Family History:   family history includes Alcohol abuse in his father; Arthritis in his maternal grandmother; Cancer in his father; Diabetes in his mother; Heart Problems in his father; Other in his maternal aunt; Stroke in his father.    Review of Systems: Review of Systems  Constitutional: Negative.   Respiratory: Negative.   Cardiovascular: Negative.   Gastrointestinal: Negative.   Musculoskeletal: Positive for joint pain.  Neurological: Negative.   Psychiatric/Behavioral: Negative.   All other systems reviewed and are negative.    PHYSICAL EXAM: VS:  BP 140/76 (BP Location: Left Arm, Patient Position: Sitting, Cuff Size: Normal)   Pulse 83   Ht 6' 3.5" (1.918 m)   Wt 278 lb (126.1 kg)   SpO2 98%   BMI 34.29 kg/m  , BMI Body mass index is 34.29 kg/m. Constitutional:  oriented to person, place, and time. No distress.  HENT:  Head: Grossly normal Eyes:  no discharge. No scleral icterus.  Neck: No JVD, no carotid bruits  Cardiovascular: Regular rate and rhythm, no murmurs appreciated Pulmonary/Chest: Clear to auscultation bilaterally, no wheezes or  rails Abdominal: Soft.  no distension.  no tenderness.  Musculoskeletal: Normal range of motion Neurological:  normal muscle tone. Coordination normal. No atrophy Skin: Skin warm and dry Psychiatric: normal affect, pleasant   Recent Labs: No results found for requested labs within last 8760 hours.    Lipid Panel Lab Results  Component Value Date   CHOL 62 08/10/2018   HDL 31 (L) 08/10/2018   LDLCALC 15 08/10/2018   TRIG 78 08/10/2018     Wt Readings from Last 3 Encounters:  12/19/19 278 lb (126.1 kg)  12/14/18 274 lb (124.3 kg)  09/10/18 274 lb (124.3 kg)      ASSESSMENT AND PLAN:  Coronary artery disease involving native coronary artery of native heart without angina pectoris - Currently with no symptoms of angina. No further workup at this time. Continue current medication regimen.  Hypertension goal BP (blood pressure) < 140/90 Blood pressure is well controlled on today's visit. No changes made to the medications.  Hyperlipidemia LDL goal <70 On repatha and  Crestor 5 mg daily Recheck lipids , order placed  Hypo-testosterone On pill, sees Dr. Rogers Blocker  Obesity, Class I, BMI 30.0-34.9 (see actual BMI) We have encouraged continued exercise, careful diet management in an effort to lose weight.  Obstructive sleep apnea on CPAP On CPAP, machine recalled  Erectile dysfunction Sees Dr. Rogers Blocker   Total encounter time more than 25 minutes  Greater than 50% was spent in counseling and coordination of care with the patient   No orders of the defined types were placed in this encounter.    Signed, Esmond Plants, M.D., Ph.D. 12/19/2019  [place], [place]

## 2019-12-19 NOTE — Patient Instructions (Addendum)
[facility] at [facility], Lipids, LFTs  Medication Instructions:  No changes  If you need a refill on your cardiac medications before your next appointment, please call your pharmacy.    Lab work: No new labs needed   If you have labs (blood work) drawn today and your tests are completely normal, you will receive your results only by: Marland Kitchen MyChart Message (if you have MyChart) OR . A paper copy in the mail If you have any lab test that is abnormal or we need to change your treatment, we will call you to review the results.   Testing/Procedures: No new testing needed   Follow-Up: At [facility], you and your health needs are our priority.  As part of our continuing mission to provide you with exceptional heart care, we have created designated Provider Care Teams.  These Care Teams include your primary Cardiologist (physician) and Advanced Practice Providers (APPs -  Physician Assistants and Nurse Practitioners) who all work together to provide you with the care you need, when you need it.  . You will need a follow up appointment in 12 months  . Providers on your designated Care Team:   . Dean Hodgkins, NP . Dean Faith, PA-C . Dean Mood, PA-C  Any Other Special Instructions Will Be Listed Below (If Applicable).  COVID-19 Vaccine Information can be found at: ShippingScam.co.uk For questions related to vaccine distribution or appointments, please email vaccine@[email or web address] .com or call [phone].

## 2019-12-19 NOTE — Telephone Encounter (Signed)
Please contact pt for 12 month f/u appointment.

## 2019-12-21 ENCOUNTER — Other Ambulatory Visit: Payer: Self-pay | Admitting: Cardiovascular Disease

## 2019-12-21 LAB — LIPID PANEL W/O CHOL/HDL RATIO
Cholesterol, Total: 66 mg/dL — ABNORMAL LOW (ref 100–199)
HDL: 28 mg/dL — ABNORMAL LOW (ref >39)
LDL Chol Calc (NIH): 19 mg/dL (ref 0–99)
Triglycerides: 95 mg/dL (ref 0–149)
VLDL Cholesterol Cal: 19 mg/dL (ref 5–40)

## 2019-12-21 LAB — HEPATIC FUNCTION PANEL
ALT: 32 IU/L (ref 0–44)
AST: 27 IU/L (ref 0–40)
Albumin: 4.7 g/dL (ref 3.8–4.8)
Alkaline Phosphatase: 68 IU/L (ref 44–121)
Bilirubin Total: 1.3 mg/dL — ABNORMAL HIGH (ref 0.0–1.2)
Bilirubin, Direct: 0.2 mg/dL (ref 0.00–0.40)
Total Protein: 7.2 g/dL (ref 6.0–8.5)

## 2019-12-21 MED ORDER — PRALUENT 150 MG/ML ~~LOC~~ SOAJ: SUBCUTANEOUS | 10 refills | Status: DC

## 2019-12-21 NOTE — Telephone Encounter (Signed)
°*  STAT* If patient is at the pharmacy, call can be transferred to refill team.   1. Which medications need to be refilled? (please list name of each medication and dose if known) Praluent 150 MG  2. Which pharmacy/location (including street and city if local pharmacy) is medication to be sent to? [facility]  3. Do they need a 30 day or 90 day supply?

## 2019-12-21 NOTE — Telephone Encounter (Signed)
Requested Prescriptions   Signed Prescriptions Disp Refills   Alirocumab (PRALUENT) 150 MG/ML SOAJ 2 mL 10    Sig: INJECT 1ML UNDER THE SKIN EVERY 14 DAYS    Authorizing Provider: Minna Merritts    Ordering User: Raelene Bott, Iracema Lanagan L

## 2020-01-23 ENCOUNTER — Other Ambulatory Visit: Payer: Self-pay | Admitting: Physician Assistant

## 2020-01-23 DIAGNOSIS — M5136 Other intervertebral disc degeneration, lumbar region: Secondary | ICD-10-CM | POA: Insufficient documentation

## 2020-01-23 DIAGNOSIS — M47816 Spondylosis without myelopathy or radiculopathy, lumbar region: Secondary | ICD-10-CM | POA: Insufficient documentation

## 2020-01-23 NOTE — Telephone Encounter (Signed)
Rx request sent to pharmacy.  

## 2020-02-02 ENCOUNTER — Telehealth: Payer: Self-pay

## 2020-02-02 MED ORDER — ROSUVASTATIN CALCIUM 20 MG PO TABS: 10.0000 mg | ORAL_TABLET | Freq: Every day | ORAL | 3 refills | Status: DC

## 2020-02-02 NOTE — Telephone Encounter (Signed)
Incoming [facility] from [facility] - requesting Rosuvastatin 20MG  - take 1/2 tablet my mouth daily.  Per patient chart patient is taking Rosuvastatin 20MG  1/2 tablet daily.   Per Dr. Gwenyth Ober last office note - 11/2019  "Hyperlipidemia LDL goal <70 On repatha and Crestor 5 mg daily Recheck lipids , order placed"  Labs looked good and he stated to continue current medication plan.   Called patient to verify how he taking Rosuvastatin 20MG .  Patient confirmed that he was taking Rosuvastatin 20MG  1/2 tablet daily.   I will refill with this dose and instructions.

## 2020-02-12 ENCOUNTER — Other Ambulatory Visit: Payer: Self-pay | Admitting: Physician Assistant

## 2020-05-04 ENCOUNTER — Other Ambulatory Visit: Payer: Self-pay | Admitting: Cardiovascular Disease

## 2020-10-18 ENCOUNTER — Ambulatory Visit: Payer: Self-pay | Admitting: *Deleted

## 2020-10-18 NOTE — Telephone Encounter (Signed)
If any virtual appointments come up or cancellations. Can we call this patient

## 2020-10-18 NOTE — Telephone Encounter (Signed)
Summary: covid advise   Pt tested positive for covid today with a home test / he has a cough and scratchy throat/ Pt first started having symptoms Saturday / pt wanted advise about what he can do from this point/please advise      Reason for Disposition  [1] HIGH RISK for severe COVID complications (e.g., weak immune system, age > 52 years, obesity with BMI > 25, pregnant, chronic lung disease or other chronic medical condition) AND [2] COVID symptoms (e.g., cough, fever)  (Exceptions: Already seen by PCP and no new or worsening symptoms.)  Answer Assessment - Initial Assessment Questions 1. COVID-19 DIAGNOSIS: "Who made your COVID-19 diagnosis?" "Was it confirmed by a positive lab test or self-test?" If not diagnosed by a doctor (or NP/PA), ask "Are there lots of cases (community spread) where you live?" Note: See public health department website, if unsure.     + home test today 2. COVID-19 EXPOSURE: "Was there any known exposure to COVID before the symptoms began?" CDC Definition of close contact: within 6 feet (2 meters) for a total of 15 minutes or more over a 24-hour period.      unknown 3. ONSET: "When did the COVID-19 symptoms start?"      Tuesday- fever, fatigue 4. WORST SYMPTOM: "What is your worst symptom?" (e.g., cough, fever, shortness of breath, muscle aches)     Sinus drainage 5. COUGH: "Do you have a cough?" If Yes, ask: "How bad is the cough?"       Only with sinus drainage- better now 6. FEVER: "Do you have a fever?" If Yes, ask: "What is your temperature, how was it measured, and when did it start?"     No fever 7. RESPIRATORY STATUS: "Describe your breathing?" (e.g., shortness of breath, wheezing, unable to speak)      Normal breathing 8. BETTER-SAME-WORSE: "Are you getting better, staying the same or getting worse compared to yesterday?"  If getting worse, ask, "In what way?"     Better- improvement in symptoms 9. HIGH RISK DISEASE: "Do you have any chronic medical  problems?" (e.g., asthma, heart or lung disease, weak immune system, obesity, etc.)     Heart disease, high cholesterol 10. VACCINE: "Have you had the COVID-19 vaccine?" If Yes, ask: "Which one, how many shots, when did you get it?"       Yes- Moderna 11. BOOSTER: "Have you received your COVID-19 booster?" If Yes, ask: "Which one and when did you get it?"       yes 12. PREGNANCY: "Is there any chance you are pregnant?" "When was your last menstrual period?"       N/a 13. OTHER SYMPTOMS: "Do you have any other symptoms?"  (e.g., chills, fatigue, headache, loss of smell or taste, muscle pain, sore throat)       Chills, headache, sinus pain- all better, sore throat 14. O2 SATURATION MONITOR:  "Do you use an oxygen saturation monitor (pulse oximeter) at home?" If Yes, ask "What is your reading (oxygen level) today?" "What is your usual oxygen saturation reading?" (e.g., 95%)       no  Protocols used: Coronavirus (COVID-19) Diagnosed or Suspected-A-AH

## 2020-10-18 NOTE — Telephone Encounter (Signed)
Patient is calling to report he had + COVID test today. Patient states he started symptoms Monday night- but he has improved greatly and only has sore throat and sinus drainage now. Patient advised per COVID protocol: treatment and isolation. Although patient is doing well- his is listed as moderate risk and call sent for provider review- he does have virtual visit scheduled- but not until Monday.

## 2020-10-21 ENCOUNTER — Other Ambulatory Visit: Payer: Self-pay | Admitting: Cardiovascular Disease

## 2020-10-22 ENCOUNTER — Telehealth (INDEPENDENT_AMBULATORY_CARE_PROVIDER_SITE_OTHER): Payer: Self-pay | Admitting: Family Medicine

## 2020-10-23 NOTE — Progress Notes (Signed)
Patient canceled appt prior to being seen.

## 2020-11-07 ENCOUNTER — Other Ambulatory Visit: Payer: Self-pay

## 2020-11-07 MED ORDER — LOSARTAN POTASSIUM 100 MG PO TABS: 100.0000 mg | ORAL_TABLET | Freq: Every day | ORAL | 0 refills | Status: DC

## 2020-11-13 ENCOUNTER — Other Ambulatory Visit: Payer: Self-pay | Admitting: Cardiovascular Disease

## 2020-11-14 NOTE — Telephone Encounter (Signed)
Please advise if ok to refill Praluent 150 mg/ml. Pt confirmed that he has been taking praluent for a while now. Medication not on pt's current medication list. Please advise if ok to refill.

## 2021-01-29 ENCOUNTER — Other Ambulatory Visit: Payer: Self-pay | Admitting: Cardiovascular Disease

## 2021-01-29 ENCOUNTER — Telehealth: Payer: Self-pay | Admitting: Cardiovascular Disease

## 2021-01-29 NOTE — Telephone Encounter (Signed)
rosuvastatin (CRESTOR) 20 MG tablet 45 tablet 0 01/29/2021    Sig: Take 1/2 (one-half) tablet by mouth once daily   Sent to pharmacy as: rosuvastatin (CRESTOR) 20 MG tablet   E-Prescribing Status: Sent to pharmacy (01/29/2021  3:03 PM EST)    Pharmacy  [facility], [place]

## 2021-01-29 NOTE — Telephone Encounter (Signed)
Patient needs an appt for refill, Thanks !

## 2021-01-29 NOTE — Telephone Encounter (Signed)
*  STAT* If patient is at the pharmacy, call can be transferred to refill team.   1. Which medications need to be refilled? (please list name of each medication and dose if known) Crestor 20mg  0.5 daily   2. Which pharmacy/location (including street and city if local pharmacy) is medication to be sent to? [facility], [place]   3. Do they need a 30 day or 90 day supply? 90 day

## 2021-01-30 ENCOUNTER — Other Ambulatory Visit: Payer: Self-pay | Admitting: Cardiovascular Disease

## 2021-01-30 MED ORDER — METOPROLOL SUCCINATE ER 25 MG PO TB24: 25.0000 mg | ORAL_TABLET | Freq: Every day | ORAL | 0 refills | Status: DC

## 2021-02-05 ENCOUNTER — Other Ambulatory Visit: Payer: Self-pay | Admitting: Physician Assistant

## 2021-02-13 ENCOUNTER — Encounter: Payer: Self-pay | Admitting: Cardiovascular Disease

## 2021-02-13 ENCOUNTER — Ambulatory Visit: Payer: BC Managed Care – PPO | Admitting: Cardiovascular Disease

## 2021-02-13 ENCOUNTER — Other Ambulatory Visit: Payer: Self-pay

## 2021-02-13 VITALS — BP 150/90 | HR 73 | Ht 75.0 in | Wt 259.4 lb

## 2021-02-13 DIAGNOSIS — E1169 Type 2 diabetes mellitus with other specified complication: Secondary | ICD-10-CM

## 2021-02-13 DIAGNOSIS — I25118 Atherosclerotic heart disease of native coronary artery with other forms of angina pectoris: Secondary | ICD-10-CM | POA: Diagnosis not present

## 2021-02-13 DIAGNOSIS — E785 Hyperlipidemia, unspecified: Secondary | ICD-10-CM | POA: Diagnosis not present

## 2021-02-13 DIAGNOSIS — E669 Obesity, unspecified: Secondary | ICD-10-CM

## 2021-02-13 MED ORDER — LOSARTAN POTASSIUM 100 MG PO TABS: 100.0000 mg | ORAL_TABLET | Freq: Every day | ORAL | 3 refills | Status: DC

## 2021-02-13 MED ORDER — AMLODIPINE BESYLATE 10 MG PO TABS: 10.0000 mg | ORAL_TABLET | Freq: Every day | ORAL | 3 refills | Status: DC

## 2021-02-13 MED ORDER — METOPROLOL SUCCINATE ER 25 MG PO TB24: 25.0000 mg | ORAL_TABLET | Freq: Every day | ORAL | 3 refills | Status: DC

## 2021-02-13 MED ORDER — PRALUENT 150 MG/ML ~~LOC~~ SOAJ: SUBCUTANEOUS | 11 refills | Status: DC

## 2021-02-13 NOTE — Patient Instructions (Addendum)
Medication Instructions:  Please START Amlodipine 10 mg daily  If you need a refill on your cardiac medications before your next appointment, please call your pharmacy.   Lab work: No new labs needed  Testing/Procedures: No new testing needed  Follow-Up: At [facility], you and your health needs are our priority.  As part of our continuing mission to provide you with exceptional heart care, we have created designated Provider Care Teams.  These Care Teams include your primary Cardiologist (physician) and Advanced Practice Providers (APPs -  Physician Assistants and Nurse Practitioners) who all work together to provide you with the care you need, when you need it.  You will need a follow up appointment in 12 months  Providers on your designated Care Team:   Murray Hodgkins, NP Christell Faith, PA-C [name], [place]   COVID-19 Vaccine Information can be found at: [email or web address] For questions related to vaccine distribution or appointments, please email [email or web address] or call [phone].

## 2021-02-13 NOTE — Progress Notes (Signed)
Patient ID: Dean Sullivan, male   DOB: [birth year], 65 y.o.   MRN: [id number] Cardiology Office Note  Date:  02/13/2021   ID:  Dean Sullivan, DOB 1959-05-10, MRN [id number]  PCP:  [facility]   Chief Complaint  Patient presents with   12 month follow up     "Doing well." Medications reviewed by the patient verbally.     HPI:  Dean Sullivan is a pleasant 65 year old gentleman with history of  coronary artery disease,   prior cardiac catheterization showing obstructive disease in his RCA and LAD, stent placed to each vessel (total stent x2) hyperlipidemia,  obstructive sleep apnea on CPAP,  presenting for Routine follow-up of his coronary artery disease   In follow-up today, reports that he feels well Discussed treatment of his previously on low testosterone,  had pellets ("painful"), changed to pill x2 twice a day, followed by injection which she is currently taking once a week.  For insurance coverage  On CPAP  Active, Denies shortness of breath on exertion, no chest pain concerning for angina  Concerned that very low cholesterol could be contributing to low testosterone Continues on Crestor and Repatha  Lab Results  Component Value Date   CHOL 66 (L) 12/20/2019   HDL 28 (L) 12/20/2019   LDLCALC 19 12/20/2019   TRIG 95 12/20/2019    Prior studies reviewed Echo 09/2018,  1. The left ventricle has normal systolic function, with an ejection fraction of 55-60%. The cavity size was normal. Left ventricular diastolic parameters were normal. No evidence of left ventricular regional wall motion abnormalities.  2. The right ventricle has normal systolic function. The cavity was normal. There is no increase in right ventricular wall thickness. Right ventricular systolic pressure could not be assessed.  3. Left atrial size was mildly dilated.  4. Right atrial size was mildly dilated.   EKG personally reviewed by myself on todays visit Shows normal sinus rhythm  rate 73 bpm nonspecific T wave abnormality Single PVC  Continues to take Crestor 5 mg daily. He had myalgias on 20 mg Did not tolerate Zetia Previously had side effects on WelChol On repatha  Family hx of prostate cancer  Reports having 2 stents placed either in 2012 or 2013 at hospital in [place].   PMH:   has a past medical history of Allergy, Coronary arteriosclerosis in native artery, GERD (gastroesophageal reflux disease), Glaucoma, Hearing loss of right ear, History of kidney stones, History of trigger finger, Hyperlipidemia, Hypertension, Irregular heart beat, Sleep apnea, and Type 2 diabetes mellitus ([place]) (03/18/2017).  PSH:    Past Surgical History:  Procedure Laterality Date   CARDIAC CATHETERIZATION     x2 stents [place]  2012   2   COLONOSCOPY WITH PROPOFOL N/A 04/21/2017   Procedure: COLONOSCOPY WITH PROPOFOL;  Surgeon: Jonathon Bellows, MD;  [facility] ENDOSCOPY;  Service: Gastroenterology;  Laterality: N/A;   LIPOMA EXCISION Left 03/09/2017   Procedure: EXCISION LIPOMA;  Surgeon: Earnestine Leys, MD;  Location: [facility] ORS;  Service: Orthopedics;  Laterality: Left;   PROSTATE BIOPSY  03/03/2018   RESECTION DISTAL CLAVICAL Left 03/09/2017   Procedure: RESECTION DISTAL CLAVICAL;  Surgeon: Earnestine Leys, MD;  Location: [facility] ORS;  Service: Orthopedics;  Laterality: Left;   SHOULDER ARTHROSCOPY WITH OPEN ROTATOR CUFF REPAIR Left 03/09/2017   Procedure: SHOULDER ARTHROSCOPY WITH OPEN ROTATOR CUFF REPAIR;  Surgeon: Earnestine Leys, MD;  Location: [facility] ORS;  Service: Orthopedics;  Laterality: Left;   SUBACROMIAL  DECOMPRESSION Left 03/09/2017   Procedure: SUBACROMIAL DECOMPRESSION;  Surgeon: Earnestine Leys, MD;  Location: [facility] ORS;  Service: Orthopedics;  Laterality: Left;  BICEPS TENOTOMY    Current Outpatient Medications  Medication Sig Dispense Refill   Alirocumab (PRALUENT) 150 MG/ML SOAJ INJECT ONE MILLILITER UNDER THE SKIN EVERY 14  DAYS **PLEASE CALL OFFICE TO SCHEDULE APPOINTMENT FOR FUTHER REFILLS 2 mL 6   aspirin 81 MG tablet Take 81 mg by mouth daily.     cetirizine (ZYRTEC) 10 MG tablet Take 10 mg by mouth daily.     dorzolamide (TRUSOPT) 2 % ophthalmic solution 1 drop 3 (three) times daily.     dutasteride (AVODART) 0.5 MG capsule Take 0.5 mg by mouth daily.     latanoprost (XALATAN) 0.005 % ophthalmic solution Place 1 drop into both eyes at bedtime.      losartan (COZAAR) 100 MG tablet Take 1 tablet by mouth once daily 90 tablet 0   metoprolol succinate (TOPROL-XL) 25 MG 24 hr tablet Take 1 tablet (25 mg total) by mouth daily. PLEASE CALL OFFICE TO SCHEDULE AN APPOINTMENT FOR FUTURE REFILLS. NO FURTHER REFILLS UNTIL SEEN IN CLINIC. 30 tablet 0   NON FORMULARY Testosterone injection once weekly     rosuvastatin (CRESTOR) 20 MG tablet Take 1/2 (one-half) tablet by mouth once daily 45 tablet 0   VITAMIN D PO Take 5,000 mcg by mouth daily.     No current facility-administered medications for this visit.     Allergies:   Patient has no known allergies.   Social History:  The patient  reports that he has never smoked. He has never used smokeless tobacco. He reports current alcohol use. He reports that he does not use drugs.   Family History:   family history includes Alcohol abuse in his father; Arthritis in his maternal grandmother; Cancer in his father; Diabetes in his mother; Heart Problems in his father; Other in his maternal aunt; Stroke in his father.    Review of Systems: Review of Systems  Constitutional: Negative.   Respiratory: Negative.    Cardiovascular: Negative.   Gastrointestinal: Negative.   Musculoskeletal:  Positive for joint pain.  Neurological: Negative.   Psychiatric/Behavioral: Negative.    All other systems reviewed and are negative.   PHYSICAL EXAM: VS:  BP (!) 150/90 (BP Location: Left Arm, Patient Position: Sitting, Cuff Size: Large)   Pulse 73   Ht 6\' 3"  (1.905 m)   Wt 259 lb 6  oz (117.7 kg)   SpO2 98%   BMI 32.42 kg/m  , BMI Body mass index is 32.42 kg/m. Constitutional:  oriented to person, place, and time. No distress.  HENT:  Head: Grossly normal Eyes:  no discharge. No scleral icterus.  Neck: No JVD, no carotid bruits  Cardiovascular: Regular rate and rhythm, no murmurs appreciated Pulmonary/Chest: Clear to auscultation bilaterally, no wheezes or rails Abdominal: Soft.  no distension.  no tenderness.  Musculoskeletal: Normal range of motion Neurological:  normal muscle tone. Coordination normal. No atrophy Skin: Skin warm and dry Psychiatric: normal affect, pleasant   Recent Labs: No results found for requested labs within last 8760 hours.    Lipid Panel Lab Results  Component Value Date   CHOL 66 (L) 12/20/2019   HDL 28 (L) 12/20/2019   LDLCALC 19 12/20/2019   TRIG 95 12/20/2019     Wt Readings from Last 3 Encounters:  02/13/21 259 lb 6 oz (117.7 kg)  12/19/19 278 lb (126.1 kg)  12/14/18 274 lb (  124.3 kg)      ASSESSMENT AND PLAN:  Coronary artery disease involving native coronary artery of native heart without angina pectoris - Currently with no symptoms of angina. No further workup at this time. Continue current medication regimen.  Hypertension goal BP (blood pressure) < 140/90 Blood pressure is well controlled on today's visit. No changes made to the medications.  Hyperlipidemia LDL goal <70 On repatha and Crestor 5 mg daily Consider holding the Crestor given very low cholesterol, low testosterone  Hypo-testosterone/erectile dysfunction Followed by urology  Obesity, Class I, BMI 30.0-34.9 (see actual BMI) We have encouraged continued exercise, careful diet management in an effort to lose weight.  Obstructive sleep apnea on CPAP On CPAP, machine recalled     Total encounter time more than 25 minutes  Greater than 50% was spent in counseling and coordination of care with the patient   Orders Placed This Encounter   Procedures   EKG 12-Lead      Signed, Esmond Plants, M.D., Ph.D. 02/13/2021  [place], [place]

## 2021-03-06 ENCOUNTER — Telehealth: Payer: Self-pay

## 2021-03-06 NOTE — Telephone Encounter (Signed)
Prior Authorization initiated by [email or web address] for Praluent 150mg /mL. KEY: [id number]  Your demographic data has been sent to [place] successfully!  [employer] typically takes 5-10 minutes to respond, but it may take a little longer in some cases. You will be notified by email when available. You can also check for an update later by opening this request from your dashboard. Please do not fax or call [employer] to resubmit this request. If you need assistance, please chat with CoverMyMeds or call us at [phone].  If it has been longer than 24 hours, please reach out to [place].

## 2021-05-06 ENCOUNTER — Telehealth: Payer: Self-pay | Admitting: Cardiovascular Disease

## 2021-05-06 NOTE — Telephone Encounter (Signed)
Was able to return call Mr. Longfield to f/u on his recent ER visit in [place] [facility]) d/t waking up with headache and tingling in upper chest. BP was elevated. ER thinks it is related to his testosterone medication and coming off of it.   Arrival tot he ER showed BP 180s/79, pt reports full cardiac work-up, EKG, labs, CXR all WNL. ER provider think cause is from stopping testosterone medication. Pt d/c form ER with BP WNL SBP 116.   Stop his testosterone medication last Wed a week ago, then symptoms started a week after on that following Thursday with the headaches. Could be contributed to medication causing headaches, and headaches causing the increase in BP.   Pt also reports has had sinus issue last few days, advised treat headaches with OTC sinus meds, Tylenol, or IBU to help with headaches. Suggest monitor BP as well.  PT reports takes his BP meds nightly before bed each night.  Amlodipine 10 mg daily Losartan 100 daily daily Toprol 25 mg daily  Advised can take BP in the morning, suggest be at rest for 10-15 mins or around lunch time while being at rest for 10-15 mins. If notice trends are still high and still experiencing headaches, can call office for a f/u appt. Otherwise all questions were address and no additional concerns at this time. Mr. Desroches thankful for the return call and advice. Agreeable to plan, will call back for if appt is needed for HTN.   Will try to request records, labs/EKG from recent ER visit.

## 2021-05-06 NOTE — Telephone Encounter (Signed)
Patient calling States he had some episodes this weekend when he was in [place]  BP was high and head throbbing  Went to their hospital and was checked out  States it could be his testosterone medicine causing it along with Praluent Would like to know if he should make changes Please call to discuss

## 2021-05-13 NOTE — Telephone Encounter (Signed)
ROI faxed to [place] at [facility] in [place], [place] Will await ER visit notes and diagnostic testing for review and add to pt's chart  Pt was seen for CP and HTN earlier this month at outside hospital.

## 2021-05-13 NOTE — Telephone Encounter (Signed)
Patient dropped off ROI form placed in box

## 2021-09-30 ENCOUNTER — Encounter: Payer: Self-pay | Admitting: Cardiovascular Disease

## 2022-01-31 ENCOUNTER — Telehealth: Payer: Self-pay | Admitting: Cardiovascular Disease

## 2022-01-31 NOTE — Telephone Encounter (Signed)
Pt would like to speak to RN about his praulent refill. Pt states pharmacy has sent over messages but it keeps getting denied. Informed pt he is due for a f/u and offered to sch for 03/2022. Pt would just like to talk to RN.

## 2022-01-31 NOTE — Telephone Encounter (Signed)
Spoke w/ pt. He reports that he is on Praluent and it is working really well for him at getting his chol #s down. sHe currently had Medicare, [place] and is planning on switching to state plan in Jan.  He took his last injection today and his refill/PA was denied.  He has rosuvastatin on hand in case he can't get a refill, but would like to know how to get the Praluent refilled. Advised him that I will make pharm aware and see what can be done and call him back w/ recommendations.

## 2022-02-05 ENCOUNTER — Telehealth: Payer: Self-pay | Admitting: Pharmacist

## 2022-02-05 NOTE — Telephone Encounter (Signed)
Patient has recently switched to [facility] from his state plan and they were not covering his praluent. Called [employer] to get medication approved for patient. Praluent covered at $64 and patient is aware and okay with the price. Patient to switch back to his state plan at the beginning of the new year and will reach out if needs a new PA for his praluent on the plan.  Sandford Craze, PharmD. [facility] PGY-1  02/05/2022 10:54 AM '

## 2022-02-21 ENCOUNTER — Telehealth: Payer: Self-pay | Admitting: Cardiovascular Disease

## 2022-02-21 MED ORDER — PRALUENT 150 MG/ML ~~LOC~~ SOAJ: SUBCUTANEOUS | 11 refills | Status: DC

## 2022-02-21 NOTE — Telephone Encounter (Signed)
Refill sent to pharmacy, pt made aware.

## 2022-02-21 NOTE — Telephone Encounter (Signed)
 *  STAT* If patient is at the pharmacy, call can be transferred to refill team.   1. Which medications need to be refilled? (please list name of each medication and dose if known)   Alirocumab (PRALUENT) 150 MG/ML SOAJ    2. Which pharmacy/location (including street and city if local pharmacy) is medication to be sent to? [facility] mail [facility]  3. Do they need a 30 day or 90 day supply? 30 days  Pt requesting a call back to confirm if medication has been sent out

## 2022-02-23 ENCOUNTER — Other Ambulatory Visit: Payer: Self-pay | Admitting: Cardiovascular Disease

## 2022-02-24 NOTE — Telephone Encounter (Signed)
Please schedule 12 month F/U appointment for 90 day refills. Thank you! 

## 2022-04-13 NOTE — Progress Notes (Signed)
Patient ID: Dean Sullivan, male   DOB: Mar 26, 1955, 67 y.o.   MRN: [id number] Cardiology Office Note  Date:  04/14/2022   ID:  Dean Sullivan 09-02-1955, MRN [id number]  PCP:  [facility]   Chief Complaint  Patient presents with   12 month follow up     "Doing well." Medications reviewed by the patient verbally.     HPI:  Dean Sullivan is a pleasant 67 year old gentleman with history of  coronary artery disease,  prior cardiac catheterization showing obstructive disease in his RCA and LAD, stent placed to each vessel (total stent x2) hyperlipidemia,  obstructive sleep apnea on CPAP,  presenting for Routine follow-up of his coronary artery disease   Last seen by myself in clinic November 2022 No chest pain/angina Active, frequent travel to [place] to take care of his mother  Jump in his cholesterol numbers off Crestor with Praluent alone Total chol 128, LDL 79 in July 2023 Prior to that numbers have been much lower  Urologist retired, needs follow-up with low testosterone Previously treated with pellets ("painful"), changed to pill x2 twice a day, followed by injection, changed to pill Dr. Eliberto Ivory prior urologist has since retired  On CPAP  EKG personally reviewed by myself on todays visit Normal sinus rhythm rate 67 bpm PVCs no significant ST-T wave changes  Prior studies reviewed Echo 09/2018,  1. The left ventricle has normal systolic function, with an ejection fraction of 55-60%. The cavity size was normal. Left ventricular diastolic parameters were normal. No evidence of left ventricular regional wall motion abnormalities.  2. The right ventricle has normal systolic function. The cavity was normal. There is no increase in right ventricular wall thickness. Right ventricular systolic pressure could not be assessed.  3. Left atrial size was mildly dilated.  4. Right atrial size was mildly dilated.  Continues to take Crestor 5 mg daily. He had myalgias on  20 mg Did not tolerate Zetia Previously had side effects on WelChol On repatha  Family hx of prostate cancer  Reports having 2 stents placed either in 2012 or 2013 at hospital in [place].   PMH:   has a past medical history of Allergy, Coronary arteriosclerosis in native artery, GERD (gastroesophageal reflux disease), Glaucoma, Hearing loss of right ear, History of kidney stones, History of trigger finger, Hyperlipidemia, Hypertension, Irregular heart beat, Sleep apnea, and Type 2 diabetes mellitus ([place]) (03/18/2017).  PSH:    Past Surgical History:  Procedure Laterality Date   CARDIAC CATHETERIZATION     x2 stents [place]  2012   2   COLONOSCOPY WITH PROPOFOL N/A 04/21/2017   Procedure: COLONOSCOPY WITH PROPOFOL;  Surgeon: Jonathon Bellows, MD;  Location: [facility] ENDOSCOPY;  Service: Gastroenterology;  Laterality: N/A;   LIPOMA EXCISION Left 03/09/2017   Procedure: EXCISION LIPOMA;  Surgeon: Earnestine Leys, MD;  Location: [facility] ORS;  Service: Orthopedics;  Laterality: Left;   PROSTATE BIOPSY  03/03/2018   RESECTION DISTAL CLAVICAL Left 03/09/2017   Procedure: RESECTION DISTAL CLAVICAL;  Surgeon: Earnestine Leys, MD;  Location: [facility] ORS;  Service: Orthopedics;  Laterality: Left;   SHOULDER ARTHROSCOPY WITH OPEN ROTATOR CUFF REPAIR Left 03/09/2017   Procedure: SHOULDER ARTHROSCOPY WITH OPEN ROTATOR CUFF REPAIR;  Surgeon: Earnestine Leys, MD;  Location: [facility] ORS;  Service: Orthopedics;  Laterality: Left;   SUBACROMIAL DECOMPRESSION Left 03/09/2017   Procedure: SUBACROMIAL DECOMPRESSION;  Surgeon: Earnestine Leys, MD;  Location: [facility] ORS;  Service: Orthopedics;  Laterality: Left;  BICEPS TENOTOMY    Current Outpatient Medications  Medication Sig Dispense Refill   Alirocumab (PRALUENT) 150 MG/ML SOAJ INJECT ONE MILLILITER UNDER THE SKIN EVERY 14 DAYS 2 mL 11   aspirin 81 MG tablet Take 81 mg by mouth daily.     cetirizine (ZYRTEC) 10 MG tablet Take 10 mg by  mouth daily.     dorzolamide (TRUSOPT) 2 % ophthalmic solution 1 drop 3 (three) times daily.     dutasteride (AVODART) 0.5 MG capsule Take 0.5 mg by mouth daily.     latanoprost (XALATAN) 0.005 % ophthalmic solution Place 1 drop into both eyes at bedtime.      losartan (COZAAR) 100 MG tablet Take 1 tablet (100 mg total) by mouth daily. 90 tablet 3   metoprolol succinate (TOPROL-XL) 25 MG 24 hr tablet Take 1 tablet by mouth once daily 30 tablet 1   PRESCRIPTION MEDICATION Testosterone 2 tablets by mouth BID     rosuvastatin (CRESTOR) 20 MG tablet Take 1/2 (one-half) tablet by mouth once daily 45 tablet 0   VITAMIN D PO Take 5,000 mcg by mouth daily.     VITAMIN E PO Take by mouth daily.     No current facility-administered medications for this visit.     Allergies:   Patient has no known allergies.   Social History:  The patient  reports that he has never smoked. He has never used smokeless tobacco. He reports current alcohol use. He reports that he does not use drugs.   Family History:   family history includes Alcohol abuse in his father; Arthritis in his maternal grandmother; Cancer in his father; Diabetes in his mother; Heart Problems in his father; Other in his maternal aunt; Stroke in his father.    Review of Systems: Review of Systems  Constitutional: Negative.   Respiratory: Negative.    Cardiovascular: Negative.   Gastrointestinal: Negative.   Musculoskeletal:  Positive for joint pain.  Neurological: Negative.   Psychiatric/Behavioral: Negative.    All other systems reviewed and are negative.    PHYSICAL EXAM: VS:  BP 132/80 (BP Location: Left Arm, Patient Position: Sitting, Cuff Size: Normal)   Pulse 67   Ht 6' 2.5" (1.892 m)   Wt 257 lb (116.6 kg)   SpO2 99%   BMI 32.56 kg/m  , BMI Body mass index is 32.56 kg/m. Constitutional:  oriented to person, place, and time. No distress.  HENT:  Head: Grossly normal Eyes:  no discharge. No scleral icterus.  Neck: No  JVD, no carotid bruits  Cardiovascular: Regular rate and rhythm, no murmurs appreciated Pulmonary/Chest: Clear to auscultation bilaterally, no wheezes or rails Abdominal: Soft.  no distension.  no tenderness.  Musculoskeletal: Normal range of motion Neurological:  normal muscle tone. Coordination normal. No atrophy Skin: Skin warm and dry Psychiatric: normal affect, pleasant  Recent Labs: No results found for requested labs within last 365 days.    Lipid Panel Lab Results  Component Value Date   CHOL 66 (L) 12/20/2019   HDL 28 (L) 12/20/2019   LDLCALC 19 12/20/2019   TRIG 95 12/20/2019     Wt Readings from Last 3 Encounters:  04/14/22 257 lb (116.6 kg)  02/13/21 259 lb 6 oz (117.7 kg)  12/19/19 278 lb (126.1 kg)      ASSESSMENT AND PLAN:  Coronary artery disease involving native coronary artery of native heart without angina pectoris - Currently with no symptoms of angina. No further workup at this time. Continue current medication  regimen. Lipid panel ordered, may need to go back on Crestor with Praluent  Hypertension goal BP (blood pressure) < 140/90 Blood pressure is well controlled on today's visit. No changes made to the medications.  Hyperlipidemia LDL goal <70 On repatha, previously stopped Crestor with jump in cholesterol numbers Repeat lipid panel ordered, if numbers continue to run high (LDL above goal) may need to restart Crestor 5 daily  Hypo-testosterone/erectile dysfunction He will reestablish with urology, as Dr. Eliberto Ivory has retired  Obesity, Class I, BMI 30.0-34.9 (see actual BMI) Recommend walking program, low carbohydrate diet  Obstructive sleep apnea on CPAP On CPAP    Total encounter time more than 30 minutes  Greater than 50% was spent in counseling and coordination of care with the patient   Orders Placed This Encounter  Procedures   EKG 12-Lead      Signed, Esmond Plants, M.D., Ph.D. 04/14/2022  [place],  [place]

## 2022-04-14 ENCOUNTER — Ambulatory Visit: Payer: Medicare PPO | Attending: Cardiovascular Disease | Admitting: Cardiovascular Disease

## 2022-04-14 ENCOUNTER — Encounter: Payer: Self-pay | Admitting: Cardiovascular Disease

## 2022-04-14 VITALS — BP 132/80 | HR 67 | Ht 74.5 in | Wt 257.0 lb

## 2022-04-14 DIAGNOSIS — I25118 Atherosclerotic heart disease of native coronary artery with other forms of angina pectoris: Secondary | ICD-10-CM

## 2022-04-14 DIAGNOSIS — G4733 Obstructive sleep apnea (adult) (pediatric): Secondary | ICD-10-CM | POA: Diagnosis not present

## 2022-04-14 DIAGNOSIS — E1169 Type 2 diabetes mellitus with other specified complication: Secondary | ICD-10-CM | POA: Diagnosis not present

## 2022-04-14 DIAGNOSIS — E785 Hyperlipidemia, unspecified: Secondary | ICD-10-CM

## 2022-04-14 DIAGNOSIS — E669 Obesity, unspecified: Secondary | ICD-10-CM | POA: Diagnosis not present

## 2022-04-14 DIAGNOSIS — E291 Testicular hypofunction: Secondary | ICD-10-CM

## 2022-04-14 NOTE — Patient Instructions (Addendum)
Medication Instructions:  No changes  If you need a refill on your cardiac medications before your next appointment, please call your pharmacy.   Lab work: [facility]: Lipids, testosterone  Testing/Procedures: No new testing needed  Follow-Up: At [facility], you and your health needs are our priority.  As part of our continuing mission to provide you with exceptional heart care, we have created designated Provider Care Teams.  These Care Teams include your primary Cardiologist (physician) and Advanced Practice Providers (APPs -  Physician Assistants and Nurse Practitioners) who all work together to provide you with the care you need, when you need it.  You will need a follow up appointment in 12 months  Providers on your designated Care Team:   Murray Hodgkins, NP Christell Faith, PA-C [name], [place]  COVID-19 Vaccine Information can be found at: [email or web address] For questions related to vaccine distribution or appointments, please email [email or web address] or call [phone].

## 2022-04-20 ENCOUNTER — Other Ambulatory Visit: Payer: Self-pay | Admitting: Cardiovascular Disease

## 2022-04-22 ENCOUNTER — Other Ambulatory Visit: Payer: Self-pay

## 2022-04-22 MED ORDER — ROSUVASTATIN CALCIUM 5 MG PO TABS: 5.0000 mg | ORAL_TABLET | ORAL | 3 refills | Status: DC

## 2022-04-23 LAB — LIPID PANEL
Chol/HDL Ratio: 3.7 ratio (ref 0.0–5.0)
Cholesterol, Total: 136 mg/dL (ref 100–199)
HDL: 37 mg/dL — ABNORMAL LOW (ref >39)
LDL Chol Calc (NIH): 79 mg/dL (ref 0–99)
Triglycerides: 109 mg/dL (ref 0–149)
VLDL Cholesterol Cal: 20 mg/dL (ref 5–40)

## 2022-04-23 LAB — TESTOSTERONE: Testosterone: 240 ng/dL — ABNORMAL LOW (ref 264–916)

## 2022-04-23 LAB — TESTOSTERONE, FREE: Testosterone, Free: 11.3 pg/mL (ref 6.6–18.1)

## 2022-04-28 DIAGNOSIS — H2513 Age-related nuclear cataract, bilateral: Secondary | ICD-10-CM | POA: Diagnosis not present

## 2022-04-28 DIAGNOSIS — H401131 Primary open-angle glaucoma, bilateral, mild stage: Secondary | ICD-10-CM | POA: Diagnosis not present

## 2022-04-29 ENCOUNTER — Telehealth: Payer: Self-pay | Admitting: Cardiovascular Disease

## 2022-04-29 NOTE — Telephone Encounter (Signed)
Pt c/o medication issue:  1. Name of Medication: Jatenzo 158 MG tablets   2. How are you currently taking this medication (dosage and times per day)? 2 tablets in the morning & 2 tablets at night   3. Are you having a reaction (difficulty breathing--STAT)? No   4. What is your medication issue? Patient states doctor who prescribes this retired, so he would like to know if Dr. Rockey Situ can prescribe it moving forward.

## 2022-04-29 NOTE — Telephone Encounter (Signed)
Spoke with the patient and he stated that his family physician/PCP has since retired and he was the prescriber of his testosterone capsule. Jatenzo 158 mg 2 capsules by mouth twice daily (632 mg). Patient is currently looking for a new PCP and was told the next available was June and he will be out of his medication this week. Patient has medication already approved through his [employer] [phone]. Patient asking Dr. Rockey Situ to fill prescription until he is able to get a new PCP.

## 2022-05-01 NOTE — Telephone Encounter (Signed)
Pt is calling to get update on prescriptions he is requesting. Requesting return call.

## 2022-05-02 ENCOUNTER — Telehealth: Payer: Self-pay | Admitting: Cardiovascular Disease

## 2022-05-02 ENCOUNTER — Other Ambulatory Visit: Payer: Self-pay

## 2022-05-02 MED ORDER — JATENZO 158 MG PO CAPS: 316.0000 mg | ORAL_CAPSULE | Freq: Two times a day (BID) | ORAL | 0 refills | Status: DC

## 2022-05-02 MED ORDER — DUTASTERIDE 0.5 MG PO CAPS: 0.5000 mg | ORAL_CAPSULE | Freq: Every day | ORAL | 0 refills | Status: DC

## 2022-05-02 NOTE — Telephone Encounter (Signed)
Spoke to patient and informed him of Dr. Donivan Scull recommendations We can send in a refill to cover him until June Would recommend he get set up with urology in case primary care does not want to manage Dr. Eliberto Ivory, who he was seeing, who retired was urology Bobbye Riggs   Prescriptions sent to patient's preferred pharmacies

## 2022-05-02 NOTE — Telephone Encounter (Signed)
*  STAT* If patient is at the pharmacy, call can be transferred to refill team.   1. Which medications need to be refilled? (please list name of each medication and dose if known)  new prescription for  Dutasteride 5 [place] Progression Recent Vital Signs   '@VS'$ @   Past Medical History:  Diagnosis Date   Allergy    Coronary arteriosclerosis in native artery    GERD (gastroesophageal reflux disease)    occ   Glaucoma    Hearing loss of right ear    patient was told he had slight hearing loss   History of kidney stones    h/o   History of trigger finger    bilateral, but mostly in right thumb   Hyperlipidemia    Hypertension    Irregular heart beat    Sleep apnea    cpap   Type 2 diabetes mellitus ([place]) 03/18/2017   A1c 6.28 Jan 2017     Expected Discharge Date  '@FLOW'$ (539672::8)@  Diet Order     None        VTE Documentation  '@FLOW'$ (7060410::1)@   Work Intensity Score/Level of Care  '@FLOW'$ (10536::1)@  '@LEVELOFCARE'$ @   Mobility  '@FLOW'$ (7060220::1)@     Significant Events    DC Barriers   Abnormal Labs:  Glyn Ade 05/02/2022, 11:14 AM mg 1 time a day  . Which pharmacy/location (including street and city if local pharmacy) is medication to be sent to? was sent to the wrong pharmacy- it should go to [facility] in [place] ,[place] on [address]  3. Do they need a 30 day or 90 days        *STAT* If patient is at the pharmacy, call can be transferred to refill team.   1. Which medications need to be refilled? (please list name of each medication and dose if known) Jatenzo 158 mg  2 times a day  2. Which pharmacy/location (including street and city if local pharmacy) is medication to be sent to? [facility]  [phone]  3. Do they need a 30 day or 90 day supply?

## 2022-05-02 NOTE — Telephone Encounter (Signed)
Spoke with staff at [facility] and informed them that the Rx was unable to be sent electronically so when the physician signs the order it will be faxed to [phone] for fulfillment

## 2022-05-02 NOTE — Telephone Encounter (Signed)
Duplicate note 

## 2022-05-02 NOTE — Telephone Encounter (Signed)
*  STAT* If patient is at the pharmacy, call can be transferred to refill team.   1. Which medications need to be refilled? (please list name of each medication and dose if known) new prescription for Dutasteride 0.5 mg 1 time a day  2. Which pharmacy/location (including street and city if local pharmacy) is medication to be sent to?patient said medicine was sent to the wrong pharmacy- please send to [facility], [place], [place]   3. Do they need a 30 day or 90 day supply?

## 2022-05-02 NOTE — Telephone Encounter (Signed)
Patient is following up, requesting to speak with a nurse who he spoke with previously. He informed me that the person he previously spoke with would have a better understanding of this matter. He states the pharmacy advised that they do not have his prescription.

## 2022-05-02 NOTE — Telephone Encounter (Signed)
*  STAT* If patient is at the pharmacy, call can be transferred to refill team.   1. Which medications need to be refilled? (please list name of each medication and dose if known) new prescription for Jatenzo 158 mg 2 times a day  2. Which pharmacy/location (including street and city if local pharmacy) is medication to be sent to?[facility] [phone]  3. Do they need a 30 day or 90 day supply?

## 2022-05-02 NOTE — Telephone Encounter (Signed)
Rx being addressed by RN-see telephone note

## 2022-05-02 NOTE — Addendum Note (Signed)
Addended by: Hayden Rasmussen A on: 05/02/2022 10:30 AM   Modules accepted: Orders

## 2022-05-02 NOTE — Telephone Encounter (Signed)
Follow Up:     Patient is callling back to see if Dr Rockey Situ was going to approve his Testosterone medicine( Jatenzo 158 mg) 2 times a day? If so, please call.to  [facility] --[phone]

## 2022-05-02 NOTE — Telephone Encounter (Signed)
[facility] is calling to get new RX sent for this patient.

## 2022-05-02 NOTE — Telephone Encounter (Signed)
Being addressed by RN-see telephone encounters on 05/02/22.

## 2022-05-09 ENCOUNTER — Other Ambulatory Visit: Payer: Self-pay | Admitting: Cardiovascular Disease

## 2022-06-16 ENCOUNTER — Other Ambulatory Visit (HOSPITAL_COMMUNITY): Payer: Self-pay

## 2022-06-16 ENCOUNTER — Telehealth: Payer: Self-pay | Admitting: Cardiovascular Disease

## 2022-06-16 MED ORDER — REPATHA SURECLICK 140 MG/ML ~~LOC~~ SOAJ: 1.0000 | SUBCUTANEOUS | 11 refills | Status: DC

## 2022-06-16 NOTE — Telephone Encounter (Signed)
  Pt c/o medication issue:  1. Name of Medication:   Alirocumab (PRALUENT) 150 MG/ML SOAJ     2. How are you currently taking this medication (dosage and times per day)? INJECT ONE MILLILITER UNDER THE SKIN EVERY 14 DAYS   3. Are you having a reaction (difficulty breathing--STAT)? No   4. What is your medication issue? Per pt, his insurance doesn't cover this medication anymore, they need prior auth or change the medication to repatha.He gave phone numbers for [facility] [phone] and phone number for Prior Auth [id number]

## 2022-06-16 NOTE — Telephone Encounter (Signed)
  MESSAGE RECEIVED THAT PRALUENT IS NO LONGER COVERED. PER TEST CLAIM NO PA NEEDED FOR REPATHA. RPH, PLEASE ADVISE IF CHANGE IS APPROPRIATE.

## 2022-06-16 NOTE — Telephone Encounter (Signed)
Pt previously took Repatha, no contraindications noted. New rx sent in for Repatha, left detailed message for pt advising him of med change.

## 2022-06-17 NOTE — Telephone Encounter (Signed)
Message is unclear. Called pt's pharmacy. They stated they needed rx for autoinjector pen instead of syringe. Advised them that  the rx WAS sent in for the autoinjector pen and received confirmation they got the rx yesterday. Then they found the correct rx in their system. Nothing is needed.

## 2022-06-17 NOTE — Telephone Encounter (Signed)
  [facility] calling to verify repatha prescription

## 2022-07-11 DIAGNOSIS — H401131 Primary open-angle glaucoma, bilateral, mild stage: Secondary | ICD-10-CM | POA: Diagnosis not present

## 2022-07-31 ENCOUNTER — Ambulatory Visit (INDEPENDENT_AMBULATORY_CARE_PROVIDER_SITE_OTHER): Payer: Medicare PPO | Admitting: Urology

## 2022-07-31 VITALS — BP 135/76 | HR 69 | Ht 75.0 in | Wt 250.0 lb

## 2022-07-31 DIAGNOSIS — N400 Enlarged prostate without lower urinary tract symptoms: Secondary | ICD-10-CM

## 2022-07-31 DIAGNOSIS — N401 Enlarged prostate with lower urinary tract symptoms: Secondary | ICD-10-CM

## 2022-07-31 DIAGNOSIS — E291 Testicular hypofunction: Secondary | ICD-10-CM

## 2022-07-31 DIAGNOSIS — R350 Frequency of micturition: Secondary | ICD-10-CM

## 2022-07-31 LAB — URINALYSIS, COMPLETE
Bilirubin, UA: NEGATIVE
Glucose, UA: NEGATIVE
Ketones, UA: NEGATIVE
Leukocytes,UA: NEGATIVE
Nitrite, UA: NEGATIVE
Protein,UA: NEGATIVE
RBC, UA: NEGATIVE
Specific Gravity, UA: 1.02 (ref 1.005–1.030)
Urobilinogen, Ur: 1 mg/dL (ref 0.2–1.0)
pH, UA: 6 (ref 5.0–7.5)

## 2022-07-31 LAB — MICROSCOPIC EXAMINATION: Epithelial Cells (non renal): >10 /hpf — AB (ref 0–10)

## 2022-07-31 NOTE — Progress Notes (Signed)
Dean Sullivan,acting as a Neurosurgeon for Dean Altes, MD.,have documented all relevant documentation on the behalf of Dean Altes, MD,as directed by  Dean Altes, MD while in the presence of Dean Altes, MD.  07/31/2022 3:00 PM   Dean Sullivan, Dean Sullivan [id number]  Referring provider: [facility], [place] [address] Dean Sullivan,  [place] [address]  Chief Complaint  Patient presents with   Establish Care   Hypogonadism   Benign Prostatic Hypertrophy    HPI: Dean Sullivan is a 67 y.o. male who presents for transfer of urologic care after recent retirement of his previous urologist.  Previously followed by Dr. Evelene Sullivan for hypogonadism, ED, and BPH Last seen 12/31/21 and had previously been treated with Testopel and subsequently testosterone injection and most recently, Jatenzo On dutasteride for BPH Previous prostate biopsy in 02/2018 for a left atypical prostate nodule with benign pathology (PSA 1.2) Remains on Jatenzo with good energy levels Since his last visit with Dr. Evelene Sullivan, he has noted some slight increased frequency and urgency, which is  worse with standing   PMH: Past Medical History:  Diagnosis Date   Allergy    Coronary arteriosclerosis in native artery    GERD (gastroesophageal reflux disease)    occ   Glaucoma    Hearing loss of right ear    patient was told he had slight hearing loss   History of kidney stones    h/o   History of trigger finger    bilateral, but mostly in right thumb   Hyperlipidemia    Hypertension    Irregular heart beat    Sleep apnea    cpap   Type 2 diabetes mellitus ([facility]) 03/18/2017   A1c 6.28 Jan 2017    Surgical History: Past Surgical History:  Procedure Laterality Date   CARDIAC CATHETERIZATION     x2 stents [facility]   CAROTID STENT INSERTION  2012   2   COLONOSCOPY WITH PROPOFOL N/A 04/21/2017   Procedure: COLONOSCOPY WITH PROPOFOL;  Surgeon: Wyline Mood, MD;   Location: [facility] ENDOSCOPY;  Service: Gastroenterology;  Laterality: N/A;   LIPOMA EXCISION Left 03/09/2017   Procedure: EXCISION LIPOMA;  Surgeon: Deeann Saint, MD;  Location: [facility] ORS;  Service: Orthopedics;  Laterality: Left;   PROSTATE BIOPSY  03/03/2018   RESECTION DISTAL CLAVICAL Left 03/09/2017   Procedure: RESECTION DISTAL CLAVICAL;  Surgeon: Deeann Saint, MD;  Location: [facility] ORS;  Service: Orthopedics;  Laterality: Left;   SHOULDER ARTHROSCOPY WITH OPEN ROTATOR CUFF REPAIR Left 03/09/2017   Procedure: SHOULDER ARTHROSCOPY WITH OPEN ROTATOR CUFF REPAIR;  Surgeon: Deeann Saint, MD;  Location: [facility] ORS;  Service: Orthopedics;  Laterality: Left;   SUBACROMIAL DECOMPRESSION Left 03/09/2017   Procedure: SUBACROMIAL DECOMPRESSION;  Surgeon: Deeann Saint, MD;  Location: [facility] ORS;  Service: Orthopedics;  Laterality: Left;  BICEPS TENOTOMY    Home Medications:  Allergies as of 07/31/2022   No Known Allergies      Medication List        Accurate as of Jul 31, 2022  3:00 PM. If you have any questions, ask your nurse or doctor.          aspirin 81 MG tablet Take 81 mg by mouth daily.   cetirizine 10 MG tablet Commonly known as: ZYRTEC Take 10 mg by mouth daily.   dorzolamide 2 % ophthalmic solution Commonly known as: TRUSOPT 1 drop 3 (three) times daily.   dutasteride 0.5 MG capsule Commonly known as:  AVODART Take 1 capsule (0.5 mg total) by mouth daily.   Jatenzo 158 MG Caps Generic drug: Testosterone Undecanoate Take 2 capsules (316 mg total) by mouth in the morning and at bedtime.   latanoprost 0.005 % ophthalmic solution Commonly known as: XALATAN Place 1 drop into both eyes at bedtime.   losartan 100 MG tablet Commonly known as: COZAAR Take 1 tablet by mouth once daily   metoprolol succinate 25 MG 24 hr tablet Commonly known as: TOPROL-XL Take 1 tablet by mouth once daily   Repatha SureClick 140 MG/ML Soaj Generic drug: Evolocumab Inject 140 mg into  the skin every 14 (fourteen) days.   rosuvastatin 5 MG tablet Commonly known as: CRESTOR Take 1 tablet (5 mg total) by mouth every other day.   VITAMIN D PO Take 5,000 mcg by mouth daily.   VITAMIN E PO Take by mouth daily.        Family History: Family History  Problem Relation Age of Onset   Diabetes Mother        adult onset - geriatric   Alcohol abuse Father    Cancer Father        prostate   Stroke Father        mini strokes   Heart Problems Father    Other Maternal Aunt        born with her lung outside of her body; lived until 18.   Arthritis Maternal Grandmother     Social History:  reports that he has never smoked. He has never used smokeless tobacco. He reports current alcohol use. He reports that he does not use drugs.   Physical Exam: BP 135/76   Pulse 69   Ht 6\' 3"  (1.905 m)   Wt 250 lb (113.4 kg)   BMI 31.25 kg/m   Constitutional:  Alert and oriented, No acute distress. HEENT: [place] AT Respiratory: Normal respiratory effort, no increased work of breathing. Psychiatric: Normal mood and affect.  Assessment & Plan:    1. Hypogonadism Doing well on Jatenzo, he did not need refills at this time Testosterone, PSA, hematocrit drawn today Lab visit in 6 months, testosterone/hematocrit Office visit one year for testosterone, hematocrit, PSA  2. BPH with lower urinary tract symptoms Slight frequency and urgency increase since his last visit with Dr. Evelene Sullivan though he states not bothersome enough that he desires additional medication I have reviewed the above documentation for accuracy and completeness, and I agree with the above.   Dean Altes, MD  [facility] [address] [place], [place] [address] [phone]

## 2022-08-01 ENCOUNTER — Encounter: Payer: Self-pay | Admitting: Urology

## 2022-08-01 LAB — TESTOSTERONE: Testosterone: 388 ng/dL (ref 264–916)

## 2022-08-01 LAB — PSA: Prostate Specific Ag, Serum: 2.7 ng/mL (ref 0.0–4.0)

## 2022-08-01 LAB — HEMATOCRIT: Hematocrit: 46.5 % (ref 37.5–51.0)

## 2022-08-09 ENCOUNTER — Telehealth: Payer: Self-pay | Admitting: Student

## 2022-08-09 NOTE — Telephone Encounter (Signed)
  Patient called After Hours Line to request refill of Repatha. Called and spoke with patient. He stated he is due for refill of Repatha. He should have plenty of refills for this (11 refills were provided in March 2024). He states he was told he needed a Prior Authorization. There is a note from our Pharmacy team in 05/2022 that no PA was needed for Repatha. I called and spoke with [facility] and they said a Prior Authorization is now needed because patient has a new insurance. I called and relayed this message to patient.  He said he got new insurance in January but his insurance has not changed since March. I advised patient to call [facility] and make sure the right insurance is on file. I will also send message to our Pharmacy to see if they can help with this on Monday.  Corrin Parker, PA-C 08/09/2022 11:47 AM

## 2022-08-11 ENCOUNTER — Telehealth: Payer: Self-pay

## 2022-08-11 ENCOUNTER — Other Ambulatory Visit (HOSPITAL_COMMUNITY): Payer: Self-pay

## 2022-08-11 NOTE — Telephone Encounter (Signed)
Pharmacy Patient Advocate Encounter   Received notification from [facility] that prior authorization for REPATHA 140MG /ML is required/requested.   PA submitted on 5.20.24 to (ins) [employer] via CoverMyMeds Key or (Medicaid) confirmation # [id number]  Status is pending

## 2022-08-11 NOTE — Telephone Encounter (Signed)
Patient made aware of approval. Pt appreciative of the help. Explained the difference between refill and PA.

## 2022-08-11 NOTE — Telephone Encounter (Signed)
Pharmacy Patient Advocate Encounter  Prior Authorization for REPATHA 140MG /ML has been approved by [facility] (ins).    PA # [id number] Effective dates: 5.20.24 through 5.20.25

## 2022-08-19 ENCOUNTER — Ambulatory Visit: Payer: BC Managed Care – PPO | Admitting: Family Medicine

## 2022-08-19 DIAGNOSIS — M5431 Sciatica, right side: Secondary | ICD-10-CM | POA: Diagnosis not present

## 2022-08-19 DIAGNOSIS — Z6832 Body mass index (BMI) 32.0-32.9, adult: Secondary | ICD-10-CM | POA: Diagnosis not present

## 2022-08-19 DIAGNOSIS — L259 Unspecified contact dermatitis, unspecified cause: Secondary | ICD-10-CM | POA: Diagnosis not present

## 2022-08-19 DIAGNOSIS — L282 Other prurigo: Secondary | ICD-10-CM | POA: Diagnosis not present

## 2022-08-25 ENCOUNTER — Ambulatory Visit (INDEPENDENT_AMBULATORY_CARE_PROVIDER_SITE_OTHER): Payer: Medicare PPO | Admitting: Family Medicine

## 2022-08-25 ENCOUNTER — Encounter: Payer: Self-pay | Admitting: Family Medicine

## 2022-08-25 VITALS — BP 126/72 | HR 91 | Temp 98.3°F | Resp 18 | Ht 75.0 in | Wt 253.7 lb

## 2022-08-25 DIAGNOSIS — E785 Hyperlipidemia, unspecified: Secondary | ICD-10-CM | POA: Diagnosis not present

## 2022-08-25 DIAGNOSIS — Z1211 Encounter for screening for malignant neoplasm of colon: Secondary | ICD-10-CM

## 2022-08-25 DIAGNOSIS — G4733 Obstructive sleep apnea (adult) (pediatric): Secondary | ICD-10-CM | POA: Diagnosis not present

## 2022-08-25 DIAGNOSIS — E1169 Type 2 diabetes mellitus with other specified complication: Secondary | ICD-10-CM

## 2022-08-25 DIAGNOSIS — I700 Atherosclerosis of aorta: Secondary | ICD-10-CM

## 2022-08-25 DIAGNOSIS — L255 Unspecified contact dermatitis due to plants, except food: Secondary | ICD-10-CM | POA: Diagnosis not present

## 2022-08-25 DIAGNOSIS — I2510 Atherosclerotic heart disease of native coronary artery without angina pectoris: Secondary | ICD-10-CM | POA: Diagnosis not present

## 2022-08-25 DIAGNOSIS — Z1159 Encounter for screening for other viral diseases: Secondary | ICD-10-CM

## 2022-08-25 DIAGNOSIS — Z23 Encounter for immunization: Secondary | ICD-10-CM | POA: Diagnosis not present

## 2022-08-25 DIAGNOSIS — M48062 Spinal stenosis, lumbar region with neurogenic claudication: Secondary | ICD-10-CM | POA: Diagnosis not present

## 2022-08-25 LAB — POCT GLYCOSYLATED HEMOGLOBIN (HGB A1C): Hemoglobin A1C: 5.7 % — AB (ref 4.0–5.6)

## 2022-08-25 MED ORDER — METHYLPREDNISOLONE 4 MG PO TBPK
ORAL_TABLET | ORAL | 0 refills | Status: DC
Start: 2022-08-25 — End: 2022-09-17

## 2022-08-25 NOTE — Patient Instructions (Addendum)
Shingrix at local pharmacy   Preventive Care 65 Years and Older, Male Preventive care refers to lifestyle choices and visits with your health care provider that can promote health and wellness. Preventive care visits are also called wellness exams. What can I expect for my preventive care visit? Counseling During your preventive care visit, your health care provider may ask about your: Medical history, including: Past medical problems. Family medical history. History of falls. Current health, including: Emotional well-being. Home life and relationship well-being. Sexual activity. Memory and ability to understand (cognition). Lifestyle, including: Alcohol, nicotine or tobacco, and drug use. Access to firearms. Diet, exercise, and sleep habits. Work and work Astronomer. Sunscreen use. Safety issues such as seatbelt and bike helmet use. Physical exam Your health care provider will check your: Height and weight. These may be used to calculate your BMI (body mass index). BMI is a measurement that tells if you are at a healthy weight. Waist circumference. This measures the distance around your waistline. This measurement also tells if you are at a healthy weight and may help predict your risk of certain diseases, such as type 2 diabetes and high blood pressure. Heart rate and blood pressure. Body temperature. Skin for abnormal spots. What immunizations do I need?  Vaccines are usually given at various ages, according to a schedule. Your health care provider will recommend vaccines for you based on your age, medical history, and lifestyle or other factors, such as travel or where you work. What tests do I need? Screening Your health care provider may recommend screening tests for certain conditions. This may include: Lipid and cholesterol levels. Diabetes screening. This is done by checking your blood sugar (glucose) after you have not eaten for a while (fasting). Hepatitis C  test. Hepatitis B test. HIV (human immunodeficiency virus) test. STI (sexually transmitted infection) testing, if you are at risk. Lung cancer screening. Colorectal cancer screening. Prostate cancer screening. Abdominal aortic aneurysm (AAA) screening. You may need this if you are a current or former smoker. Talk with your health care provider about your test results, treatment options, and if necessary, the need for more tests. Follow these instructions at home: Eating and drinking  Eat a diet that includes fresh fruits and vegetables, whole grains, lean protein, and low-fat dairy products. Limit your intake of foods with high amounts of sugar, saturated fats, and salt. Take vitamin and mineral supplements as recommended by your health care provider. Do not drink alcohol if your health care provider tells you not to drink. If you drink alcohol: Limit how much you have to 0-2 drinks a day. Know how much alcohol is in your drink. In the [place] one drink equals one 12 oz bottle of beer (355 mL), one 5 oz glass of wine (148 mL), or one 1 oz glass of hard liquor (44 mL). Lifestyle Brush your teeth every morning and night with fluoride toothpaste. Floss one time each day. Exercise for at least 30 minutes 5 or more days each week. Do not use any products that contain nicotine or tobacco. These products include cigarettes, chewing tobacco, and vaping devices, such as e-cigarettes. If you need help quitting, ask your health care provider. Do not use drugs. If you are sexually active, practice safe sex. Use a condom or other form of protection to prevent STIs. Take aspirin only as told by your health care provider. Make sure that you understand how much to take and what form to take. Work with your health care provider to  find out whether it is safe and beneficial for you to take aspirin daily. Ask your health care provider if you need to take a cholesterol-lowering medicine (statin). Find healthy  ways to manage stress, such as: Meditation, yoga, or listening to music. Journaling. Talking to a trusted person. Spending time with friends and family. Safety Always wear your seat belt while driving or riding in a vehicle. Do not drive: If you have been drinking alcohol. Do not ride with someone who has been drinking. When you are tired or distracted. While texting. If you have been using any mind-altering substances or drugs. Wear a helmet and other protective equipment during sports activities. If you have firearms in your house, make sure you follow all gun safety procedures. Minimize exposure to UV radiation to reduce your risk of skin cancer. What's next? Visit your health care provider once a year for an annual wellness visit. Ask your health care provider how often you should have your eyes and teeth checked. Stay up to date on all vaccines. This information is not intended to replace advice given to you by your health care provider. Make sure you discuss any questions you have with your health care provider. Document Revised: 09/05/2020 Document Reviewed: 09/05/2020 Elsevier Patient Education  2024 [employer].

## 2022-08-25 NOTE — Progress Notes (Signed)
Name: Dean Sullivan   MRN: [id number]    DOB: September 25, 1955   Date:08/25/2022       Progress Note  Subjective  Chief Complaint  Establish Care  HPI  Contact Dermatitis: he worked on his mother's yard about 2 weeks ago and a couple of days of later he developed a pruriginous rash that was initially on his arm, with blisters, it started to spread his legs, he went to urgent care and was diagnosed with Rhus dermatitis, he was given hydroxizine , topical medication and it has improved but the rash is still present , and seems to be spreading on right arm  DMII with dyslipidemia: diagnosed in 2018, two levels above 6.5 % but is on life style modification and A1C has normalized. Today level is 5.7 % - explained steroids will make glucose go up and needs to be strict with diet for the next 5 days.He  is taking statin and Repatha, HTN is under control. He is due for labs today. He denies polyphagia, polydipsia or polyuria.   CAD: under the care of Dr. Mariah Milling and is compliant with treatment plan. Recently resumed statin on top of Repatha since LDL was not at goal. No chest pain or palpitation  OSA:  he is wearing his CPAP machine, last study was about 9 years ago.   DDD lumbar spine: he has been going to [facility], recently seen at urgent care and was given pain medication but not taking it . He states when standing up straight he has pain on both buttocks and radiates down posterior right thigh. Discussed neurogenic claudication due to spinal stenosis and he will go back to [facility]    Patient Active Problem List   Diagnosis Date Noted   Neurogenic claudication due to lumbar spinal stenosis 08/25/2022   Degeneration of lumbar intervertebral disc 01/23/2020   Trigger finger of right hand 05/14/2018   CAD (coronary artery disease), native coronary artery 09/07/2017   Diabetes mellitus type 2 in obese 03/18/2017   Colon cancer screening 02/19/2017   Tear of left rotator cuff 01/26/2017    Complete tear of left rotator cuff 01/12/2017   Injury of tendon of long head of left biceps 01/12/2017   Rotator cuff tendinitis, left 01/12/2017   Acquired trigger finger 12/31/2016   Ganglion cyst 09/11/2016   Impingement syndrome of left shoulder region 09/11/2016   Radial styloid tenosynovitis 09/11/2016   Screening for prostate cancer 09/10/2016   Medication monitoring encounter 03/12/2016   Trigger finger of right hand 02/20/2015   Hyperlipidemia LDL goal <70 01/05/2015   Status post coronary artery stent placement 01/05/2015   Hypertension goal BP (blood pressure) < 130/80 01/05/2015   Obesity 01/05/2015   Abdominal obesity-metabolic syndrome type 3 01/05/2015   Nasal sinus congestion 01/05/2015   Right foot pain 01/05/2015   Obstructive sleep apnea on CPAP 01/05/2015   Presence of coronary angioplasty implant and graft 05/05/2012   Chest pain 05/03/2012    Past Surgical History:  Procedure Laterality Date   CARDIAC CATHETERIZATION     x2 stents [facility]   CAROTID STENT INSERTION  2012   2   COLONOSCOPY WITH PROPOFOL N/A 04/21/2017   Procedure: COLONOSCOPY WITH PROPOFOL;  Surgeon: Wyline Mood, MD;  Location: [facility] ENDOSCOPY;  Service: Gastroenterology;  Laterality: N/A;   LIPOMA EXCISION Left 03/09/2017   Procedure: EXCISION LIPOMA;  Surgeon: Deeann Saint, MD;  Location: [facility] ORS;  Service: Orthopedics;  Laterality: Left;   PROSTATE BIOPSY  03/03/2018  RESECTION DISTAL CLAVICAL Left 03/09/2017   Procedure: RESECTION DISTAL CLAVICAL;  Surgeon: Deeann Saint, MD;  Location: [facility] ORS;  Service: Orthopedics;  Laterality: Left;   SHOULDER ARTHROSCOPY WITH OPEN ROTATOR CUFF REPAIR Left 03/09/2017   Procedure: SHOULDER ARTHROSCOPY WITH OPEN ROTATOR CUFF REPAIR;  Surgeon: Deeann Saint, MD;  Location: [facility] ORS;  Service: Orthopedics;  Laterality: Left;   SUBACROMIAL DECOMPRESSION Left 03/09/2017   Procedure: SUBACROMIAL DECOMPRESSION;  Surgeon: Deeann Saint, MD;   Location: [facility] ORS;  Service: Orthopedics;  Laterality: Left;  BICEPS TENOTOMY    Family History  Problem Relation Age of Onset   Diabetes Mother        adult onset - geriatric   Alcohol abuse Father    Cancer Father        prostate   Stroke Father        mini strokes   Heart Problems Father    Other Maternal Aunt        born with her lung outside of her body; lived until 52.   Arthritis Maternal Grandmother     Social History   Tobacco Use   Smoking status: Never   Smokeless tobacco: Never  Substance Use Topics   Alcohol use: Yes    Alcohol/week: 0.0 standard drinks of alcohol    Comment: a glass of wine/beer     Current Outpatient Medications:    aspirin 81 MG tablet, Take 81 mg by mouth daily., Disp: , Rfl:    cetirizine (ZYRTEC) 10 MG tablet, Take 10 mg by mouth daily., Disp: , Rfl:    dorzolamide (TRUSOPT) 2 % ophthalmic solution, 1 drop 3 (three) times daily., Disp: , Rfl:    dutasteride (AVODART) 0.5 MG capsule, Take 1 capsule (0.5 mg total) by mouth daily., Disp: 150 capsule, Rfl: 0   Evolocumab (REPATHA SURECLICK) 140 MG/ML SOAJ, Inject 140 mg into the skin every 14 (fourteen) days., Disp: 2 mL, Rfl: 11   hydrOXYzine (ATARAX) 10 MG tablet, Take 10 mg by mouth 3 (three) times daily., Disp: , Rfl:    latanoprost (XALATAN) 0.005 % ophthalmic solution, Place 1 drop into both eyes at bedtime. , Disp: , Rfl:    losartan (COZAAR) 100 MG tablet, Take 1 tablet by mouth once daily, Disp: 90 tablet, Rfl: 3   methylPREDNISolone (MEDROL DOSEPAK) 4 MG TBPK tablet, Take as directed, Disp: 21 tablet, Rfl: 0   metoprolol succinate (TOPROL-XL) 25 MG 24 hr tablet, Take 1 tablet by mouth once daily, Disp: 30 tablet, Rfl: 10   rosuvastatin (CRESTOR) 5 MG tablet, Take 1 tablet (5 mg total) by mouth every other day., Disp: 45 tablet, Rfl: 3   Testosterone Undecanoate (JATENZO) 158 MG CAPS, Take 2 capsules (316 mg total) by mouth in the morning and at bedtime., Disp: 600 capsule, Rfl: 0    triamcinolone cream (KENALOG) 0.1 %, Apply topically., Disp: , Rfl:    VITAMIN D PO, Take 5,000 mcg by mouth daily., Disp: , Rfl:    VITAMIN E PO, Take by mouth daily., Disp: , Rfl:    tiZANidine (ZANAFLEX) 4 MG tablet, Take 8 mg by mouth 2 (two) times daily. (Patient not taking: Reported on 08/25/2022), Disp: , Rfl:   No Known Allergies  I personally reviewed active problem list, medication list, allergies, family history, social history, health maintenance with the patient/caregiver today.   ROS  Ten systems reviewed and is negative except as mentioned in HPI   Objective  Vitals:   08/25/22 1331  BP: 126/72  Pulse: 91  Resp: 18  Temp: 98.3 F (36.8 C)  TempSrc: Oral  SpO2: 98%  Weight: 253 lb 11.2 oz (115.1 kg)  Height: 6\' 3"  (1.905 m)    Body mass index is 31.71 kg/m.  Physical Exam  Constitutional: Patient appears well-developed and well-nourished. Obese  No distress.  HEENT: head atraumatic, normocephalic, pupils equal and reactive to light, neck supple Cardiovascular: Normal rate, regular rhythm and normal heart sounds.  No murmur heard. No BLE edema. Pulmonary/Chest: Effort normal and breath sounds normal. No respiratory distress. Abdominal: Soft.  There is no tenderness. Skin: multiple lesions on arms and legs, in clusters , some with erythematous base, some areas of excoriation and ulceration  Psychiatric: Patient has a normal mood and affect. behavior is normal. Judgment and thought content normal.   Recent Results (from the past 2160 hour(s))  Urinalysis, Complete     Status: None   Collection Time: 07/31/22  3:03 PM  Result Value Ref Range   Specific Gravity, UA 1.020 1.005 - 1.030   pH, UA 6.0 5.0 - 7.5   Color, UA Yellow Yellow   Appearance Ur Clear Clear   Leukocytes,UA Negative Negative   Protein,UA Negative Negative/Trace   Glucose, UA Negative Negative   Ketones, UA Negative Negative   RBC, UA Negative Negative   Bilirubin, UA Negative Negative    Urobilinogen, Ur 1.0 0.2 - 1.0 mg/dL   Nitrite, UA Negative Negative   Microscopic Examination See below:   Microscopic Examination     Status: Abnormal   Collection Time: 07/31/22  3:03 PM   Urine  Result Value Ref Range   WBC, UA 0-5 0 - 5 /hpf   RBC, Urine 0-2 0 - 2 /hpf   Epithelial Cells (non renal) >10 (A) 0 - 10 /hpf   Mucus, UA Present (A) Not Estab.   Bacteria, UA Moderate (A) None seen/Few  Testosterone     Status: None   Collection Time: 07/31/22  3:26 PM  Result Value Ref Range   Testosterone 388 264 - 916 ng/dL    Comment: Adult male reference interval is based on a population of healthy nonobese males (BMI <30) between 42 and 38 years old. Travison, et.al. JCEM [phone]. PMID: [id number].   PSA     Status: None   Collection Time: 07/31/22  3:26 PM  Result Value Ref Range   Prostate Specific Ag, Serum 2.7 0.0 - 4.0 ng/mL    Comment: Roche ECLIA methodology. According to the American Urological Association, Serum PSA should decrease and remain at undetectable levels after radical prostatectomy. The AUA defines biochemical recurrence as an initial PSA value 0.2 ng/mL or greater followed by a subsequent confirmatory PSA value 0.2 ng/mL or greater. Values obtained with different assay methods or kits cannot be used interchangeably. Results cannot be interpreted as absolute evidence of the presence or absence of malignant disease.   Hematocrit     Status: None   Collection Time: 07/31/22  3:26 PM  Result Value Ref Range   Hematocrit 46.5 37.5 - 51.0 %  POCT HgB A1C     Status: Abnormal   Collection Time: 08/25/22  2:12 PM  Result Value Ref Range   Hemoglobin A1C 5.7 (A) 4.0 - 5.6 %   HbA1c POC (<> result, manual entry)     HbA1c, POC (prediabetic range)     HbA1c, POC (controlled diabetic range)      PHQ2/9:    08/25/2022    1:42 PM 08/25/2022  1:40 PM 07/27/2018   10:26 AM 06/02/2018    9:08 AM 05/26/2018    1:17 PM  Depression screen PHQ 2/9   Decreased Interest 0 0 0 0 0  Down, Depressed, Hopeless 0 0 0 0 0  PHQ - 2 Score 0 0 0 0 0  Altered sleeping 0  0 0 0  Tired, decreased energy 0  0 0 0  Change in appetite 0  0 0 0  Feeling bad or failure about yourself  0  0 0 0  Trouble concentrating 0  0 0 0  Moving slowly or fidgety/restless 0  0 0 0  Suicidal thoughts 0  0 0 0  PHQ-9 Score 0  0 0 0  Difficult doing work/chores   Not difficult at all Not difficult at all Not difficult at all    phq 9 is negative   Fall Risk:    08/25/2022    1:34 PM 07/27/2018   10:26 AM 06/02/2018    9:08 AM 05/26/2018    1:17 PM 03/05/2018   11:46 AM  Fall Risk   Falls in the past year? 0 0 0 0 0  Number falls in past yr:  0 0 0   Injury with Fall?  0 0 0   Risk for fall due to : No Fall Risks      Follow up Falls prevention discussed;Education provided;Falls evaluation completed          Functional Status Survey: Is the patient deaf or have difficulty hearing?: No Does the patient have difficulty seeing, even when wearing glasses/contacts?: No Does the patient have difficulty concentrating, remembering, or making decisions?: No Does the patient have difficulty walking or climbing stairs?: Yes Does the patient have difficulty dressing or bathing?: No Does the patient have difficulty doing errands alone such as visiting a doctor's office or shopping?: No    Assessment & Plan   1. Dyslipidemia associated with type 2 diabetes mellitus (HCC)  - Lipid panel - Microalbumin / creatinine urine ratio - COMPLETE METABOLIC PANEL WITH GFR - POCT HgB A1C - today level was 5.7 %   2. Atherosclerosis of abdominal aorta (HCC)  Discussed CT results from years ago, on statin therapy and Repatha   3. Need for vaccination for pneumococcus  - Pneumococcal conjugate vaccine 20-valent (Prevnar 20)  4. Coronary artery disease involving native coronary artery of native heart without angina pectoris  Under the care of cardiologist   5.  Obstructive sleep apnea on CPAP  Compliant   6. Rhus dermatitis  - methylPREDNISolone (MEDROL DOSEPAK) 4 MG TBPK tablet; Take as directed  Dispense: 21 tablet; Refill: 0  7. Neurogenic claudication due to lumbar spinal stenosis  He will follow up with Ortho  8. Colon cancer screening  - Ambulatory referral to Gastroenterology  9. Need for hepatitis C screening test  - Hepatitis C antibody

## 2022-08-26 LAB — MICROALBUMIN / CREATININE URINE RATIO
Creatinine, Urine: 143 mg/dL (ref 20–320)
Microalb Creat Ratio: 13 mg/g creat (ref <30)
Microalb, Ur: 1.8 mg/dL

## 2022-08-26 LAB — COMPLETE METABOLIC PANEL WITH GFR
AG Ratio: 1.7 (calc) (ref 1.0–2.5)
ALT: 26 U/L (ref 9–46)
AST: 20 U/L (ref 10–35)
Albumin: 4.7 g/dL (ref 3.6–5.1)
Alkaline phosphatase (APISO): 63 U/L (ref 35–144)
BUN: 14 mg/dL (ref 7–25)
CO2: 21 mmol/L (ref 20–32)
Calcium: 9.8 mg/dL (ref 8.6–10.3)
Chloride: 105 mmol/L (ref 98–110)
Creat: 1.07 mg/dL (ref 0.70–1.35)
Globulin: 2.7 g/dL (calc) (ref 1.9–3.7)
Glucose, Bld: 86 mg/dL (ref 65–99)
Potassium: 4.2 mmol/L (ref 3.5–5.3)
Sodium: 139 mmol/L (ref 135–146)
Total Bilirubin: 0.9 mg/dL (ref 0.2–1.2)
Total Protein: 7.4 g/dL (ref 6.1–8.1)
eGFR: 77 mL/min/{1.73_m2} (ref >=60)

## 2022-08-26 LAB — LIPID PANEL
Cholesterol: 88 mg/dL (ref <200)
HDL: 45 mg/dL (ref >=40)
LDL Cholesterol (Calc): 24 mg/dL (calc)
Non-HDL Cholesterol (Calc): 43 mg/dL (calc) (ref <130)
Total CHOL/HDL Ratio: 2 (calc) (ref <5.0)
Triglycerides: 108 mg/dL (ref <150)

## 2022-08-26 LAB — HEPATITIS C ANTIBODY: Hepatitis C Ab: NONREACTIVE

## 2022-08-27 DIAGNOSIS — M48061 Spinal stenosis, lumbar region without neurogenic claudication: Secondary | ICD-10-CM | POA: Diagnosis not present

## 2022-08-29 ENCOUNTER — Ambulatory Visit: Payer: BC Managed Care – PPO | Admitting: Nurse Practitioner

## 2022-09-03 DIAGNOSIS — M48061 Spinal stenosis, lumbar region without neurogenic claudication: Secondary | ICD-10-CM | POA: Insufficient documentation

## 2022-09-05 ENCOUNTER — Encounter: Payer: Self-pay | Admitting: *Deleted

## 2022-09-11 ENCOUNTER — Telehealth: Payer: Self-pay | Admitting: Urology

## 2022-09-11 ENCOUNTER — Other Ambulatory Visit: Payer: Self-pay | Admitting: *Deleted

## 2022-09-11 ENCOUNTER — Other Ambulatory Visit: Payer: Self-pay | Admitting: Cardiovascular Disease

## 2022-09-11 DIAGNOSIS — M48061 Spinal stenosis, lumbar region without neurogenic claudication: Secondary | ICD-10-CM | POA: Diagnosis not present

## 2022-09-11 DIAGNOSIS — M1711 Unilateral primary osteoarthritis, right knee: Secondary | ICD-10-CM | POA: Diagnosis not present

## 2022-09-11 NOTE — Telephone Encounter (Signed)
Pt needs a refill of Jatenzo sent to [facility].  He is almost out.

## 2022-09-12 MED ORDER — JATENZO 158 MG PO CAPS: 316.0000 mg | ORAL_CAPSULE | Freq: Two times a day (BID) | ORAL | 0 refills | Status: DC

## 2022-09-17 ENCOUNTER — Telehealth: Payer: Self-pay | Admitting: Cardiovascular Disease

## 2022-09-17 ENCOUNTER — Ambulatory Visit
Admission: EM | Admit: 2022-09-17 | Discharge: 2022-09-17 | Disposition: A | Discharge status: Home / Self Care | Payer: Medicare PPO | Attending: Physician Assistant | Admitting: Physician Assistant

## 2022-09-17 DIAGNOSIS — R142 Eructation: Secondary | ICD-10-CM | POA: Diagnosis not present

## 2022-09-17 DIAGNOSIS — R079 Chest pain, unspecified: Secondary | ICD-10-CM | POA: Diagnosis not present

## 2022-09-17 DIAGNOSIS — K219 Gastro-esophageal reflux disease without esophagitis: Secondary | ICD-10-CM | POA: Diagnosis not present

## 2022-09-17 LAB — CBC WITH DIFFERENTIAL/PLATELET
Abs Immature Granulocytes: 0.02 10*3/uL (ref 0.00–0.07)
Basophils Absolute: 0.1 10*3/uL (ref 0.0–0.1)
Basophils Relative: 1 %
Eosinophils Absolute: 0.1 10*3/uL (ref 0.0–0.5)
Eosinophils Relative: 1 %
HCT: 46.8 % (ref 39.0–52.0)
Hemoglobin: 15.6 g/dL (ref 13.0–17.0)
Immature Granulocytes: 0 %
Lymphocytes Relative: 37 %
Lymphs Abs: 2.4 10*3/uL (ref 0.7–4.0)
MCH: 28.9 pg (ref 26.0–34.0)
MCHC: 33.3 g/dL (ref 30.0–36.0)
MCV: 86.7 fL (ref 80.0–100.0)
Monocytes Absolute: 0.6 10*3/uL (ref 0.1–1.0)
Monocytes Relative: 10 %
Neutro Abs: 3.2 10*3/uL (ref 1.7–7.7)
Neutrophils Relative %: 51 %
Platelets: 245 10*3/uL (ref 150–400)
RBC: 5.4 MIL/uL (ref 4.22–5.81)
RDW: 15.6 % — ABNORMAL HIGH (ref 11.5–15.5)
WBC: 6.3 10*3/uL (ref 4.0–10.5)
nRBC: 0 % (ref 0.0–0.2)

## 2022-09-17 LAB — COMPREHENSIVE METABOLIC PANEL
ALT: 24 U/L (ref 0–44)
AST: 18 U/L (ref 15–41)
Albumin: 4.4 g/dL (ref 3.5–5.0)
Alkaline Phosphatase: 51 U/L (ref 38–126)
Anion gap: 6 (ref 5–15)
BUN: 15 mg/dL (ref 8–23)
CO2: 22 mmol/L (ref 22–32)
Calcium: 9.2 mg/dL (ref 8.9–10.3)
Chloride: 108 mmol/L (ref 98–111)
Creatinine, Ser: 0.91 mg/dL (ref 0.61–1.24)
GFR, Estimated: >60 mL/min (ref >60)
Glucose, Bld: 106 mg/dL — ABNORMAL HIGH (ref 70–99)
Potassium: 3.8 mmol/L (ref 3.5–5.1)
Sodium: 136 mmol/L (ref 135–145)
Total Bilirubin: 1.1 mg/dL (ref 0.3–1.2)
Total Protein: 7.6 g/dL (ref 6.5–8.1)

## 2022-09-17 LAB — TROPONIN I (HIGH SENSITIVITY): Troponin I (High Sensitivity): 6 ng/L (ref <18)

## 2022-09-17 LAB — LIPASE, BLOOD: Lipase: 36 U/L (ref 11–51)

## 2022-09-17 MED ORDER — ALUM & MAG HYDROXIDE-SIMETH 200-200-20 MG/5ML PO SUSP
30.0000 mL | Freq: Once | ORAL | Status: AC
  Administered 2022-09-17: 30 mL via ORAL

## 2022-09-17 MED ORDER — LIDOCAINE VISCOUS HCL 2 % MT SOLN
15.0000 mL | Freq: Once | OROMUCOSAL | Status: AC
  Administered 2022-09-17: 15 mL via ORAL

## 2022-09-17 MED ORDER — PANTOPRAZOLE SODIUM 20 MG PO TBEC: 20.0000 mg | DELAYED_RELEASE_TABLET | Freq: Two times a day (BID) | ORAL | 1 refills | Status: DC

## 2022-09-17 NOTE — Discharge Instructions (Addendum)
Your EKG is a little different from the one shown in January.  We did perform a troponin level which is a cardiac enzyme that is elevated with blockages.  It is within normal limits.  However, I do think you should follow-up with your cardiologist.  Symptoms could be due to GERD.  You do report taking NSAIDs pretty consistently for back pain, knee pain and sciatica recently.  You have been advised to discontinue use of NSAIDs.  Tylenol as needed for pain relief.  Sent pantoprazole to pharmacy.  Also, you can continue with Pepto-Bismol and dietary modifications.  Try Gas X over the counter. Increase rest and fluids.  May consider heat, ice, muscle rubs, lidocaine patches in case this is musculoskeletal as well.  You are advised to make a follow-up appointment with cardiology.  You may need a new stress test if it has been more than 10 years since your last one and especially if symptoms continue. You are advised to go to the emergency department if any increased or worsening chest pain, numbness/weakness or tingling of extremities, worsening back pain, shortness of breath, palpitations, weakness, dizziness, feeling faint or passing out.

## 2022-09-17 NOTE — ED Provider Notes (Signed)
[facility]    CSN: [id number] Arrival date & time: 09/17/22  1254      History   Chief Complaint Chief Complaint  Patient presents with   Chest Pain   Belching    HPI Dean Sullivan is a 67 y.o. male.   Patient presents for 1 week history of intermittent sharp "electric kind of pain" of the left chest. Also reports bloating and belching. Has been taking Alka Seltzer without relief. Tried vinegar which was mildly helpful.  Denies increased pain on breathing or SOB. No tachycardia, dizziness, fatigue or weakness. He says he only has pain with laying down, twisting, and moving his left arm. Increased bloating after eating. He has felt a little back pain today but that went away with belching. Has not felt the chest pain since being in UC today.  Reports dancing and being very active but no recent changes. No known injuries.  Denies recent illness. No cough, congestion or fever. No abdominal pain, nausea, vomiting.   Patient reports contacting cardiologist office.  States they told him his symptoms do not appear to be cardiac and he is to go to urgent care for something for the belching.  Denies ever having sharp chest pains with GERD before. Patient's cardiologist is Dr. Lewie Loron. Patient states he had stents placed for CAD 10 years ago. He cannot recall his last stress test.  Patient says the episodes are less frequent now from onset.   Patient says he has been taking NSAIDs (2 ibuprofen daily) for back pain, knee pain, and sciatica. He is to start PT tomorrow.  HPI  Past Medical History:  Diagnosis Date   Allergy    Coronary arteriosclerosis in native artery    GERD (gastroesophageal reflux disease)    occ   Glaucoma    Hearing loss of right ear    patient was told he had slight hearing loss   History of kidney stones    h/o   History of trigger finger    bilateral, but mostly in right thumb   Hyperlipidemia    Hypertension    Irregular heart beat    Sleep  apnea    cpap   Type 2 diabetes mellitus (HCC) 03/18/2017   A1c 6.28 Jan 2017    Patient Active Problem List   Diagnosis Date Noted   Neurogenic claudication due to lumbar spinal stenosis 08/25/2022   Degeneration of lumbar intervertebral disc 01/23/2020   Trigger finger of right hand 05/14/2018   CAD (coronary artery disease), native coronary artery 09/07/2017   Diabetes mellitus type 2 in obese 03/18/2017   Colon cancer screening 02/19/2017   Tear of left rotator cuff 01/26/2017   Complete tear of left rotator cuff 01/12/2017   Injury of tendon of long head of left biceps 01/12/2017   Rotator cuff tendinitis, left 01/12/2017   Acquired trigger finger 12/31/2016   Ganglion cyst 09/11/2016   Impingement syndrome of left shoulder region 09/11/2016   Radial styloid tenosynovitis 09/11/2016   Screening for prostate cancer 09/10/2016   Medication monitoring encounter 03/12/2016   Trigger finger of right hand 02/20/2015   Hyperlipidemia LDL goal <70 01/05/2015   Status post coronary artery stent placement 01/05/2015   Hypertension goal BP (blood pressure) < 130/80 01/05/2015   Obesity 01/05/2015   Abdominal obesity-metabolic syndrome type 3 01/05/2015   Nasal sinus congestion 01/05/2015   Right foot pain 01/05/2015   Obstructive sleep apnea on CPAP 01/05/2015   Presence of coronary angioplasty implant  and graft 05/05/2012   Chest pain 05/03/2012    Past Surgical History:  Procedure Laterality Date   CARDIAC CATHETERIZATION     x2 stents [facility]   CAROTID STENT INSERTION  2012   2   COLONOSCOPY WITH PROPOFOL N/A 04/21/2017   Procedure: COLONOSCOPY WITH PROPOFOL;  Surgeon: Wyline Mood, MD;  Location: [facility] ENDOSCOPY;  Service: Gastroenterology;  Laterality: N/A;   LIPOMA EXCISION Left 03/09/2017   Procedure: EXCISION LIPOMA;  Surgeon: Deeann Saint, MD;  Location: [facility] ORS;  Service: Orthopedics;  Laterality: Left;   PROSTATE BIOPSY  03/03/2018   RESECTION DISTAL  CLAVICAL Left 03/09/2017   Procedure: RESECTION DISTAL CLAVICAL;  Surgeon: Deeann Saint, MD;  Location: [facility] ORS;  Service: Orthopedics;  Laterality: Left;   SHOULDER ARTHROSCOPY WITH OPEN ROTATOR CUFF REPAIR Left 03/09/2017   Procedure: SHOULDER ARTHROSCOPY WITH OPEN ROTATOR CUFF REPAIR;  Surgeon: Deeann Saint, MD;  Location: [facility] ORS;  Service: Orthopedics;  Laterality: Left;   SUBACROMIAL DECOMPRESSION Left 03/09/2017   Procedure: SUBACROMIAL DECOMPRESSION;  Surgeon: Deeann Saint, MD;  Location: [facility] ORS;  Service: Orthopedics;  Laterality: Left;  BICEPS TENOTOMY       Home Medications    Prior to Admission medications   Medication Sig Start Date End Date Taking? Authorizing Provider  pantoprazole (PROTONIX) 20 MG tablet Take 1 tablet (20 mg total) by mouth 2 (two) times daily for 15 days. 09/17/22 10/02/22 Yes Shirlee Latch, PA-C  aspirin 81 MG tablet Take 81 mg by mouth daily.    [provider]  cetirizine (ZYRTEC) 10 MG tablet Take 10 mg by mouth daily.    [provider]  dorzolamide (TRUSOPT) 2 % ophthalmic solution 1 drop 3 (three) times daily. 12/13/20   [provider]  dutasteride (AVODART) 0.5 MG capsule Take 1 capsule (0.5 mg total) by mouth daily. 05/02/22   Antonieta Iba, MD  Evolocumab (REPATHA SURECLICK) 140 MG/ML SOAJ Inject 140 mg into the skin every 14 (fourteen) days. 06/16/22   Antonieta Iba, MD  hydrOXYzine (ATARAX) 10 MG tablet Take 10 mg by mouth 3 (three) times daily. 08/19/22   [provider]  latanoprost (XALATAN) 0.005 % ophthalmic solution Place 1 drop into both eyes at bedtime.  08/24/16   [provider]  losartan (COZAAR) 100 MG tablet Take 1 tablet by mouth once daily 05/09/22   Antonieta Iba, MD  metoprolol succinate (TOPROL-XL) 25 MG 24 hr tablet Take 1 tablet by mouth once daily 04/21/22   Antonieta Iba, MD  rosuvastatin (CRESTOR) 5 MG tablet Take 1 tablet (5 mg total) by mouth every other day.  04/22/22   Antonieta Iba, MD  Testosterone Undecanoate (JATENZO) 158 MG CAPS Take 2 capsules (316 mg total) by mouth in the morning and at bedtime. 09/12/22   Stoioff, Verna Czech, MD  tiZANidine (ZANAFLEX) 4 MG tablet Take 8 mg by mouth 2 (two) times daily. Patient not taking: Reported on 08/25/2022 08/19/22   [provider]  triamcinolone cream (KENALOG) 0.1 % Apply topically. 08/19/22   [provider]  VITAMIN D PO Take 5,000 mcg by mouth daily.    [provider]  VITAMIN E PO Take by mouth daily.    [provider]    Family History Family History  Problem Relation Age of Onset   Diabetes Mother        adult onset - geriatric   Alcohol abuse Father    Cancer Father  prostate   Stroke Father        mini strokes   Heart Problems Father    Other Maternal Aunt        born with her lung outside of her body; lived until 35.   Arthritis Maternal Grandmother     Social History Social History   Tobacco Use   Smoking status: Never   Smokeless tobacco: Never  Vaping Use   Vaping Use: Never used  Substance Use Topics   Alcohol use: Yes    Alcohol/week: 0.0 standard drinks of alcohol    Comment: a glass of wine/beer   Drug use: No     Allergies   Patient has no known allergies.   Review of Systems Review of Systems  Constitutional:  Negative for fatigue and fever.  HENT:  Negative for congestion.   Respiratory:  Negative for cough and shortness of breath.   Cardiovascular:  Positive for chest pain. Negative for palpitations and leg swelling.  Gastrointestinal:  Negative for abdominal distention, abdominal pain, anal bleeding, blood in stool, constipation, diarrhea, nausea, rectal pain and vomiting.  Neurological:  Negative for weakness.     Physical Exam Triage Vital Signs ED Triage Vitals  Enc Vitals Group     BP      Pulse      Resp      Temp      Temp src      SpO2      Weight      Height      Head Circumference       Peak Flow      Pain Score      Pain Loc      Pain Edu?      Excl. in GC?    No data found.  Updated Vital Signs BP (!) 144/79 (BP Location: Right Arm)   Pulse 69   Temp 98.8 F (37.1 C) (Oral)   SpO2 97%     Physical Exam Vitals and nursing note reviewed.  Constitutional:      General: He is not in acute distress.    Appearance: Normal appearance. He is well-developed. He is not ill-appearing.  HENT:     Head: Normocephalic and atraumatic.     Nose: Nose normal.     Mouth/Throat:     Mouth: Mucous membranes are moist.     Pharynx: Oropharynx is clear.  Eyes:     General: No scleral icterus.    Conjunctiva/sclera: Conjunctivae normal.  Cardiovascular:     Rate and Rhythm: Normal rate and regular rhythm.     Heart sounds: Normal heart sounds.  Pulmonary:     Effort: Pulmonary effort is normal. No respiratory distress.     Breath sounds: Normal breath sounds.  Chest:     Chest wall: No tenderness.  Abdominal:     Palpations: Abdomen is soft.     Tenderness: There is no abdominal tenderness.  Musculoskeletal:     Cervical back: Neck supple.  Skin:    General: Skin is warm and dry.     Capillary Refill: Capillary refill takes less than 2 seconds.  Neurological:     General: No focal deficit present.     Mental Status: He is alert. Mental status is at baseline.     Motor: No weakness.     Gait: Gait normal.  Psychiatric:        Mood and Affect: Mood normal.  Behavior: Behavior normal.      UC Treatments / Results  Labs (all labs ordered are listed, but only abnormal results are displayed) Labs Reviewed  CBC WITH DIFFERENTIAL/PLATELET - Abnormal; Notable for the following components:      Result Value   RDW 15.6 (*)    All other components within normal limits  COMPREHENSIVE METABOLIC PANEL - Abnormal; Notable for the following components:   Glucose, Bld 106 (*)    All other components within normal limits  LIPASE, BLOOD  TROPONIN I (HIGH  SENSITIVITY)    EKG   Radiology No results found.  Procedures ED EKG  Date/Time: 09/17/2022 3:10 PM  Performed by: Shirlee Latch, PA-C Authorized by: Shirlee Latch, PA-C   Previous ECG:    Previous ECG:  Compared to current   Similarity:  Changes noted   Comparison ECG info:  EKG from 04/14/22 shows sinus rhythm with occasional PVCs. Today there are changes in V1 and V2 T waves Interpretation:    Interpretation: abnormal   Rate:    ECG rate:  70   ECG rate assessment: normal   Rhythm:    Rhythm: sinus rhythm   Ectopy:    Ectopy: PVCs   QRS:    QRS axis:  Normal   QRS intervals:  Normal   QRS conduction: normal   ST segments:    ST segments:  Normal T waves:    T waves: non-specific     Details:  Flat T wave in V2 and inverted T wave in V1 Comments:     Normal sinus rhythm with occasional PVCs.  Flat T wave in V2 and inverted T wave in V1.  (including critical care time)  Medications Ordered in UC Medications  alum & mag hydroxide-simeth (MAALOX/MYLANTA) 200-200-20 MG/5ML suspension 30 mL (30 mLs Oral Given 09/17/22 1452)    And  lidocaine (XYLOCAINE) 2 % viscous mouth solution 15 mL (15 mLs Oral Given 09/17/22 1452)    Initial Impression / Assessment and Plan / UC Course  I have reviewed the triage vital signs and the nursing notes.  Pertinent labs & imaging results that were available during my care of the patient were reviewed by me and considered in my medical decision making (see chart for details).   67 year old male with history of hypertension, type 2 diabetes, CAD with previous stent placement, GERD, allergies, arrhythmia, sleep apnea presents for 1 week history of intermittent sharp left-sided chest pains, abdominal bloating and belching.  Patient reports trying Alka-Seltzer without relief.  He has also tried Pepto-Bismol without relief.  Patient states he previously took a PPI but has not recently taken 1.  Patient reports increased pain after lying  down and with twisting and moving the left arm.  He contacted his cardiologist office regarding his symptoms and was told it did not sound like this could be due to cardiac problem.  They advised urgent care.  Blood pressure 144/79.  Other vitals normal and stable.  He is overall well-appearing.  On exam heart regular rate and rhythm.  Chest clear to auscultation.  No abdominal tenderness.  No tenderness of ribs or chest.  No tenderness of back.  EKG performed today and compared to one from January 2024.  Both show sinus rhythm with occasional PVCs.  Today's EKG shows flat T wave in V2 and inverted T wave in V1.  Advised patient of EKG results.  Advised lab work to include CBC, CMP, lipase and troponin.  Patient is  agreeable.  Lab work unremarkable.  Troponin within normal limits.  Patient was given a GI cocktail in clinic.  Symptoms could be due to GERD.  He does report taking NSAIDs pretty consistently for back pain, knee pain and sciatica recently.  Advised him to discontinue use of NSAIDs.  Tylenol as needed for pain relief.  Sent pantoprazole to pharmacy.  Also advised he can continue with Pepto-Bismol and dietary modifications.  Increase rest and fluids.  May consider heat, ice, muscle rubs, lidocaine patches in case this is musculoskeletal as well.  Advised patient to make a follow-up appointment with cardiology.  He may need a new stress test if it has been more than 10 years since his last one and especially if symptoms continue. Advised to go to the emergency department if any increased or worsening chest pain, numbness/weakness or tingling of extremities, worsening back pain, shortness of breath, palpitations, weakness, dizziness, feeling faint or passing out.   Final Clinical Impressions(s) / UC Diagnoses   Final diagnoses:  Chest pain, unspecified type  Belching  Gastroesophageal reflux disease, unspecified whether esophagitis present     Discharge Instructions      Your  EKG is a little different from the one shown in January.  We did perform a troponin level which is a cardiac enzyme that is elevated with blockages.  It is within normal limits.  However, I do think you should follow-up with your cardiologist.  Symptoms could be due to GERD.  You do report taking NSAIDs pretty consistently for back pain, knee pain and sciatica recently.  You have been advised to discontinue use of NSAIDs.  Tylenol as needed for pain relief.  Sent pantoprazole to pharmacy.  Also, you can continue with Pepto-Bismol and dietary modifications.  Try Gas X over the counter. Increase rest and fluids.  May consider heat, ice, muscle rubs, lidocaine patches in case this is musculoskeletal as well.  You are advised to make a follow-up appointment with cardiology.  You may need a new stress test if it has been more than 10 years since your last one and especially if symptoms continue. You are advised to go to the emergency department if any increased or worsening chest pain, numbness/weakness or tingling of extremities, worsening back pain, shortness of breath, palpitations, weakness, dizziness, feeling faint or passing out.     ED Prescriptions     Medication Sig Dispense Auth. Provider   pantoprazole (PROTONIX) 20 MG tablet Take 1 tablet (20 mg total) by mouth 2 (two) times daily for 15 days. 30 tablet Gareth Morgan      PDMP not reviewed this encounter.   Shirlee Latch, PA-C 09/17/22 [phone]

## 2022-09-17 NOTE — Telephone Encounter (Signed)
Pt c/o of Chest Pain: STAT if CP now or developed within 24 hours  1. Are you having CP right now?  No   2. Are you experiencing any other symptoms (ex. SOB, nausea, vomiting, sweating)?  Gas/belching  3. How long have you been experiencing CP?  Past 4-5 days  4. Is your CP continuous or coming and going?  Sharp left-sided CP, coming and going with movement   5. Have you taken Nitroglycerin?  No  ?

## 2022-09-17 NOTE — ED Triage Notes (Addendum)
Pt reports x5 days has been having lots of gas/belching and sharp pains LT side of chest, pt states he called his PCP this morning and they said it didn't sound like something cardiac, pt states he was told to come to UC. PT states he started feeling pain in back this morning. Pt states this pain happens after eating or when lying down.

## 2022-09-17 NOTE — Telephone Encounter (Signed)
Spoke with patient and he stated that he has been having some intermittent chest pain that he describes as sharp but only when lying down. Patient stated that he can feel it under his left pectoral muscle especially when he lies down. Patient stated that he had some darker stools and informed patient that taking antacids can cause that symptom. Instructed patient that he should present to the nearest urgent care clinic to be assessed and evaluated. Gave the patient the following home care information  GENERAL CARE ADVICE FOR GASTRIC REFLUX: * Avoid chocolate, mints, tomatoes, and citrus (e.g., oranges) * Avoid lying down for 3 or more hours after eating * Avoid or reduce caffeine (coffee, tea, colas) * Eat a low-fat diet (less than 45 grams of fat per day) * Elevate head of bed * Stop smoking  FOOD: * Eat smaller meals and avoid snacks right before sleeping. * Avoid the following foods which tend to aggravate heartburn and stomach problems: fatty/greasy foods, spicy foods, caffeinated beverages, and chocolate. ANTACID MEDICINES FOR GASTRIC REFLUX: * Try taking a liquid antacid (e.g., Mylanta, Maalox) 1 hour after meals and before bedtime. * Dose: 2 tablespoons (30 ml). * Before taking any medicine, read all the instructions on the package. CALL BACK IF: * Difficulty breathing or unusual sweating occurs * Chest pain increases in frequency, duration or severity * Chest pain lasts over 5 minutes * You become worse

## 2022-09-18 DIAGNOSIS — M48061 Spinal stenosis, lumbar region without neurogenic claudication: Secondary | ICD-10-CM | POA: Diagnosis not present

## 2022-09-19 ENCOUNTER — Ambulatory Visit: Payer: Medicare PPO | Attending: Physician Assistant | Admitting: Physician Assistant

## 2022-09-19 ENCOUNTER — Encounter: Payer: Self-pay | Admitting: Physician Assistant

## 2022-09-19 ENCOUNTER — Other Ambulatory Visit
Admission: RE | Admit: 2022-09-19 | Discharge: 2022-09-19 | Disposition: A | Discharge status: Home / Self Care | Payer: Medicare PPO | Source: Ambulatory Visit | Attending: Physician Assistant | Admitting: Physician Assistant

## 2022-09-19 ENCOUNTER — Telehealth: Payer: Self-pay | Admitting: Cardiovascular Disease

## 2022-09-19 ENCOUNTER — Telehealth: Payer: Self-pay | Admitting: Family Medicine

## 2022-09-19 VITALS — BP 155/72 | HR 72 | Ht 75.5 in | Wt 251.8 lb

## 2022-09-19 DIAGNOSIS — I493 Ventricular premature depolarization: Secondary | ICD-10-CM

## 2022-09-19 DIAGNOSIS — R42 Dizziness and giddiness: Secondary | ICD-10-CM | POA: Diagnosis not present

## 2022-09-19 DIAGNOSIS — I200 Unstable angina: Secondary | ICD-10-CM | POA: Insufficient documentation

## 2022-09-19 DIAGNOSIS — I10 Essential (primary) hypertension: Secondary | ICD-10-CM | POA: Diagnosis not present

## 2022-09-19 DIAGNOSIS — E785 Hyperlipidemia, unspecified: Secondary | ICD-10-CM

## 2022-09-19 DIAGNOSIS — I25110 Atherosclerotic heart disease of native coronary artery with unstable angina pectoris: Secondary | ICD-10-CM

## 2022-09-19 DIAGNOSIS — R079 Chest pain, unspecified: Secondary | ICD-10-CM | POA: Diagnosis not present

## 2022-09-19 LAB — BASIC METABOLIC PANEL
Anion gap: 9 (ref 5–15)
BUN: 13 mg/dL (ref 8–23)
CO2: 22 mmol/L (ref 22–32)
Calcium: 9.3 mg/dL (ref 8.9–10.3)
Chloride: 104 mmol/L (ref 98–111)
Creatinine, Ser: 1 mg/dL (ref 0.61–1.24)
GFR, Estimated: >60 mL/min (ref >60)
Glucose, Bld: 105 mg/dL — ABNORMAL HIGH (ref 70–99)
Potassium: 3.8 mmol/L (ref 3.5–5.1)
Sodium: 135 mmol/L (ref 135–145)

## 2022-09-19 LAB — CBC
HCT: 48.5 % (ref 39.0–52.0)
Hemoglobin: 16 g/dL (ref 13.0–17.0)
MCH: 28.6 pg (ref 26.0–34.0)
MCHC: 33 g/dL (ref 30.0–36.0)
MCV: 86.6 fL (ref 80.0–100.0)
Platelets: 253 10*3/uL (ref 150–400)
RBC: 5.6 MIL/uL (ref 4.22–5.81)
RDW: 15.6 % — ABNORMAL HIGH (ref 11.5–15.5)
WBC: 7.1 10*3/uL (ref 4.0–10.5)
nRBC: 0 % (ref 0.0–0.2)

## 2022-09-19 LAB — MAGNESIUM: Magnesium: 2.2 mg/dL (ref 1.7–2.4)

## 2022-09-19 NOTE — Telephone Encounter (Signed)
Pt would like a callback regarding his most recent urgent care visit, the medication he was given and side effects. He stated he was advised to go to the ED but he'd rather speak with a nurse to see what MD would like for him to do. Please advise

## 2022-09-19 NOTE — Patient Instructions (Addendum)
Medication Instructions:   Your physician recommends that you continue on your current medications as directed. Please refer to the Current Medication list given to you today.  *If you need a refill on your cardiac medications before your next appointment, please call your pharmacy*   Lab Work:  Your physician recommends that you have lab work TODAY.  - [facility] the [facility] You will check in at the front desk to the right as you walk into the atrium. Valet Parking is offered if needed. - No appointment needed. You may go any day between 7 am and 6 pm.   BMP/CBC/MAGNESIUM  If you have labs (blood work) drawn today and your tests are completely normal, you will receive your results only by: MyChart Message (if you have MyChart) OR A paper copy in the mail If you have any lab test that is abnormal or we need to change your treatment, we will call you to review the results.   Testing/Procedures:   [place] [facility] DEPT OF [facility] AT [place] [address] [place] [address] Dept: [phone] Loc: [phone]  Mattthew Kawecki  09/19/2022  You are scheduled for a Cardiac Catheterization on Tuesday, July 2 with Dr. Cristal Deer End PENDING SCHEDULING.  1. Please arrive at the [facility] Entrance of [facility], [address], [place] [address] at PENDING [facility] (This is 1 hour(s) prior to your procedure time).  Proceed to the Check-In Desk directly inside the entrance.  Procedure Parking: Use the entrance off of the [address] side of the hospital. Turn right upon entering and follow the driveway to parking that is directly in front of the Heart & Vascular Center. There is no valet parking available at this entrance, however there is an awning directly in front of the Heart & Vascular Center for drop off/ pick up for patients.  Special note: Every effort is made to  have your procedure done on time. Please understand that emergencies sometimes delay scheduled procedures.  2. Diet: Do not eat solid foods after midnight.  The patient may have clear liquids until 5am upon the day of the procedure.  3. Labs: You will need to have blood drawn on Friday, July 28 at [facility], Go to 1st desk on your right to register.  Address: [address] [place], [place] [address]  Open: 8am - 5pm  Phone: [phone]. You do not need to be fasting.  4. Medication instructions in preparation for your procedure:   Contrast Allergy: No    Stop taking, Cozaar (Losartan) Tuesday, September 23, 2022 Please hold medication the day of your procedure.   On the morning of your procedure, take your Aspirin 81 mg and any morning medicines NOT listed above.  You may use sips of water.  5. Plan to go home the same day, you will only stay overnight if medically necessary. 6. Bring a current list of your medications and current insurance cards. 7. You MUST have a responsible person to drive you home. 8. Someone MUST be with you the first 24 hours after you arrive home or your discharge will be delayed. 9. Please wear clothes that are easy to get on and off and wear slip-on shoes.  Thank you for allowing [place] to care for you!   -- [facility]    Follow-Up: At [facility], you and your health needs  are our priority.  As part of our continuing mission to provide you with exceptional heart care, we have created designated Provider Care Teams.  These Care Teams include your primary Cardiologist (physician) and Advanced Practice Providers (APPs -  Physician Assistants and Nurse Practitioners) who all work together to provide you with the care you need, when you need it.  We recommend signing up for the patient portal called "MyChart".  Sign up information is provided on this After Visit Summary.  MyChart is used to connect with  patients for Virtual Visits (Telemedicine).  Patients are able to view lab/test results, encounter notes, upcoming appointments, etc.  Non-urgent messages can be sent to your provider as well.   To learn more about what you can do with MyChart, go to [email or web address].    Your next appointment:    Post Cath   Provider:   You may see Julien Nordmann, MD or one of the following Advanced Practice Providers on your designated Care Team:   Nicolasa Ducking, NP Eula Listen, PA-C Cadence Fransico Michael, PA-C Charlsie Quest, NP

## 2022-09-19 NOTE — H&P (View-Only) (Signed)
 Cardiology Office Note    Date:  09/19/2022   ID:  Dean Sullivan, DOB 01/14/1956, MRN [id number]  PCP:  Sowles, Krichna, MD  Cardiologist:  Timothy Gollan, MD  Electrophysiologist:  None   Chief Complaint: Urgent care follow-up  History of Present Illness:   Dean Sullivan is a 66 y.o. male with history of CAD status post remote PCI to the LAD and RCA in 04/2012 at outside hospital, DM2, HTN, HLD, obesity, and OSA on CPAP who presents for urgent care follow-up.  He underwent cardiac cath in 04/2012 at outside hospital, in the setting of an abnormal Myoview, which showed mild plaquing of the left main without obstructive disease, proximal LAD 70-75% stenosis s/p PCI/DES, moderate plaquing of the LCx without obstructive disease, large and dominant RCA with 85 to 95% mid RCA stenosis s/p PCI/DES with 10% residual stenosis, EF estimated at 55 to 60%, mild LV chamber dilation with mild global hypokinesis.    Zio patch in 08/2018 demonstrated a predominant rhythm of sinus with an average rate of 72 bpm with 2 episodes of SVT occurring with the fastest and longest interval lasting 10.8 seconds, there were occasional PACs and PVCs.  Lexiscan MPI from 08/2018 was abnormal with heterogeneous uptake throughout the myocardium without focal defect to suggest significant ischemia or scar.  LVEF estimated at 30 to 44%, though there was significant extracardiac activity that may have underestimated LVEF.  Overall, this was an intermediate risk study based on reduced LVEF.  Elevated lung-heart ratio was noted.  Echo in 09/2018 demonstrated an EF of 55 to 60%, no regional wall motion abnormalities, normal RV systolic function and ventricular cavity size, mild biatrial enlargement, tricuspid aortic valve, and normal size and structure aortic root.  He was seen at an urgent care on 09/17/2022 with a one week history of intermittent sharp left-sided chest pain with associated bloating and belching.  Alka-Seltzer was  without relief.  He was taking NSAIDs, 2 ibuprofen daily, for orthopedic pain and sciatica.  EKG tracing is not available for review.  High-sensitivity troponin of 6 at that time.  Lipase normal.  He was advised to discontinue NSAIDs and follow-up with cardiology.  Pantoprazole was prescribed.    He comes in today noting an episode of left lower chest discomfort that was associate with belching and bloating that occurred on the evening hours following working out in the yard at his mother's house.  Leading up to this event, he has been doing a fair amount of yard work and had developed some contact dermatitis on his upper extremities.  In this setting, he was treated with intramuscular, oral, and topical steroids for contact dermatitis and joint discomfort.  In this setting, he noted some intermittent tingling of the upper and lower extremities as well as some intermittent dizziness that appears to have been more pronounced following initiation of pantoprazole.  He has been without chest discomfort or belching since taking GI cocktail at the urgent care.  Occasionally, he has noted some palpitations described as a pounding sensation.  Symptoms are not reminiscent, though are not exactly similar to his prior angina.  No near-syncope or syncope.  Currently without symptoms of angina or cardiac decompensation.   Labs independently reviewed: 08/2022 - potassium 3.8, BUN 15, serum creatinine 0.91, albumin 4.4, AST/ALT normal, hemoglobin 15.6, platelet 245, TC 88, TG 108, HDL 45, LDL 24, A1c 5.7 08/2018 - TSH normal  Past Medical History:  Diagnosis Date   Allergy    Coronary arteriosclerosis   in native artery    GERD (gastroesophageal reflux disease)    occ   Glaucoma    Hearing loss of right ear    patient was told he had slight hearing loss   History of kidney stones    h/o   History of trigger finger    bilateral, but mostly in right thumb   Hyperlipidemia    Hypertension    Irregular heart beat     Sleep apnea    cpap   Type 2 diabetes mellitus (HCC) 03/18/2017   A1c 6.28 Jan 2017    Past Surgical History:  Procedure Laterality Date   CARDIAC CATHETERIZATION     x2 stents [facility]   CAROTID STENT INSERTION  2012   2   COLONOSCOPY WITH PROPOFOL N/A 04/21/2017   Procedure: COLONOSCOPY WITH PROPOFOL;  Surgeon: Anna, Kiran, MD;  Location: [facility];  Service: Gastroenterology;  Laterality: N/A;   LIPOMA EXCISION Left 03/09/2017   Procedure: EXCISION LIPOMA;  Surgeon: Miller, Howard, MD;  Location: [facility] OR[facility];  Service: Orthopedics;  Laterality: Left;   PROSTATE BIOPSY  03/03/2018   RESECTION DISTAL CLAVICAL Left 03/09/2017   Procedure: RESECTION DISTAL CLAVICAL;  Surgeon: Miller, Howard, MD;  Location: [facility] OR[facility];  Service: Orthopedics;  Laterality: Left;   SHOULDER ARTHROSCOPY WITH OPEN ROTATOR CUFF REPAIR Left 03/09/2017   Procedure: SHOULDER ARTHROSCOPY WITH OPEN ROTATOR CUFF REPAIR;  Surgeon: Miller, Howard, MD;  Location: [facility] OR[facility];  Service: Orthopedics;  Laterality: Left;   SUBACROMIAL DECOMPRESSION Left 03/09/2017   Procedure: SUBACROMIAL DECOMPRESSION;  Surgeon: Miller, Howard, MD;  Location: [facility] OR[facility];  Service: Orthopedics;  Laterality: Left;  BICEPS TENOTOMY    Current Medications: Current Meds  Medication Sig   aspirin 81 MG tablet Take 81 mg by mouth daily.   cetirizine (ZYRTEC) 10 MG tablet Take 10 mg by mouth daily.   dorzolamide (TRUSOPT) 2 % ophthalmic solution 1 drop 3 (three) times daily.   dutasteride (AVODART) 0.5 MG capsule Take 1 capsule (0.5 mg total) by mouth daily.   Evolocumab (REPATHA SURECLICK) 140 MG/ML SOAJ Inject 140 mg into the skin every 14 (fourteen) days.   latanoprost (XALATAN) 0.005 % ophthalmic solution Place 1 drop into both eyes at bedtime.    losartan (COZAAR) 100 MG tablet Take 1 tablet by mouth once daily   metoprolol succinate (TOPROL-XL) 25 MG 24 hr tablet Take 1 tablet by mouth once daily   rosuvastatin (CRESTOR) 5 MG  tablet Take 1 tablet (5 mg total) by mouth every other day.   Testosterone Undecanoate (JATENZO) 158 MG CAPS Take 2 capsules (316 mg total) by mouth in the morning and at bedtime.   triamcinolone cream (KENALOG) 0.1 % Apply topically.   VITAMIN D PO Take 5,000 mcg by mouth daily.   VITAMIN E PO Take by mouth daily.    Allergies:   Patient has no known allergies.   Social History   Socioeconomic History   Marital status: Divorced    Spouse name: Not on file   Number of children: 0   Years of education: Not on file   Highest education level: Bachelor's degree (e.g., BA, AB, BS)  Occupational History   Occupation: Retired  Tobacco Use   Smoking status: Never   Smokeless tobacco: Never  Vaping Use   Vaping Use: Never used  Substance and Sexual Activity   Alcohol use: Yes    Alcohol/week: 0.0 standard drinks of alcohol    Comment: a glass of wine/beer   Drug   use: No   Sexual activity: Not Currently    Partners: Female  Other Topics Concern   Not on file  Social History Narrative   Not on file   Social Determinants of Health   Financial Resource Strain: Low Risk  (08/25/2022)   Overall Financial Resource Strain (CARDIA)    Difficulty of Paying Living Expenses: Not hard at all  Food Insecurity: No Food Insecurity (08/25/2022)   Hunger Vital Sign    Worried About Running Out of Food in the Last Year: Never true    Ran Out of Food in the Last Year: Never true  Transportation Needs: No Transportation Needs (08/25/2022)   PRAPARE - Transportation    Lack of Transportation (Medical): No    Lack of Transportation (Non-Medical): No  Physical Activity: Sufficiently Active (08/25/2022)   Exercise Vital Sign    Days of Exercise per Week: 3 days    Minutes of Exercise per Session: 60 min  Stress: No Stress Concern Present (08/25/2022)   [employer] - Occupational Stress Questionnaire    Feeling of Stress : Not at all  Social Connections: Socially Isolated  (08/25/2022)   Social Connection and Isolation Panel [NHANES]    Frequency of Communication with Friends and Family: More than three times a week    Frequency of Social Gatherings with Friends and Family: More than three times a week    Attends Religious Services: Never    Active Member of Clubs or Organizations: No    Attends Club or Organization Meetings: Never    Marital Status: Divorced     Family History:  The patient's family history includes Alcohol abuse in his father; Arthritis in his maternal grandmother; Cancer in his father; Diabetes in his mother; Heart Problems in his father; Other in his maternal aunt; Stroke in his father.  ROS:   12-point review of systems is negative unless otherwise noted in the HPI.   EKGs/Labs/Other Studies Reviewed:    Studies reviewed were summarized above. The additional studies were reviewed today:  2D echo 09/28/2018: 1. The left ventricle has normal systolic function, with an ejection  fraction of 55-60%. The cavity size was normal. Left ventricular diastolic  parameters were normal. No evidence of left ventricular regional wall  motion abnormalities.   2. The right ventricle has normal systolic function. The cavity was  normal. There is no increase in right ventricular wall thickness. Right  ventricular systolic pressure could not be assessed.   3. Left atrial size was mildly dilated.   4. Right atrial size was mildly dilated.   5. The aortic valve is tricuspid.   6. The aortic root is normal in size and structure.   7. The interatrial septum was not assessed.  __________  Zio patch 08/2018: Avg HR of 72 bpm. Normal sinus rhythm   2 Supraventricular Tachycardia/atrial tachycardia runs occurred, the run with the fastest interval lasting 10.8 secs with a max rate of 174 bpm (avg 149 bpm); the run with the fastest interval was also the longest.   Isolated SVEs were occasional (2.4%, 6579), SVE Couplets were rare (<1.0%, 1101), and SVE  Triplets were rare (<1.0%, 27).  Isolated VEs were occasional (3.5%, 9620), VE Couplets were rare (<1.0%, 10), and no VE Triplets were present.  Ventricular Trigeminy was present.  __________  Lexiscan MPI 09/15/2018: Abnormal pharmacologic myocardial perfusion stress test. There is heterogenous uptake throughout the myocardium without focal defect to suggest significant ischemia or scar. The left ventricular   ejection fraction is moderately decreased (30-44%) but may be underestimated by significant extracardiac activity. This is an intermediate risk study based on reduced LVEF. Elevated lung-heart ratio was noted, which is non-specific but can be seen with balanced ischemia. ___________  LHC 05/05/2012: Left main with mild plaque without obstructive disease, LAD with a long 70 to 75% stenosis status post PCI/DES, LCx with moderate plaquing without obstructive disease, RCA was large and dominant with 85 to 95% mid RCA stenosis that appeared to be obstructive and culprit lesion status post PCI/DES   EKG:  EKG is ordered today.  The EKG ordered today demonstrates NSR, occasional isolated PVCs, nonspecific inferolateral ST-T changes more pronounced when compared to prior tracing.  Rhythm strip shows sinus rhythm with occasional isolated PVCs.  Recent Labs: 09/17/2022: ALT 24; BUN 15; Creatinine, Ser 0.91; Potassium 3.8; Sodium 136 09/19/2022: Hemoglobin 16.0; Platelets 253  Recent Lipid Panel    Component Value Date/Time   CHOL 88 08/25/2022 1415   CHOL 136 04/14/2022 0946   TRIG 108 08/25/2022 1415   HDL 45 08/25/2022 1415   HDL 37 (L) 04/14/2022 0946   CHOLHDL 2.0 08/25/2022 1415   VLDL 25 03/12/2016 0922   LDLCALC 24 08/25/2022 1415    PHYSICAL EXAM:    VS:  BP (!) 155/72 (BP Location: Left Arm, Patient Position: Sitting, Cuff Size: Normal)   Pulse 72   Ht 6' 3.5" (1.918 m)   Wt 251 lb 12.8 oz (114.2 kg)   SpO2 99%   BMI 31.06 kg/m   BMI: Body mass index is 31.06  kg/m.  Physical Exam Vitals reviewed.  Constitutional:      Appearance: He is well-developed.  HENT:     Head: Normocephalic and atraumatic.  Eyes:     General:        Right eye: No discharge.        Left eye: No discharge.  Neck:     Vascular: No JVD.  Cardiovascular:     Rate and Rhythm: Normal rate and regular rhythm.     Pulses:          Posterior tibial pulses are 2+ on the right side and 2+ on the left side.     Heart sounds: Normal heart sounds, S1 normal and S2 normal. Heart sounds not distant. No midsystolic click and no opening snap. No murmur heard.    No friction rub.  Pulmonary:     Effort: Pulmonary effort is normal. No respiratory distress.     Breath sounds: Normal breath sounds. No decreased breath sounds, wheezing or rales.  Chest:     Chest wall: No tenderness.  Abdominal:     General: There is no distension.  Musculoskeletal:     Cervical back: Normal range of motion.     Right lower leg: No edema.     Left lower leg: No edema.  Skin:    General: Skin is warm and dry.     Nails: There is no clubbing.     Comments: Areas of mild hyperpigmentation noted along the distal aspect of right forearm.  Neurological:     Mental Status: He is alert and oriented to person, place, and time.  Psychiatric:        Speech: Speech normal.        Behavior: Behavior normal.        Thought Content: Thought content normal.        Judgment: Judgment normal.     Wt Readings from   Last 3 Encounters:  09/19/22 251 lb 12.8 oz (114.2 kg)  08/25/22 253 lb 11.2 oz (115.1 kg)  07/31/22 250 lb (113.4 kg)     ASSESSMENT & PLAN:   CAD involving the native coronary arteries with unstable angina: Currently, without symptoms of angina or cardiac decompensation.  However, he reports symptoms concerning for unstable angina with recent negative high-sensitivity troponin.  Most recent Lexiscan MPI from 2020 showed no evidence of ischemia with an elevated 3 times daily, which may be  seen in balanced ischemia.  We discussed invasive versus noninvasive cardiac imaging modalities and have ultimately elected to proceed with diagnostic LHC for definitive evaluation.  Obtain echo.  Continue aggressive risk factor modification and secondary prevention including aspirin and Repatha, losartan, metoprolol, and rosuvastatin.  Dizziness: Improved following discontinuation of pantoprazole.  Obtain echo as outlined above to evaluate for new cardiomyopathy.  PVCs: Prior outpatient cardiac monitoring has demonstrated an overall burden of 3.5%.  He continues to have isolated PVCs noted on EKG and rhythm strip today which are consistent with prior tracings.  Obtain BMP and magnesium with recommendation to replete potassium and magnesium to goal 4.0 and 2.0 respectively.  He remains on Toprol-XL.  Ischemic evaluation as outlined above.  HTN: Blood pressure is mildly elevated today, though typically well-controlled.  For now, he remains on losartan and metoprolol.  Escalate antihypertensive therapy and follow-up if indicated.  HLD: LDL 24.  Remains on Repatha and rosuvastatin.   Informed Consent   Shared Decision Making/Informed Consent{  The risks [stroke (1 in 1000), death (1 in 1000), kidney failure [usually temporary] (1 in 500), bleeding (1 in 200), allergic reaction [possibly serious] (1 in 200)], benefits (diagnostic support and management of coronary artery disease) and alternatives of a cardiac catheterization were discussed in detail with Mr. Pendergraft and he is willing to proceed.        Disposition: F/u with Dr. Gollan or an APP in 2 weeks after LHC.   Medication Adjustments/Labs and Tests Ordered: Current medicines are reviewed at length with the patient today.  Concerns regarding medicines are outlined above. Medication changes, Labs and Tests ordered today are summarized above and listed in the Patient Instructions accessible in Encounters.   Signed, Arnie Clingenpeel, PA-C 09/19/2022  4:45 PM     [facility] [place] [address] [place], [place] [address] [phone] 

## 2022-09-19 NOTE — Telephone Encounter (Signed)
2 encounters open for this same issue- see other phone encounter dated for today.

## 2022-09-19 NOTE — Telephone Encounter (Signed)
   Received a message to call Dallie Dad.  Pt states he is having intermittent dizziness and tingling in his left forearm. He was seen 2 days ago here for chest pain. Pt requesting new reflux medication. Though dizziness can be a side effect of protonix I can not for certain say this is not a new cardiac symptoms. Denies current chest pain or shortness of breath. He is not losing his balance. Recommended patient be re-evaluated.   Katha Cabal, DO 09/19/2022 1:11 PM

## 2022-09-19 NOTE — Progress Notes (Signed)
Cardiology Office Note    Date:  09/19/2022   ID:  Takashi Atallah, DOB 22-Feb-1956, MRN [id number]  PCP:  Alba Cory, MD  Cardiologist:  Julien Nordmann, MD  Electrophysiologist:  None   Chief Complaint: Urgent care follow-up  History of Present Illness:   Dean Sullivan is a 67 y.o. male with history of CAD status post remote PCI to the LAD and RCA in 04/2012 at outside hospital, DM2, HTN, HLD, obesity, and OSA on CPAP who presents for urgent care follow-up.  He underwent cardiac cath in 04/2012 at outside hospital, in the setting of an abnormal Myoview, which showed mild plaquing of the left main without obstructive disease, proximal LAD 70-75% stenosis s/p PCI/DES, moderate plaquing of the LCx without obstructive disease, large and dominant RCA with 85 to 95% mid RCA stenosis s/p PCI/DES with 10% residual stenosis, EF estimated at 55 to 60%, mild LV chamber dilation with mild global hypokinesis.    Zio patch in 08/2018 demonstrated a predominant rhythm of sinus with an average rate of 72 bpm with 2 episodes of SVT occurring with the fastest and longest interval lasting 10.8 seconds, there were occasional PACs and PVCs.  Lexiscan MPI from 08/2018 was abnormal with heterogeneous uptake throughout the myocardium without focal defect to suggest significant ischemia or scar.  LVEF estimated at 30 to 44%, though there was significant extracardiac activity that may have underestimated LVEF.  Overall, this was an intermediate risk study based on reduced LVEF.  Elevated lung-heart ratio was noted.  Echo in 09/2018 demonstrated an EF of 55 to 60%, no regional wall motion abnormalities, normal RV systolic function and ventricular cavity size, mild biatrial enlargement, tricuspid aortic valve, and normal size and structure aortic root.  He was seen at an urgent care on 09/17/2022 with a one week history of intermittent sharp left-sided chest pain with associated bloating and belching.  Alka-Seltzer was  without relief.  He was taking NSAIDs, 2 ibuprofen daily, for orthopedic pain and sciatica.  EKG tracing is not available for review.  High-sensitivity troponin of 6 at that time.  Lipase normal.  He was advised to discontinue NSAIDs and follow-up with cardiology.  Pantoprazole was prescribed.    He comes in today noting an episode of left lower chest discomfort that was associate with belching and bloating that occurred on the evening hours following working out in the yard at his mother's house.  Leading up to this event, he has been doing a fair amount of yard work and had developed some contact dermatitis on his upper extremities.  In this setting, he was treated with intramuscular, oral, and topical steroids for contact dermatitis and joint discomfort.  In this setting, he noted some intermittent tingling of the upper and lower extremities as well as some intermittent dizziness that appears to have been more pronounced following initiation of pantoprazole.  He has been without chest discomfort or belching since taking GI cocktail at the urgent care.  Occasionally, he has noted some palpitations described as a pounding sensation.  Symptoms are not reminiscent, though are not exactly similar to his prior angina.  No near-syncope or syncope.  Currently without symptoms of angina or cardiac decompensation.   Labs independently reviewed: 08/2022 - potassium 3.8, BUN 15, serum creatinine 0.91, albumin 4.4, AST/ALT normal, hemoglobin 15.6, platelet 245, TC 88, TG 108, HDL 45, LDL 24, A1c 5.7 08/2018 - TSH normal  Past Medical History:  Diagnosis Date   Allergy    Coronary arteriosclerosis  in native artery    GERD (gastroesophageal reflux disease)    occ   Glaucoma    Hearing loss of right ear    patient was told he had slight hearing loss   History of kidney stones    h/o   History of trigger finger    bilateral, but mostly in right thumb   Hyperlipidemia    Hypertension    Irregular heart beat     Sleep apnea    cpap   Type 2 diabetes mellitus (HCC) 03/18/2017   A1c 6.28 Jan 2017    Past Surgical History:  Procedure Laterality Date   CARDIAC CATHETERIZATION     x2 stents [facility]   CAROTID STENT INSERTION  2012   2   COLONOSCOPY WITH PROPOFOL N/A 04/21/2017   Procedure: COLONOSCOPY WITH PROPOFOL;  Surgeon: Wyline Mood, MD;  Location: [facility] ENDOSCOPY;  Service: Gastroenterology;  Laterality: N/A;   LIPOMA EXCISION Left 03/09/2017   Procedure: EXCISION LIPOMA;  Surgeon: Deeann Saint, MD;  Location: [facility] ORS;  Service: Orthopedics;  Laterality: Left;   PROSTATE BIOPSY  03/03/2018   RESECTION DISTAL CLAVICAL Left 03/09/2017   Procedure: RESECTION DISTAL CLAVICAL;  Surgeon: Deeann Saint, MD;  Location: [facility] ORS;  Service: Orthopedics;  Laterality: Left;   SHOULDER ARTHROSCOPY WITH OPEN ROTATOR CUFF REPAIR Left 03/09/2017   Procedure: SHOULDER ARTHROSCOPY WITH OPEN ROTATOR CUFF REPAIR;  Surgeon: Deeann Saint, MD;  Location: [facility] ORS;  Service: Orthopedics;  Laterality: Left;   SUBACROMIAL DECOMPRESSION Left 03/09/2017   Procedure: SUBACROMIAL DECOMPRESSION;  Surgeon: Deeann Saint, MD;  Location: [facility] ORS;  Service: Orthopedics;  Laterality: Left;  BICEPS TENOTOMY    Current Medications: Current Meds  Medication Sig   aspirin 81 MG tablet Take 81 mg by mouth daily.   cetirizine (ZYRTEC) 10 MG tablet Take 10 mg by mouth daily.   dorzolamide (TRUSOPT) 2 % ophthalmic solution 1 drop 3 (three) times daily.   dutasteride (AVODART) 0.5 MG capsule Take 1 capsule (0.5 mg total) by mouth daily.   Evolocumab (REPATHA SURECLICK) 140 MG/ML SOAJ Inject 140 mg into the skin every 14 (fourteen) days.   latanoprost (XALATAN) 0.005 % ophthalmic solution Place 1 drop into both eyes at bedtime.    losartan (COZAAR) 100 MG tablet Take 1 tablet by mouth once daily   metoprolol succinate (TOPROL-XL) 25 MG 24 hr tablet Take 1 tablet by mouth once daily   rosuvastatin (CRESTOR) 5 MG  tablet Take 1 tablet (5 mg total) by mouth every other day.   Testosterone Undecanoate (JATENZO) 158 MG CAPS Take 2 capsules (316 mg total) by mouth in the morning and at bedtime.   triamcinolone cream (KENALOG) 0.1 % Apply topically.   VITAMIN D PO Take 5,000 mcg by mouth daily.   VITAMIN E PO Take by mouth daily.    Allergies:   Patient has no known allergies.   Social History   Socioeconomic History   Marital status: Divorced    Spouse name: Not on file   Number of children: 0   Years of education: Not on file   Highest education level: Bachelor's degree (e.g., BA, AB, BS)  Occupational History   Occupation: Retired  Tobacco Use   Smoking status: Never   Smokeless tobacco: Never  Vaping Use   Vaping Use: Never used  Substance and Sexual Activity   Alcohol use: Yes    Alcohol/week: 0.0 standard drinks of alcohol    Comment: a glass of wine/beer   Drug  use: No   Sexual activity: Not Currently    Partners: Female  Other Topics Concern   Not on file  Social History Narrative   Not on file   Social Determinants of Health   Financial Resource Strain: Low Risk  (08/25/2022)   Overall Financial Resource Strain (CARDIA)    Difficulty of Paying Living Expenses: Not hard at all  Food Insecurity: No Food Insecurity (08/25/2022)   Hunger Vital Sign    Worried About Running Out of Food in the Last Year: Never true    Ran Out of Food in the Last Year: Never true  Transportation Needs: No Transportation Needs (08/25/2022)   PRAPARE - Administrator, Civil Service (Medical): No    Lack of Transportation (Non-Medical): No  Physical Activity: Sufficiently Active (08/25/2022)   Exercise Vital Sign    Days of Exercise per Week: 3 days    Minutes of Exercise per Session: 60 min  Stress: No Stress Concern Present (08/25/2022)   [employer] of Occupational Health - Occupational Stress Questionnaire    Feeling of Stress : Not at all  Social Connections: Socially Isolated  (08/25/2022)   Social Connection and Isolation Panel [NHANES]    Frequency of Communication with Friends and Family: More than three times a week    Frequency of Social Gatherings with Friends and Family: More than three times a week    Attends Religious Services: Never    Database administrator or Organizations: No    Attends Engineer, structural: Never    Marital Status: Divorced     Family History:  The patient's family history includes Alcohol abuse in his father; Arthritis in his maternal grandmother; Cancer in his father; Diabetes in his mother; Heart Problems in his father; Other in his maternal aunt; Stroke in his father.  ROS:   12-point review of systems is negative unless otherwise noted in the HPI.   EKGs/Labs/Other Studies Reviewed:    Studies reviewed were summarized above. The additional studies were reviewed today:  2D echo 09/28/2018: 1. The left ventricle has normal systolic function, with an ejection  fraction of 55-60%. The cavity size was normal. Left ventricular diastolic  parameters were normal. No evidence of left ventricular regional wall  motion abnormalities.   2. The right ventricle has normal systolic function. The cavity was  normal. There is no increase in right ventricular wall thickness. Right  ventricular systolic pressure could not be assessed.   3. Left atrial size was mildly dilated.   4. Right atrial size was mildly dilated.   5. The aortic valve is tricuspid.   6. The aortic root is normal in size and structure.   7. The interatrial septum was not assessed.  __________  Luci Bank patch 08/2018: Avg HR of 72 bpm. Normal sinus rhythm   2 Supraventricular Tachycardia/atrial tachycardia runs occurred, the run with the fastest interval lasting 10.8 secs with a max rate of 174 bpm (avg 149 bpm); the run with the fastest interval was also the longest.   Isolated SVEs were occasional (2.4%, 6579), SVE Couplets were rare (<1.0%, 1101), and SVE  Triplets were rare (<1.0%, 27).  Isolated VEs were occasional (3.5%, 9620), VE Couplets were rare (<1.0%, 10), and no VE Triplets were present.  Ventricular Trigeminy was present.  __________  Eugenie Birks MPI 09/15/2018: Abnormal pharmacologic myocardial perfusion stress test. There is heterogenous uptake throughout the myocardium without focal defect to suggest significant ischemia or scar. The left ventricular  ejection fraction is moderately decreased (30-44%) but may be underestimated by significant extracardiac activity. This is an intermediate risk study based on reduced LVEF. Elevated lung-heart ratio was noted, which is non-specific but can be seen with balanced ischemia. ___________  [facility] 05/05/2012: Left main with mild plaque without obstructive disease, LAD with a long 70 to 75% stenosis status post PCI/DES, LCx with moderate plaquing without obstructive disease, RCA was large and dominant with 85 to 95% mid RCA stenosis that appeared to be obstructive and culprit lesion status post PCI/DES   EKG:  EKG is ordered today.  The EKG ordered today demonstrates NSR, occasional isolated PVCs, nonspecific inferolateral ST-T changes more pronounced when compared to prior tracing.  Rhythm strip shows sinus rhythm with occasional isolated PVCs.  Recent Labs: 09/17/2022: ALT 24; BUN 15; Creatinine, Ser 0.91; Potassium 3.8; Sodium 136 09/19/2022: Hemoglobin 16.0; Platelets 253  Recent Lipid Panel    Component Value Date/Time   CHOL 88 08/25/2022 1415   CHOL 136 04/14/2022 0946   TRIG 108 08/25/2022 1415   HDL 45 08/25/2022 1415   HDL 37 (L) 04/14/2022 0946   CHOLHDL 2.0 08/25/2022 1415   VLDL 25 03/12/2016 0922   LDLCALC 24 08/25/2022 1415    PHYSICAL EXAM:    VS:  BP (!) 155/72 (BP Location: Left Arm, Patient Position: Sitting, Cuff Size: Normal)   Pulse 72   Ht 6' 3.5" (1.918 m)   Wt 251 lb 12.8 oz (114.2 kg)   SpO2 99%   BMI 31.06 kg/m   BMI: Body mass index is 31.06  kg/m.  Physical Exam Vitals reviewed.  Constitutional:      Appearance: He is well-developed.  HENT:     Head: Normocephalic and atraumatic.  Eyes:     General:        Right eye: No discharge.        Left eye: No discharge.  Neck:     Vascular: No JVD.  Cardiovascular:     Rate and Rhythm: Normal rate and regular rhythm.     Pulses:          Posterior tibial pulses are 2+ on the right side and 2+ on the left side.     Heart sounds: Normal heart sounds, S1 normal and S2 normal. Heart sounds not distant. No midsystolic click and no opening snap. No murmur heard.    No friction rub.  Pulmonary:     Effort: Pulmonary effort is normal. No respiratory distress.     Breath sounds: Normal breath sounds. No decreased breath sounds, wheezing or rales.  Chest:     Chest wall: No tenderness.  Abdominal:     General: There is no distension.  Musculoskeletal:     Cervical back: Normal range of motion.     Right lower leg: No edema.     Left lower leg: No edema.  Skin:    General: Skin is warm and dry.     Nails: There is no clubbing.     Comments: Areas of mild hyperpigmentation noted along the distal aspect of right forearm.  Neurological:     Mental Status: He is alert and oriented to person, place, and time.  Psychiatric:        Speech: Speech normal.        Behavior: Behavior normal.        Thought Content: Thought content normal.        Judgment: Judgment normal.     Wt Readings from  Last 3 Encounters:  09/19/22 251 lb 12.8 oz (114.2 kg)  08/25/22 253 lb 11.2 oz (115.1 kg)  07/31/22 250 lb (113.4 kg)     ASSESSMENT & PLAN:   CAD involving the native coronary arteries with unstable angina: Currently, without symptoms of angina or cardiac decompensation.  However, he reports symptoms concerning for unstable angina with recent negative high-sensitivity troponin.  Most recent Lexiscan MPI from 2020 showed no evidence of ischemia with an elevated 3 times daily, which may be  seen in balanced ischemia.  We discussed invasive versus noninvasive cardiac imaging modalities and have ultimately elected to proceed with diagnostic LHC for definitive evaluation.  Obtain echo.  Continue aggressive risk factor modification and secondary prevention including aspirin and Repatha, losartan, metoprolol, and rosuvastatin.  Dizziness: Improved following discontinuation of pantoprazole.  Obtain echo as outlined above to evaluate for new cardiomyopathy.  PVCs: Prior outpatient cardiac monitoring has demonstrated an overall burden of 3.5%.  He continues to have isolated PVCs noted on EKG and rhythm strip today which are consistent with prior tracings.  Obtain BMP and magnesium with recommendation to replete potassium and magnesium to goal 4.0 and 2.0 respectively.  He remains on Toprol-XL.  Ischemic evaluation as outlined above.  HTN: Blood pressure is mildly elevated today, though typically well-controlled.  For now, he remains on losartan and metoprolol.  Escalate antihypertensive therapy and follow-up if indicated.  HLD: LDL 24.  Remains on Repatha and rosuvastatin.   Informed Consent   Shared Decision Making/Informed Consent{  The risks [stroke (1 in 1000), death (1 in 1000), kidney failure [usually temporary] (1 in 500), bleeding (1 in 200), allergic reaction [possibly serious] (1 in 200)], benefits (diagnostic support and management of coronary artery disease) and alternatives of a cardiac catheterization were discussed in detail with Mr. Neuwirth and he is willing to proceed.        Disposition: F/u with Dr. Mariah Milling or an APP in 2 weeks after LHC.   Medication Adjustments/Labs and Tests Ordered: Current medicines are reviewed at length with the patient today.  Concerns regarding medicines are outlined above. Medication changes, Labs and Tests ordered today are summarized above and listed in the Patient Instructions accessible in Encounters.   Signed, Eula Listen, PA-C 09/19/2022  4:45 PM     [facility] [place] [address] [place], [place] [address] [phone]

## 2022-09-19 NOTE — Telephone Encounter (Signed)
This RN was notified by the front desk staff that the patient had walked in complaining of cardiac symptoms.  I assessed the patient at the front desk.  He advised that he had been having symptoms chest pain/ gas that was intermittent/ sharp in nature.  He had called the office on 09/17/22 when his symptoms were first reported and advised by nursing that symptoms appeared GI in nature.  He did end up being seen at the [facility].  EKG done at that time with PA impression as stated below: Date/Time: 09/17/2022 3:10 PM   Performed by: Shirlee Latch, PA-C Authorized by: Shirlee Latch, PA-C   Previous ECG:    Previous ECG:  Compared to current   Similarity:  Changes noted   Comparison ECG info:  EKG from 04/14/22 shows sinus rhythm with occasional PVCs. Today there are changes in V1 and V2 T waves Interpretation:    Interpretation: abnormal   Rate:    ECG rate:  70   ECG rate assessment: normal   Rhythm:    Rhythm: sinus rhythm   Ectopy:    Ectopy: PVCs   QRS:    QRS axis:  Normal   QRS intervals:  Normal   QRS conduction: normal   ST segments:    ST segments:  Normal T waves:    T waves: non-specific     Details:  Flat T wave in V2 and inverted T wave in V1 Comments:     Normal sinus rhythm with occasional PVCs.  Flat T wave in V2 and inverted T wave in V1.   Lab work included a troponin x 1 that was WNL.   He was given a GI cocktail and advised to follow up with cardiology. Per the patient, he has not had any chest pain today, but some intermittent dizziness and tingling in his arms.  Symptoms are not similar as prior to previous stent placement.  The patient is in no acute distress- no active symptoms.   He has been offered a same day appointment with Eula Listen, PA at 2:45 pm this afternoon and is agreeable.  The patient is advised to remain in our lobby until the time of the appointment. He voices understanding and is agreeable.

## 2022-09-19 NOTE — Telephone Encounter (Signed)
STAT if patient feels like he/she is going to faint   Are you dizzy now? yes  Do you feel faint or have you passed out? No   Do you have any other symptoms? Previously had cp and head pressure lethargic  Have you checked your HR and BP (record if available)? no

## 2022-09-22 ENCOUNTER — Telehealth: Payer: Self-pay | Admitting: *Deleted

## 2022-09-22 NOTE — Telephone Encounter (Signed)
I spoke with pt this am concerning date and time of cardiac cath. Pt is aware and agreed to cardiac procedure.  09/23/2022 @ 10:30 am. Pt is aware that he needs to be there at that time. Pt aware that pre cath labs were stable.

## 2022-09-23 ENCOUNTER — Ambulatory Visit
Admission: RE | Admit: 2022-09-23 | Discharge: 2022-09-23 | Disposition: A | Discharge status: Home / Self Care | Payer: Medicare PPO | Source: Ambulatory Visit | Attending: Internal Medicine | Admitting: Internal Medicine

## 2022-09-23 ENCOUNTER — Encounter: Payer: Self-pay | Admitting: Internal Medicine

## 2022-09-23 ENCOUNTER — Encounter
Admission: RE | Disposition: A | Discharge status: Home / Self Care | Payer: Self-pay | Source: Ambulatory Visit | Attending: Internal Medicine

## 2022-09-23 ENCOUNTER — Other Ambulatory Visit: Payer: Self-pay

## 2022-09-23 DIAGNOSIS — E669 Obesity, unspecified: Secondary | ICD-10-CM | POA: Insufficient documentation

## 2022-09-23 DIAGNOSIS — T82855A Stenosis of coronary artery stent, initial encounter: Secondary | ICD-10-CM | POA: Diagnosis not present

## 2022-09-23 DIAGNOSIS — I493 Ventricular premature depolarization: Secondary | ICD-10-CM | POA: Diagnosis not present

## 2022-09-23 DIAGNOSIS — I25110 Atherosclerotic heart disease of native coronary artery with unstable angina pectoris: Secondary | ICD-10-CM | POA: Insufficient documentation

## 2022-09-23 DIAGNOSIS — E785 Hyperlipidemia, unspecified: Secondary | ICD-10-CM | POA: Diagnosis not present

## 2022-09-23 DIAGNOSIS — Y832 Surgical operation with anastomosis, bypass or graft as the cause of abnormal reaction of the patient, or of later complication, without mention of misadventure at the time of the procedure: Secondary | ICD-10-CM | POA: Insufficient documentation

## 2022-09-23 DIAGNOSIS — R42 Dizziness and giddiness: Secondary | ICD-10-CM | POA: Diagnosis not present

## 2022-09-23 DIAGNOSIS — E119 Type 2 diabetes mellitus without complications: Secondary | ICD-10-CM | POA: Diagnosis not present

## 2022-09-23 DIAGNOSIS — G4733 Obstructive sleep apnea (adult) (pediatric): Secondary | ICD-10-CM | POA: Insufficient documentation

## 2022-09-23 DIAGNOSIS — Z79899 Other long term (current) drug therapy: Secondary | ICD-10-CM | POA: Diagnosis not present

## 2022-09-23 DIAGNOSIS — Z955 Presence of coronary angioplasty implant and graft: Secondary | ICD-10-CM | POA: Diagnosis not present

## 2022-09-23 DIAGNOSIS — I200 Unstable angina: Secondary | ICD-10-CM | POA: Insufficient documentation

## 2022-09-23 DIAGNOSIS — Z6831 Body mass index (BMI) 31.0-31.9, adult: Secondary | ICD-10-CM | POA: Insufficient documentation

## 2022-09-23 DIAGNOSIS — I10 Essential (primary) hypertension: Secondary | ICD-10-CM | POA: Insufficient documentation

## 2022-09-23 HISTORY — PX: LEFT HEART CATH AND CORONARY ANGIOGRAPHY: CATH118249

## 2022-09-23 SURGERY — LEFT HEART CATH AND CORONARY ANGIOGRAPHY
Anesthesia: Moderate Sedation | Laterality: Left

## 2022-09-23 MED ORDER — SODIUM CHLORIDE 0.9% FLUSH: 3.0000 mL | INTRAVENOUS | Status: DC | PRN

## 2022-09-23 MED ORDER — LABETALOL HCL 5 MG/ML IV SOLN: 10.0000 mg | INTRAVENOUS | Status: DC | PRN

## 2022-09-23 MED ORDER — SODIUM CHLORIDE 0.9% FLUSH: 3.0000 mL | Freq: Two times a day (BID) | INTRAVENOUS | Status: DC

## 2022-09-23 MED ORDER — HEPARIN (PORCINE) IN NACL 2000-0.9 UNIT/L-% IV SOLN
INTRAVENOUS | Status: DC | PRN
  Administered 2022-09-23: 1000 mL

## 2022-09-23 MED ORDER — ONDANSETRON HCL 4 MG/2ML IJ SOLN: 4.0000 mg | Freq: Four times a day (QID) | INTRAMUSCULAR | Status: DC | PRN

## 2022-09-23 MED ORDER — SODIUM CHLORIDE 0.9 % WEIGHT BASED INFUSION
1.0000 mL/kg/h | INTRAVENOUS | Status: DC
  Administered 2022-09-23: 1 mL/kg/h via INTRAVENOUS

## 2022-09-23 MED ORDER — MIDAZOLAM HCL 2 MG/2ML IJ SOLN
INTRAMUSCULAR | Status: AC
  Filled 2022-09-23: qty 2

## 2022-09-23 MED ORDER — SODIUM CHLORIDE 0.9 % IV SOLN: 250.0000 mL | INTRAVENOUS | Status: DC | PRN

## 2022-09-23 MED ORDER — ASPIRIN 81 MG PO CHEW: 81.0000 mg | CHEWABLE_TABLET | ORAL | Status: DC

## 2022-09-23 MED ORDER — HEPARIN (PORCINE) IN NACL 1000-0.9 UT/500ML-% IV SOLN
INTRAVENOUS | Status: AC
  Filled 2022-09-23: qty 1000

## 2022-09-23 MED ORDER — VERAPAMIL HCL 2.5 MG/ML IV SOLN
INTRAVENOUS | Status: DC | PRN
  Administered 2022-09-23 (×2): 2.5 mg via INTRAVENOUS

## 2022-09-23 MED ORDER — FENTANYL CITRATE (PF) 100 MCG/2ML IJ SOLN
INTRAMUSCULAR | Status: AC
  Filled 2022-09-23: qty 2

## 2022-09-23 MED ORDER — MIDAZOLAM HCL 2 MG/2ML IJ SOLN
INTRAMUSCULAR | Status: DC | PRN
  Administered 2022-09-23 (×2): 1 mg via INTRAVENOUS

## 2022-09-23 MED ORDER — NITROGLYCERIN 0.4 MG SL SUBL: 0.4000 mg | SUBLINGUAL_TABLET | SUBLINGUAL | 99 refills | Status: AC | PRN

## 2022-09-23 MED ORDER — LIDOCAINE HCL 1 % IJ SOLN
INTRAMUSCULAR | Status: AC
  Filled 2022-09-23: qty 20

## 2022-09-23 MED ORDER — ACETAMINOPHEN 325 MG PO TABS: 650.0000 mg | ORAL_TABLET | ORAL | Status: DC | PRN

## 2022-09-23 MED ORDER — SODIUM CHLORIDE 0.9 % WEIGHT BASED INFUSION
3.0000 mL/kg/h | INTRAVENOUS | Status: AC
  Administered 2022-09-23: 3 mL/kg/h via INTRAVENOUS

## 2022-09-23 MED ORDER — SODIUM CHLORIDE 0.9 % IV SOLN: INTRAVENOUS | Status: DC

## 2022-09-23 MED ORDER — VITAMIN D 125 MCG (5000 UT) PO CAPS: 5000.0000 ug | ORAL_CAPSULE | Freq: Every day | ORAL | Status: AC

## 2022-09-23 MED ORDER — HEPARIN SODIUM (PORCINE) 1000 UNIT/ML IJ SOLN
INTRAMUSCULAR | Status: AC
  Filled 2022-09-23: qty 10

## 2022-09-23 MED ORDER — VERAPAMIL HCL 2.5 MG/ML IV SOLN
INTRAVENOUS | Status: AC
  Filled 2022-09-23: qty 2

## 2022-09-23 MED ORDER — IOHEXOL 300 MG/ML  SOLN
INTRAMUSCULAR | Status: DC | PRN
  Administered 2022-09-23: 55 mL

## 2022-09-23 MED ORDER — HYDRALAZINE HCL 20 MG/ML IJ SOLN: 10.0000 mg | INTRAMUSCULAR | Status: DC | PRN

## 2022-09-23 MED ORDER — FENTANYL CITRATE (PF) 100 MCG/2ML IJ SOLN
INTRAMUSCULAR | Status: DC | PRN
  Administered 2022-09-23: 50 ug via INTRAVENOUS

## 2022-09-23 MED ORDER — HEPARIN SODIUM (PORCINE) 1000 UNIT/ML IJ SOLN
INTRAMUSCULAR | Status: DC | PRN
  Administered 2022-09-23: 5000 [IU] via INTRAVENOUS

## 2022-09-23 SURGICAL SUPPLY — 10 items
CATH 5FR JL3.5 JR4 ANG PIG MP (CATHETERS) IMPLANT
DEVICE RAD TR BAND REGULAR (VASCULAR PRODUCTS) IMPLANT
DRAPE BRACHIAL (DRAPES) IMPLANT
GLIDESHEATH SLEND SS 6F .021 (SHEATH) IMPLANT
GUIDEWIRE INQWIRE 1.5J.035X260 (WIRE) IMPLANT
INQWIRE 1.5J .035X260CM (WIRE) ×1
PACK CARDIAC CATH (CUSTOM PROCEDURE TRAY) ×2 IMPLANT
PROTECTION STATION PRESSURIZED (MISCELLANEOUS) ×1
SET ATX-X65L (MISCELLANEOUS) IMPLANT
STATION PROTECTION PRESSURIZED (MISCELLANEOUS) IMPLANT

## 2022-09-23 NOTE — Interval H&P Note (Signed)
History and Physical Interval Note:  09/23/2022 1:55 PM  Dean Sullivan  has presented today for surgery, with the diagnosis of unstable angina.  The various methods of treatment have been discussed with the patient and family. After consideration of risks, benefits and other options for treatment, the patient has consented to  Procedure(s): LEFT HEART CATH AND CORONARY ANGIOGRAPHY (Left) as a surgical intervention.  The patient's history has been reviewed, patient examined, no change in status, stable for surgery.  I have reviewed the patient's chart and labs.  Questions were answered to the patient's satisfaction.    Cath Lab Visit (complete for each Cath Lab visit)  Clinical Evaluation Leading to the Procedure:   ACS: No.  Non-ACS:    Anginal Classification: CCS IV  Anti-ischemic medical therapy: Minimal Therapy (1 class of medications)  Non-Invasive Test Results: No non-invasive testing performed  Prior CABG: No previous CABG  Dean Sullivan

## 2022-09-23 NOTE — Brief Op Note (Signed)
BRIEF CARDIAC CATHETERIZATION NOTE  09/23/2022  2:23 PM  PATIENT:  Dean Sullivan  67 y.o. male  PRE-OPERATIVE DIAGNOSIS:  Unstable angina  POST-OPERATIVE DIAGNOSIS:  Same  PROCEDURE:  Procedure(s): LEFT HEART CATH AND CORONARY ANGIOGRAPHY (Left)  SURGEON:  Surgeon(s) and Role:    * Oryan Winterton, Cristal Deer, MD - Primary  FINDINGS: 60% proximal/mid LCx stenosis. 80% distal rPDA stenosis. Widely patent LAD stent. Low normal LVEF (50-55%) with normal filling pressure.  RECOMMENDATIONS: Continue secondary prevention of CAD. Proceed with echo as planned.  Yvonne Kendall, MD [facility]

## 2022-09-24 ENCOUNTER — Encounter: Payer: Self-pay | Admitting: Internal Medicine

## 2022-10-02 ENCOUNTER — Telehealth: Payer: Self-pay | Admitting: Urology

## 2022-10-02 ENCOUNTER — Other Ambulatory Visit: Payer: Self-pay | Admitting: Urology

## 2022-10-02 NOTE — Telephone Encounter (Signed)
Sent refill request to Dr. Lonna Cobb

## 2022-10-02 NOTE — Telephone Encounter (Signed)
Patient called regarding refill request he sent earlier for Jatenzo 158 mg caps. He would like it sent [employer]; phone # [phone]. Previous request had listed [facility], and he does not want it sent there.

## 2022-10-03 MED ORDER — JATENZO 158 MG PO CAPS: 316.0000 mg | ORAL_CAPSULE | Freq: Two times a day (BID) | ORAL | 0 refills | Status: DC

## 2022-10-13 ENCOUNTER — Ambulatory Visit: Payer: Medicare PPO | Attending: Physician Assistant

## 2022-10-13 DIAGNOSIS — I25110 Atherosclerotic heart disease of native coronary artery with unstable angina pectoris: Secondary | ICD-10-CM

## 2022-10-13 LAB — ECHOCARDIOGRAM COMPLETE
Area-P 1/2: 3.53 cm2
S' Lateral: 3.5 cm

## 2022-10-14 ENCOUNTER — Ambulatory Visit: Payer: Self-pay

## 2022-10-14 NOTE — Telephone Encounter (Signed)
      Chief Complaint: Cramps, tingling to both calves. Has cramps in neck. Symptoms: Above Frequency: 10 days ago Pertinent Negatives: Patient denies  Disposition: [] ED /[] Urgent Care (no appt availability in office) / [x] Appointment(In office/virtual)/ []  [facility]/ [] Home Care/ [] Refused Recommended Disposition /[] [place] [facility]/ []  Follow-up with PCP Additional Notes: Pt. Agrees with appointment.  Reason for Disposition  [1] MODERATE pain (e.g., interferes with normal activities, limping) AND [2] present > 3 days  Answer Assessment - Initial Assessment Questions 1. ONSET: "When did the pain start?"      Tingling started 10 days ago 2. LOCATION: "Where is the pain located?"      Both calves 3. PAIN: "How bad is the pain?"    (Scale 1-10; or mild, moderate, severe)   -  MILD (1-3): doesn't interfere with normal activities    -  MODERATE (4-7): interferes with normal activities (e.g., work or school) or awakens from sleep, limping    -  SEVERE (8-10): excruciating pain, unable to do any normal activities, unable to walk     Mild 4. WORK OR EXERCISE: "Has there been any recent work or exercise that involved this part of the body?"      No 5. CAUSE: "What do you think is causing the leg pain?"     Sciatica 6. OTHER SYMPTOMS: "Do you have any other symptoms?" (e.g., chest pain, back pain, breathing difficulty, swelling, rash, fever, numbness, weakness)     Tingling to neck and arms 7. PREGNANCY: "Is there any chance you are pregnant?" "When was your last menstrual period?"     N/a  Protocols used: Leg Pain-A-AH

## 2022-10-15 ENCOUNTER — Ambulatory Visit (INDEPENDENT_AMBULATORY_CARE_PROVIDER_SITE_OTHER): Payer: Medicare PPO | Admitting: Nurse Practitioner

## 2022-10-15 VITALS — BP 136/78 | HR 103 | Temp 98.3°F | Resp 18 | Ht 75.0 in | Wt 248.2 lb

## 2022-10-15 DIAGNOSIS — R252 Cramp and spasm: Secondary | ICD-10-CM | POA: Diagnosis not present

## 2022-10-15 DIAGNOSIS — R202 Paresthesia of skin: Secondary | ICD-10-CM | POA: Diagnosis not present

## 2022-10-15 LAB — CBC WITH DIFFERENTIAL/PLATELET
Absolute Monocytes: 413 cells/uL (ref 200–950)
Basophils Relative: 1.1 %
Eosinophils Absolute: 90 cells/uL (ref 15–500)
HCT: 48.1 % (ref 38.5–50.0)
Hemoglobin: 15.9 g/dL (ref 13.2–17.1)
MCH: 28.8 pg (ref 27.0–33.0)
MCHC: 33.1 g/dL (ref 32.0–36.0)
MCV: 87 fL (ref 80.0–100.0)
MPV: 10.6 fL (ref 7.5–12.5)
Monocytes Relative: 7.8 %
Neutro Abs: 2523 cells/uL (ref 1500–7800)
Neutrophils Relative %: 47.6 %
Platelets: 240 10*3/uL (ref 140–400)
RBC: 5.53 10*6/uL (ref 4.20–5.80)
Total Lymphocyte: 41.8 %
WBC: 5.3 10*3/uL (ref 3.8–10.8)

## 2022-10-15 NOTE — Progress Notes (Signed)
BP 136/78   Pulse (!) 103   Temp 98.3 F (36.8 C)   Resp 18   Ht 6\' 3"  (1.905 m)   Wt 248 lb 3.2 oz (112.6 kg)   SpO2 97%   BMI 31.02 kg/m    Subjective:    Patient ID: Dean Sullivan, male    DOB: [birth year], 67 y.o.   MRN: [id number]  HPI: Dean Sullivan is a 67 y.o. male  Chief Complaint  Patient presents with   cramps   Tingling and cramping in arms in legs: he says that this started a couple weeks ago.  He says that it comes and goes.  He says it happens in his arms and legs.  He says that he has been taking mustard, drinking Gatorade, water, banana. He says it does help but it is a temporary fix.  He has recently restarted rosuvastatin. Will get labs.    Relevant past medical, surgical, family and social history reviewed and updated as indicated. Interim medical history since our last visit reviewed. Allergies and medications reviewed and updated.  Review of Systems  Constitutional: Negative for fever or weight change.  Respiratory: Negative for cough and shortness of breath.   Cardiovascular: Negative for chest pain or palpitations.  Gastrointestinal: Negative for abdominal pain, no bowel changes.  Musculoskeletal: Negative for gait problem or joint swelling. Positive for muscle cramps and tingling Skin: Negative for rash.  Neurological: Negative for dizziness or headache.  No other specific complaints in a complete review of systems (except as listed in HPI above).      Objective:    BP 136/78   Pulse (!) 103   Temp 98.3 F (36.8 C)   Resp 18   Ht 6\' 3"  (1.905 m)   Wt 248 lb 3.2 oz (112.6 kg)   SpO2 97%   BMI 31.02 kg/m   Wt Readings from Last 3 Encounters:  10/15/22 248 lb 3.2 oz (112.6 kg)  09/23/22 250 lb (113.4 kg)  09/19/22 251 lb 12.8 oz (114.2 kg)    Physical Exam  Constitutional: Patient appears well-developed and well-nourished. Obese  No distress.  HEENT: head atraumatic, normocephalic, pupils equal and reactive to light, neck supple,  throat within normal limits Cardiovascular: Normal rate, regular rhythm and normal heart sounds.  No murmur heard. No BLE edema. Pulmonary/Chest: Effort normal and breath sounds normal. No respiratory distress. Abdominal: Soft.  There is no tenderness. Psychiatric: Patient has a normal mood and affect. behavior is normal. Judgment and thought content normal.   Results for orders placed or performed in visit on 10/13/22  ECHOCARDIOGRAM COMPLETE  Result Value Ref Range   S' Lateral 3.50 cm   Area-P 1/2 3.53 cm2   Est EF 60 - 65%       Assessment & Plan:   Problem List Items Addressed This Visit   None Visit Diagnoses     Tingling in extremities    -  Primary   getting labs   Relevant Orders   CK (Creatine Kinase)   Magnesium   CBC with Differential/Platelet   COMPLETE METABOLIC PANEL WITH GFR   Iron, TIBC and Ferritin Panel   Vitamin B12   VITAMIN D 25 Hydroxy (Vit-D Deficiency, Fractures)   Muscle cramps       getting labs   Relevant Orders   CK (Creatine Kinase)   Magnesium   CBC with Differential/Platelet   COMPLETE METABOLIC PANEL WITH GFR   Iron, TIBC and Ferritin Panel   Vitamin  B12   VITAMIN D 25 Hydroxy (Vit-D Deficiency, Fractures)        Follow up plan: Return if symptoms worsen or fail to improve.

## 2022-10-16 ENCOUNTER — Other Ambulatory Visit: Payer: Self-pay | Admitting: Nurse Practitioner

## 2022-10-16 DIAGNOSIS — R748 Abnormal levels of other serum enzymes: Secondary | ICD-10-CM

## 2022-10-16 LAB — IRON,TIBC AND FERRITIN PANEL
%SAT: 44 % (calc) (ref 20–48)
Ferritin: 219 ng/mL (ref 24–380)
Iron: 156 ug/dL (ref 50–180)
TIBC: 355 mcg/dL (calc) (ref 250–425)

## 2022-10-16 LAB — COMPLETE METABOLIC PANEL WITH GFR
AST: 19 U/L (ref 10–35)
Albumin: 4.8 g/dL (ref 3.6–5.1)
Alkaline phosphatase (APISO): 55 U/L (ref 35–144)
Calcium: 9.6 mg/dL (ref 8.6–10.3)
Chloride: 105 mmol/L (ref 98–110)
Creat: 1.06 mg/dL (ref 0.70–1.35)
Globulin: 2.8 g/dL (calc) (ref 1.9–3.7)
Glucose, Bld: 125 mg/dL — ABNORMAL HIGH (ref 65–99)
Potassium: 3.9 mmol/L (ref 3.5–5.3)
Sodium: 140 mmol/L (ref 135–146)
Total Bilirubin: 1.1 mg/dL (ref 0.2–1.2)
Total Protein: 7.6 g/dL (ref 6.1–8.1)
eGFR: 78 mL/min/{1.73_m2} (ref >=60)

## 2022-10-16 LAB — CBC WITH DIFFERENTIAL/PLATELET
Basophils Absolute: 58 cells/uL (ref 0–200)
Eosinophils Relative: 1.7 %
Lymphs Abs: 2215 cells/uL (ref 850–3900)
RDW: 13.8 % (ref 11.0–15.0)

## 2022-10-16 LAB — CK: Total CK: 527 U/L — ABNORMAL HIGH (ref 44–196)

## 2022-10-16 LAB — VITAMIN D 25 HYDROXY (VIT D DEFICIENCY, FRACTURES): Vit D, 25-Hydroxy: 46 ng/mL (ref 30–100)

## 2022-10-16 LAB — VITAMIN B12: Vitamin B-12: 494 pg/mL (ref 200–1100)

## 2022-10-16 LAB — MAGNESIUM: Magnesium: 2.2 mg/dL (ref 1.5–2.5)

## 2022-10-24 DIAGNOSIS — H401131 Primary open-angle glaucoma, bilateral, mild stage: Secondary | ICD-10-CM | POA: Diagnosis not present

## 2022-10-29 ENCOUNTER — Encounter: Payer: Self-pay | Admitting: Nurse Practitioner

## 2022-11-05 NOTE — Progress Notes (Unsigned)
Patient: Dean Sullivan, Male    DOB: 08-20-1955, 67 y.o.   MRN: [id number]  Visit Date: 11/06/2022  Today's Provider: Ruel Favors, MD   Welcome to Medicare  Subjective:   Dean Sullivan is a 67 y.o. male who presents today for his Subsequent Annual Wellness Visit.  Patient/Caregiver input:  leg cramps at night, reviewed labs all normal except for CK. Advised to take tizanidine at night, increase water intake and also gatorade   HPI  IPSS Questionnaire (AUA-7): Over the past month.   1)  How often have you had a sensation of not emptying your bladder completely after you finish urinating?  5 - Almost always  2)  How often have you had to urinate again less than two hours after you finished urinating? 5 - Almost always  3)  How often have you found you stopped and started again several times when you urinated?  0 - Not at all  4) How difficult have you found it to postpone urination?  1 - Less than 1 time in 5  5) How often have you had a weak urinary stream?  0 - Not at all  6) How often have you had to push or strain to begin urination?  0 - Not at all  7) How many times did you most typically get up to urinate from the time you went to bed until the time you got up in the morning?  3 - 3 times  Total score:  0-7 mildly symptomatic   8-19 moderately symptomatic   20-35 severely symptomatic     Review of Systems  Constitutional: Negative for fever or weight change.  Respiratory: Negative for cough and shortness of breath.   Cardiovascular: Negative for chest pain or palpitations.  Gastrointestinal: Negative for abdominal pain, no bowel changes.  Musculoskeletal: Negative for gait problem or joint swelling.  Skin: Negative for rash.  Neurological: Negative for dizziness or headache.  No other specific complaints in a complete review of systems (except as listed in HPI above).  Past Medical History:  Diagnosis Date   Allergy    Coronary arteriosclerosis in native artery     GERD (gastroesophageal reflux disease)    occ   Glaucoma    Hearing loss of right ear    patient was told he had slight hearing loss   History of kidney stones    h/o   History of trigger finger    bilateral, but mostly in right thumb   Hyperlipidemia    Hypertension    Irregular heart beat    Sleep apnea    cpap   Type 2 diabetes mellitus (HCC) 03/18/2017   A1c 6.28 Jan 2017    Past Surgical History:  Procedure Laterality Date   CARDIAC CATHETERIZATION     x2 stents [facility]   CAROTID STENT INSERTION  2012   2   COLONOSCOPY WITH PROPOFOL N/A 04/21/2017   Procedure: COLONOSCOPY WITH PROPOFOL;  Surgeon: Wyline Mood, MD;  Location: [facility] ENDOSCOPY;  Service: Gastroenterology;  Laterality: N/A;   LEFT HEART CATH AND CORONARY ANGIOGRAPHY Left 09/23/2022   Procedure: LEFT HEART CATH AND CORONARY ANGIOGRAPHY;  Surgeon: Yvonne Kendall, MD;  Location: [facility] INVASIVE CV LAB;  Service: Cardiovascular;  Laterality: Left;   LIPOMA EXCISION Left 03/09/2017   Procedure: EXCISION LIPOMA;  Surgeon: Deeann Saint, MD;  Location: [facility] ORS;  Service: Orthopedics;  Laterality: Left;   PROSTATE BIOPSY  03/03/2018   RESECTION DISTAL CLAVICAL Left 03/09/2017  Procedure: RESECTION DISTAL CLAVICAL;  Surgeon: Deeann Saint, MD;  Location: [facility] ORS;  Service: Orthopedics;  Laterality: Left;   SHOULDER ARTHROSCOPY WITH OPEN ROTATOR CUFF REPAIR Left 03/09/2017   Procedure: SHOULDER ARTHROSCOPY WITH OPEN ROTATOR CUFF REPAIR;  Surgeon: Deeann Saint, MD;  Location: [facility] ORS;  Service: Orthopedics;  Laterality: Left;   SUBACROMIAL DECOMPRESSION Left 03/09/2017   Procedure: SUBACROMIAL DECOMPRESSION;  Surgeon: Deeann Saint, MD;  Location: [facility] ORS;  Service: Orthopedics;  Laterality: Left;  BICEPS TENOTOMY    Family History  Problem Relation Age of Onset   Diabetes Mother        adult onset - geriatric   Alcohol abuse Father    Cancer Father        prostate   Stroke Father        mini  strokes   Heart Problems Father    Other Maternal Aunt        born with her lung outside of her body; lived until 84.   Arthritis Maternal Grandmother     Social History   Socioeconomic History   Marital status: Divorced    Spouse name: Not on file   Number of children: 0   Years of education: Not on file   Highest education level: Bachelor's degree (e.g., BA, AB, BS)  Occupational History   Occupation: Retired  Tobacco Use   Smoking status: Never   Smokeless tobacco: Never  Vaping Use   Vaping status: Never Used  Substance and Sexual Activity   Alcohol use: Yes    Alcohol/week: 0.0 standard drinks of alcohol    Comment: a glass of wine/beer   Drug use: No   Sexual activity: Not Currently    Partners: Female  Other Topics Concern   Not on file  Social History Narrative   Not on file   Social Determinants of Health   Financial Resource Strain: Low Risk  (10/15/2022)   Overall Financial Resource Strain (CARDIA)    Difficulty of Paying Living Expenses: Not hard at all  Food Insecurity: No Food Insecurity (10/15/2022)   Hunger Vital Sign    Worried About Running Out of Food in the Last Year: Never true    Ran Out of Food in the Last Year: Never true  Transportation Needs: No Transportation Needs (10/15/2022)   PRAPARE - Administrator, Civil Service (Medical): No    Lack of Transportation (Non-Medical): No  Physical Activity: Sufficiently Active (10/15/2022)   Exercise Vital Sign    Days of Exercise per Week: 3 days    Minutes of Exercise per Session: 50 min  Stress: No Stress Concern Present (10/15/2022)   [employer] of Occupational Health - Occupational Stress Questionnaire    Feeling of Stress : Only a little  Social Connections: Unknown (10/15/2022)   Social Connection and Isolation Panel [NHANES]    Frequency of Communication with Friends and Family: More than three times a week    Frequency of Social Gatherings with Friends and Family: Three  times a week    Attends Religious Services: Patient declined    Active Member of Clubs or Organizations: Yes    Attends Banker Meetings: Never    Marital Status: Divorced  Recent Concern: Social Connections - Socially Isolated (08/25/2022)   Social Connection and Isolation Panel [NHANES]    Frequency of Communication with Friends and Family: More than three times a week    Frequency of Social Gatherings with Friends and Family: More than three  times a week    Attends Religious Services: Never    Active Member of Clubs or Organizations: No    Attends Banker Meetings: Never    Marital Status: Divorced  Catering manager Violence: Not At Risk (08/25/2022)   Humiliation, Afraid, Rape, and Kick questionnaire    Fear of Current or Ex-Partner: No    Emotionally Abused: No    Physically Abused: No    Sexually Abused: No    Outpatient Encounter Medications as of 11/06/2022  Medication Sig   aspirin 81 MG tablet Take 81 mg by mouth daily.   cetirizine (ZYRTEC) 10 MG tablet Take 10 mg by mouth daily.   Cholecalciferol (VITAMIN D) 125 MCG (5000 UT) CAPS Take 5,000 mcg by mouth daily.   cyanocobalamin (VITAMIN B12) 1000 MCG tablet Take 1,000 mcg by mouth daily.   dorzolamide (TRUSOPT) 2 % ophthalmic solution 1 drop 3 (three) times daily.   dutasteride (AVODART) 0.5 MG capsule Take 1 capsule (0.5 mg total) by mouth daily.   Evolocumab (REPATHA SURECLICK) 140 MG/ML SOAJ Inject 140 mg into the skin every 14 (fourteen) days.   latanoprost (XALATAN) 0.005 % ophthalmic solution Place 1 drop into both eyes at bedtime.    losartan (COZAAR) 100 MG tablet Take 1 tablet by mouth once daily   MAGNESIUM PO Take 1 capsule by mouth daily.   metoprolol succinate (TOPROL-XL) 25 MG 24 hr tablet Take 1 tablet by mouth once daily   Testosterone Undecanoate (JATENZO) 158 MG CAPS Take 2 capsules (316 mg total) by mouth in the morning and at bedtime.   VITAMIN E PO Take by mouth daily.    nitroGLYCERIN (NITROSTAT) 0.4 MG SL tablet Place 1 tablet (0.4 mg total) under the tongue every 5 (five) minutes as needed for chest pain. (Patient not taking: Reported on 11/06/2022)   rosuvastatin (CRESTOR) 5 MG tablet Take 1 tablet (5 mg total) by mouth every other day. (Patient not taking: Reported on 11/06/2022)   tiZANidine (ZANAFLEX) 4 MG tablet Take 8 mg by mouth 2 (two) times daily. (Patient not taking: Reported on 11/06/2022)   triamcinolone cream (KENALOG) 0.1 % Apply topically. (Patient not taking: Reported on 11/06/2022)   No facility-administered encounter medications on file as of 11/06/2022.    No Known Allergies  Care Team Updated in EHR: Yes  Last Vision Exam: 10/2022 Wears corrective lenses: Yes Last Dental Exam: 2021 Last Hearing Exam: Unsure Wears Hearing Aids: No  Functional Ability / Safety Screening 1.  Was the timed Get Up and Go test longer than 30 seconds?  yes 2.  Does the patient need help with the phone, transportation, shopping,      preparing meals, housework, laundry, medications, or managing money?  no 3.  Does the patient's home have:  loose throw rugs in the hallway?   no      Grab bars in the bathroom? no      Handrails on the stairs?   yes      Poor lighting?   no 4.  Has the patient noticed any hearing difficulties?   no  Diet Recall and Exercise Regimen: he is trying to cut down on fried food . He is very active   Fall Risk:  See screening under Objective Information  Depression Screen:    Flowsheet Row Office Visit [facility]/06/20[facility]  PHQ-9 Total Score 0        Advanced Directives: A voluntary discussion about advance care  planning including the explanation and discussion of advance directives was discussed with the patient. Explanation about the health care proxy and living will was reviewed.  During this discussion, the patient was able to identify a health care proxy as mother and plans/does not plan to  fill out the paperwork required and will bring this to our office to keep on file. Does patient have a HCPOA?    no If yes, name and contact information:  Does patient have a living will or MOST form?  no  Cancer Screenings: Skin: discussed atypical lesions  Lung: Low Dose CT Chest recommended if Age 39-80 years, 30 pack-year currently smoking OR have quit w/in 15years. Patient does not qualify.  Lifestyle risk factor issued reviewed: Diet, exercise, weight management, advised patient smoking is not healthy, nutrition/diet.   Prostate: under the care of Dr. Lonna Cobb  Colon: Last Colonoscopy 04/21/17, due  Additional Screenings: Hepatitis B/HIV/Syphillis: N/A Hepatitis C Screening: 08/25/22 Intimate Partner Violence: Negative AAA Screen: Men age 45 to 75 years if ever smoked recommended to get a one time AAA ultrasound screening exam. Patient does not qualify.  Objective:   Vitals: BP 138/86   Pulse 90   Resp 16   Ht 6\' 3"  (1.905 m)   Wt 251 lb (113.9 kg)   SpO2 99%   BMI 31.37 kg/m  Body mass index is 31.37 kg/m.  Lab Results  Component Value Date   CHOL 88 08/25/2022   CHOL 136 04/14/2022   CHOL 66 (L) 12/20/2019   Lab Results  Component Value Date   HDL 45 08/25/2022   HDL 37 (L) 04/14/2022   HDL 28 (L) 12/20/2019   Lab Results  Component Value Date   LDLCALC 24 08/25/2022   LDLCALC 79 04/14/2022   LDLCALC 19 12/20/2019   Lab Results  Component Value Date   TRIG 108 08/25/2022   TRIG 109 04/14/2022   TRIG 95 12/20/2019   Lab Results  Component Value Date   CHOLHDL 2.0 08/25/2022   CHOLHDL 3.7 04/14/2022   CHOLHDL 2.0 08/10/2018   No results found for: "LDLDIRECT"  Hearing Screening   500Hz  1000Hz  2000Hz  4000Hz   Right ear Pass Pass Pass Pass  Left ear Pass Pass Pass Pass   Vision Screening   Right eye Left eye Both eyes  Without correction     With correction 20/20 20/30 13    Physical Exam Constitutional: Patient appears well-developed and  well-nourished. Obese  No distress.  HEENT: head atraumatic, normocephalic, pupils equal and reactive to light, neck supple Cardiovascular: Normal rate, regular rhythm and normal heart sounds.  No murmur heard. No BLE edema. Pulmonary/Chest: Effort normal and breath sounds normal. No respiratory distress. Abdominal: Soft.  There is no tenderness. Psychiatric: Patient has a normal mood and affect. behavior is normal. Judgment and thought content normal.   Cognitive Testing - 6-CIT  Correct? Score   What year is it? yes 0 Yes = 0    No = 4  What month is it? yes 0 Yes = 0    No = 3  Remember:     [place]     What time is it? yes 0 Yes = 0    No = 3  Count backwards from 20 to 1 yes 0 Correct = 0    1 error = 2   More than 1 error = 4  Say the months of the year in reverse. yes 0 Correct =  0    1 error = 2   More than 1 error = 4  What address did I ask you to remember? yes 0 Correct = 0  1 error = 2    2 error = 4    3 error = 6    4 error = 8    All wrong = 10       TOTAL SCORE  0/28   Interpretation:  Normal  Normal (0-7) Abnormal (8-28)   Fall Risk:    11/06/2022   10:37 AM 10/15/2022   11:33 AM 08/25/2022    1:34 PM 07/27/2018   10:26 AM 06/02/2018    9:08 AM  Fall Risk   Falls in the past year? 0 0 0 0 0  Number falls in past yr: 0 0  0 0  Injury with Fall? 0 0  0 0  Risk for fall due to : No Fall Risks  No Fall Risks    Follow up Falls prevention discussed  Falls prevention discussed;Education provided;Falls evaluation completed      Depression Screen    11/06/2022   10:37 AM 10/15/2022   11:33 AM 08/25/2022    1:42 PM 08/25/2022    1:40 PM 07/27/2018   10:26 AM  Depression screen PHQ 2/9  Decreased Interest 0 0 0 0 0  Down, Depressed, Hopeless 0 0 0 0 0  PHQ - 2 Score 0 0 0 0 0  Altered sleeping 0 0 0  0  Tired, decreased energy 0 0 0  0  Change in appetite 0 0 0  0  Feeling bad or failure about yourself  0 0 0  0  Trouble concentrating 0 0 0   0  Moving slowly or fidgety/restless 0 0 0  0  Suicidal thoughts 0 0 0  0  PHQ-9 Score 0 0 0  0  Difficult doing work/chores  Not difficult at all   Not difficult at all      Assessment & Plan:     1. Welcome to Medicare preventive visit   Exercise Activities and Dietary recommendations  Avoid unhealthy snacks Continue regular physical activity   Discussed health benefits of physical activity, and encouraged him to engage in regular exercise appropriate for his age and condition.   Immunization History  Administered Date(s) Administered   MODERNA COVID-19 SARS-COV-2 PEDS BIVALENT BOOSTER 75yr-60yr 05/22/2019, 06/19/2019   PNEUMOCOCCAL CONJUGATE-20 08/25/2022   Pneumococcal-Unspecified 04/24/2016   Tdap 07/13/2017    Health Maintenance  Topic Date Due   FOOT EXAM  03/06/2019   COVID-19 Vaccine (3 - 2023-24 season) 11/22/2021   Colonoscopy  04/21/2022   INFLUENZA VACCINE  10/23/2022   Zoster Vaccines- Shingrix (1 of 2) 11/25/2022 (Originally 11/16/2006)   HIV Screening  08/25/2023 (Originally 11/16/1971)   HEMOGLOBIN A1C  02/24/2023   Diabetic kidney evaluation - Urine ACR  08/25/2023   OPHTHALMOLOGY EXAM  08/25/2023   Diabetic kidney evaluation - eGFR measurement  10/15/2023   Medicare Annual Wellness (AWV)  11/06/2023   DTaP/Tdap/Td (2 - Td or Tdap) 07/14/2027   Pneumonia Vaccine 52+ Years old  Completed   Hepatitis C Screening  Completed   HPV VACCINES  Aged Out     No orders of the defined types were placed in this encounter.   Current Outpatient Medications:    aspirin 81 MG tablet, Take 81 mg by mouth daily., Disp: , Rfl:    cetirizine (ZYRTEC) 10 MG tablet, Take 10 mg by  mouth daily., Disp: , Rfl:    Cholecalciferol (VITAMIN D) 125 MCG (5000 UT) CAPS, Take 5,000 mcg by mouth daily., Disp: , Rfl:    cyanocobalamin (VITAMIN B12) 1000 MCG tablet, Take 1,000 mcg by mouth daily., Disp: , Rfl:    dorzolamide (TRUSOPT) 2 % ophthalmic solution, 1 drop 3 (three)  times daily., Disp: , Rfl:    dutasteride (AVODART) 0.5 MG capsule, Take 1 capsule (0.5 mg total) by mouth daily., Disp: 150 capsule, Rfl: 0   Evolocumab (REPATHA SURECLICK) 140 MG/ML SOAJ, Inject 140 mg into the skin every 14 (fourteen) days., Disp: 2 mL, Rfl: 11   latanoprost (XALATAN) 0.005 % ophthalmic solution, Place 1 drop into both eyes at bedtime. , Disp: , Rfl:    losartan (COZAAR) 100 MG tablet, Take 1 tablet by mouth once daily, Disp: 90 tablet, Rfl: 3   MAGNESIUM PO, Take 1 capsule by mouth daily., Disp: , Rfl:    metoprolol succinate (TOPROL-XL) 25 MG 24 hr tablet, Take 1 tablet by mouth once daily, Disp: 30 tablet, Rfl: 10   Testosterone Undecanoate (JATENZO) 158 MG CAPS, Take 2 capsules (316 mg total) by mouth in the morning and at bedtime., Disp: 360 capsule, Rfl: 0   VITAMIN E PO, Take by mouth daily., Disp: , Rfl:    nitroGLYCERIN (NITROSTAT) 0.4 MG SL tablet, Place 1 tablet (0.4 mg total) under the tongue every 5 (five) minutes as needed for chest pain. (Patient not taking: Reported on 11/06/2022), Disp: 25 tablet, Rfl: PRN   rosuvastatin (CRESTOR) 5 MG tablet, Take 1 tablet (5 mg total) by mouth every other day. (Patient not taking: Reported on 11/06/2022), Disp: 45 tablet, Rfl: 3   tiZANidine (ZANAFLEX) 4 MG tablet, Take 8 mg by mouth 2 (two) times daily. (Patient not taking: Reported on 11/06/2022), Disp: , Rfl:    triamcinolone cream (KENALOG) 0.1 %, Apply topically. (Patient not taking: Reported on 11/06/2022), Disp: , Rfl:  There are no discontinued medications.  I have personally reviewed and addressed the Medicare Annual Wellness health risk assessment questionnaire and have noted the following in the patient's chart:  A.         Medical and social history & family history B.         Use of alcohol, tobacco or illicit drugs  C.         Current medications and supplements D.         Functional and Cognitive ability and status E.         Nutritional status F.          Physical activity G.        Advance directives H.         List of other physicians I.          Hospitalizations, surgeries, and ER visits in previous 12 months J.         Vitals K.         Screenings such as hearing and vision if needed, cognitive and depression L.         Referrals and appointments -   In addition, I have reviewed and discussed with patient certain preventive protocols, quality metrics, and best practice recommendations. A written personalized care plan for preventive services as well as general preventive health recommendations were provided to patient.   See attached scanned questionnaire for additional information.

## 2022-11-06 ENCOUNTER — Encounter: Payer: Self-pay | Admitting: Family Medicine

## 2022-11-06 ENCOUNTER — Ambulatory Visit (INDEPENDENT_AMBULATORY_CARE_PROVIDER_SITE_OTHER): Payer: Medicare PPO | Admitting: Family Medicine

## 2022-11-06 ENCOUNTER — Encounter: Payer: Self-pay | Admitting: Cardiovascular Disease

## 2022-11-06 VITALS — BP 128/70 | HR 90 | Resp 16 | Ht 75.0 in | Wt 251.0 lb

## 2022-11-06 DIAGNOSIS — Z1211 Encounter for screening for malignant neoplasm of colon: Secondary | ICD-10-CM | POA: Diagnosis not present

## 2022-11-06 DIAGNOSIS — R748 Abnormal levels of other serum enzymes: Secondary | ICD-10-CM | POA: Diagnosis not present

## 2022-11-06 DIAGNOSIS — Z0000 Encounter for general adult medical examination without abnormal findings: Secondary | ICD-10-CM | POA: Diagnosis not present

## 2022-11-07 ENCOUNTER — Other Ambulatory Visit: Payer: Self-pay

## 2022-11-07 ENCOUNTER — Telehealth: Payer: Self-pay

## 2022-11-07 ENCOUNTER — Other Ambulatory Visit: Payer: Self-pay | Admitting: Family Medicine

## 2022-11-07 ENCOUNTER — Telehealth: Payer: Self-pay | Admitting: *Deleted

## 2022-11-07 DIAGNOSIS — R748 Abnormal levels of other serum enzymes: Secondary | ICD-10-CM

## 2022-11-07 DIAGNOSIS — R252 Cramp and spasm: Secondary | ICD-10-CM

## 2022-11-07 DIAGNOSIS — Z86010 Personal history of colonic polyps: Secondary | ICD-10-CM

## 2022-11-07 MED ORDER — NA SULFATE-K SULFATE-MG SULF 17.5-3.13-1.6 GM/177ML PO SOLN: 1.0000 | Freq: Once | ORAL | 0 refills | Status: AC

## 2022-11-07 NOTE — Telephone Encounter (Signed)
   Pre-operative Risk Assessment    Patient Name: Dean Sullivan  DOB: 04-Jan-1956 MRN: [id number]      Request for Surgical Clearance    Procedure:   COLONOSCOPY  Date of Surgery:  Clearance 12/26/22                                 Surgeon:  DR. Sharlet Salina ANNA Surgeon's Group or Practice Name:  [facility] Phone number:  [phone] Fax number:  [phone]   Type of Clearance Requested:   - Medical ; NO MEDICATIONS LISTED AS NEEDING TO BE HELD   Type of Anesthesia:  General    Additional requests/questions:    Dean Sullivan   11/07/2022, 1:19 PM    FW: Clinical Received: Today Dean Sullivan, Dean Ripple, NP  Dean Sullivan, CMA  Please add to preoperative pool.  Thank you for your help.       Previous Messages  Letter Reason for letter: Clinical Dean Sullivan, CMA on 11/07/2022     [facility] at [facility] [address] [place], [place]  [address] Phone:  [phone]   Fax:  [phone]   11/07/2022   Patient Name: Dean Sullivan   DOB: 1955-07-05   To CV Preop Team:       [facility] Pre-operative Risk Assessment    Request for surgical clearance:   What type of surgery is being performed? Colonoscopy    When is this surgery scheduled? 12/26/22    Are there any medications that need to be held prior to surgery and how long?NO    Practice name and name of physician performing surgery? Dr. Wyline Mood    What is your office phone and fax number? Office [phone].  Fax [phone]    Anesthesia type (None, local, MAC, general) ? General         _________________________________________________________________   (provider comments below)    [facility] Gastroenterology at [facility], [phone]                       1

## 2022-11-07 NOTE — Telephone Encounter (Signed)
-----   Message from Ronney Asters sent at 11/07/2022 12:20 PM EDT ----- Regarding: FW: Clinical  Please add to preoperative pool.  Thank you for your help. ----- Message ----- From: [name], CMA Sent: 11/07/2022  11:13 AM EDT To: Cv Div Preop Subject: Clinical

## 2022-11-07 NOTE — Telephone Encounter (Signed)
     Primary Cardiologist: Julien Nordmann, MD  Chart reviewed as part of pre-operative protocol coverage. Given past medical history and time since last visit, based on ACC/AHA guidelines, Dean Sullivan would be at acceptable risk for the planned procedure without further cardiovascular testing.     I will route this recommendation to the requesting party via Epic fax function and remove from pre-op pool.  Please call with questions.  Thomasene Ripple. Rhyleigh Grassel NP-C     11/07/2022, 1:55 PM [facility] [address] Office [phone] Fax [phone]

## 2022-11-07 NOTE — Telephone Encounter (Signed)
Gastroenterology Pre-Procedure Review  Request Date: 12/26/22 Requesting Physician: Dr. Tobi Bastos  PATIENT REVIEW QUESTIONS: The patient responded to the following health history questions as indicated:    1. Are you having any GI issues? no 2. Do you have a personal history of Polyps? yes (last colonoscopy 04/21/17 with Dr. Tobi Bastos recommend repeat in 5 years) 3. Do you have a family history of Colon Cancer or Polyps? no 4. Diabetes Mellitus? no 5. Joint replacements in the past 12 months?no 6. Major health problems in the past 3 months?no 7. Any artificial heart valves, MVP, or defibrillator?no 8. Cardiac Issues? Yes-CAD, Angina-cardiac clearance sent to CV Preop Team patient of Dr. Mariah Milling     MEDICATIONS & ALLERGIES:    Patient reports the following regarding taking any anticoagulation/antiplatelet therapy:   Plavix, Coumadin, Eliquis, Xarelto, Lovenox, Pradaxa, Brilinta, or Effient? no Aspirin? 81 mg daily  Patient confirms/reports the following medications:  Current Outpatient Medications  Medication Sig Dispense Refill   Na Sulfate-K Sulfate-Mg Sulf 17.5-3.13-1.6 GM/177ML SOLN Take 1 kit by mouth once for 1 dose. 354 mL 0   aspirin 81 MG tablet Take 81 mg by mouth daily.     cetirizine (ZYRTEC) 10 MG tablet Take 10 mg by mouth daily.     Cholecalciferol (VITAMIN D) 125 MCG (5000 UT) CAPS Take 5,000 mcg by mouth daily.     cyanocobalamin (VITAMIN B12) 1000 MCG tablet Take 1,000 mcg by mouth daily.     dorzolamide (TRUSOPT) 2 % ophthalmic solution 1 drop 3 (three) times daily.     dutasteride (AVODART) 0.5 MG capsule Take 1 capsule (0.5 mg total) by mouth daily. 150 capsule 0   Evolocumab (REPATHA SURECLICK) 140 MG/ML SOAJ Inject 140 mg into the skin every 14 (fourteen) days. 2 mL 11   latanoprost (XALATAN) 0.005 % ophthalmic solution Place 1 drop into both eyes at bedtime.      losartan (COZAAR) 100 MG tablet Take 1 tablet by mouth once daily 90 tablet 3   MAGNESIUM PO Take 1 capsule  by mouth daily.     metoprolol succinate (TOPROL-XL) 25 MG 24 hr tablet Take 1 tablet by mouth once daily 30 tablet 10   nitroGLYCERIN (NITROSTAT) 0.4 MG SL tablet Place 1 tablet (0.4 mg total) under the tongue every 5 (five) minutes as needed for chest pain. (Patient not taking: Reported on 11/06/2022) 25 tablet PRN   rosuvastatin (CRESTOR) 5 MG tablet Take 1 tablet (5 mg total) by mouth every other day. (Patient not taking: Reported on 11/06/2022) 45 tablet 3   Testosterone Undecanoate (JATENZO) 158 MG CAPS Take 2 capsules (316 mg total) by mouth in the morning and at bedtime. 360 capsule 0   tiZANidine (ZANAFLEX) 4 MG tablet Take 8 mg by mouth 2 (two) times daily. (Patient not taking: Reported on 11/06/2022)     triamcinolone cream (KENALOG) 0.1 % Apply topically. (Patient not taking: Reported on 11/06/2022)     VITAMIN E PO Take by mouth daily.     No current facility-administered medications for this visit.    Patient confirms/reports the following allergies:  No Known Allergies  No orders of the defined types were placed in this encounter.   AUTHORIZATION INFORMATION Primary Insurance: 1D#: Group #:  Secondary Insurance: 1D#: Group #:  SCHEDULE INFORMATION: Date: 12/26/22 Time: Location: [facility]

## 2022-11-12 MED ORDER — SILDENAFIL CITRATE 20 MG PO TABS: 20.0000 mg | ORAL_TABLET | ORAL | 0 refills | Status: DC | PRN

## 2022-11-19 DIAGNOSIS — M48061 Spinal stenosis, lumbar region without neurogenic claudication: Secondary | ICD-10-CM | POA: Diagnosis not present

## 2022-11-20 ENCOUNTER — Telehealth: Payer: Self-pay

## 2022-11-20 NOTE — Telephone Encounter (Signed)
Cardiac clearance has been granted for patients colonoscopy per received fax from Dr. Mariah Milling at [facility], "Chart reviewed as part of pre-operative protocol coverage. Given past medical history and time since last visit, based on ACC/AHA guidelines, Ramont Kincheloe would be at acceptable risk for the planned procedure without further cardiovascular testing".   Thanks, [place], [place]

## 2022-11-21 ENCOUNTER — Ambulatory Visit: Payer: Medicare PPO | Attending: Nurse Practitioner | Admitting: Nurse Practitioner

## 2022-11-21 ENCOUNTER — Encounter: Payer: Self-pay | Admitting: Nurse Practitioner

## 2022-11-21 VITALS — BP 146/80 | HR 69 | Ht 75.0 in | Wt 251.2 lb

## 2022-11-21 DIAGNOSIS — E785 Hyperlipidemia, unspecified: Secondary | ICD-10-CM

## 2022-11-21 DIAGNOSIS — I25110 Atherosclerotic heart disease of native coronary artery with unstable angina pectoris: Secondary | ICD-10-CM | POA: Diagnosis not present

## 2022-11-21 DIAGNOSIS — G4733 Obstructive sleep apnea (adult) (pediatric): Secondary | ICD-10-CM | POA: Diagnosis not present

## 2022-11-21 DIAGNOSIS — I10 Essential (primary) hypertension: Secondary | ICD-10-CM

## 2022-11-21 NOTE — Progress Notes (Signed)
Office Visit    Patient Name: Dean Sullivan Date of Encounter: 11/21/2022  Primary Care Provider:  Alba Cory, MD Primary Cardiologist:  Julien Nordmann, MD  Chief Complaint    67 y.o. male with a history of CAD status post prior LAD stenting, hypertension, hyperlipidemia, diabetes, obesity, and sleep apnea on CPAP, presents for follow-up after recent diagnostic catheterization.  Past Medical History    Past Medical History:  Diagnosis Date   Allergy    CAD (coronary artery disease)    a. 04/2012 PCI: LAD 70-75 (3.5x22 Resolute DES), RCA 60-95p/m (2.5x12 Resolute DES). EF 60%; b. 09/2022 Cath: LM nl, LAD patent p/m stent, 20d, D1 nl, LCX large, 55p/m, RCA 25p/m ISR, RPDA 80. EF 50-55%-->Med rx.   Coronary arteriosclerosis in native artery    GERD (gastroesophageal reflux disease)    occ   Glaucoma    Hearing loss of right ear    patient was told he had slight hearing loss   History of echocardiogram    a. 09/2018 Echo: EF 55-60%, no rwma, nl RV fxn, mild BAE; b. 09/2022 Echo: EF 60-65%, no rwma, nl RV fxn, mild MR.   History of kidney stones    History of trigger finger    bilateral, but mostly in right thumb   Hyperlipidemia    Hypertension    Irregular heart beat    Sleep apnea    cpap   Type 2 diabetes mellitus (HCC) 03/18/2017   A1c 6.28 Jan 2017   Past Surgical History:  Procedure Laterality Date   CARDIAC CATHETERIZATION     x2 stents [facility]   CAROTID STENT INSERTION  2012   2   COLONOSCOPY WITH PROPOFOL N/A 04/21/2017   Procedure: COLONOSCOPY WITH PROPOFOL;  Surgeon: Wyline Mood, MD;  Location[facility] ENDOSCOPY;  Service: Gastroenterology;  Laterality: N/A;   LEFT HEART CATH AND CORONARY ANGIOGRAPHY Left 09/23/2022   Procedure: LEFT HEART CATH AND CORONARY ANGIOGRAPHY;  Surgeon: Yvonne Kendall, MD;  Location: [facility] INVASIVE CV LAB;  Service: Cardiovascular;  Laterality: Left;   LIPOMA EXCISION Left 03/09/2017   Procedure: EXCISION LIPOMA;   Surgeon: Deeann Saint, MD;  Location: [facility] ORS;  Service: Orthopedics;  Laterality: Left;   PROSTATE BIOPSY  03/03/2018   RESECTION DISTAL CLAVICAL Left 03/09/2017   Procedure: RESECTION DISTAL CLAVICAL;  Surgeon: Deeann Saint, MD;  Location: [facility] ORS;  Service: Orthopedics;  Laterality: Left;   SHOULDER ARTHROSCOPY WITH OPEN ROTATOR CUFF REPAIR Left 03/09/2017   Procedure: SHOULDER ARTHROSCOPY WITH OPEN ROTATOR CUFF REPAIR;  Surgeon: Deeann Saint, MD;  Location: [facility] ORS;  Service: Orthopedics;  Laterality: Left;   SUBACROMIAL DECOMPRESSION Left 03/09/2017   Procedure: SUBACROMIAL DECOMPRESSION;  Surgeon: Deeann Saint, MD;  Location: [facility] ORS;  Service: Orthopedics;  Laterality: Left;  BICEPS TENOTOMY    Allergies  No Known Allergies  History of Present Illness      67 y.o. y/o male with a past medical history including CAD, hypertension, hyperlipidemia, diabetes, obesity, and sleep apnea on CPAP.  He previously underwent PCI and stenting of the LAD and right coronary artery in February 2014.  In the setting of c/p, he underwent stress testing in 2020 which did not show any significant ischemia or scar but showed an EF of 30 to 44% with significant extracardiac activity.  Event monitoring in June 2020 showed predominantly sinus rhythm with 2 brief episodes of SVT, occasional PACs and PVCs.  Echocardiogram in July 2020 showed normal LV function, and thus EF on  stress testing was felt to be falsely low, and he was medically managed.  He was seen in June 2024 due to intermittent, sharp left-sided chest pain prompting urgent care evaluation, with normal workup.  At cardiology follow-up, he reported unstable anginal symptoms and decision was made to pursue diagnostic catheterization, which was performed in early July 2024, showing patent stents in the LAD and RCA (minimal in-stent restenosis), and an 80% stenosis in the RPDA.  The RPDA was too small/distal for PCI medical therapy was  recommended.  Follow-up echo showed EF 60 to 65% with no regional wall motion abnormalities, and mild MR.   Since his last visit, he has felt relatively well from a cardiac standpoint.  He has not been experiencing chest pain, dyspnea, or palpitations.  His right wrist is healed up well.  He has had some sciatica with right leg and low back pain, which was slightly aggravated following his trip to [place] for Latin dancing.  He was recently evaluated by primary care with finding of elevated CK of 527 in July and statin d/c'd.  F/u CK 618 on 8/15 and he was referred to rheum.  His blood pressure is elevated today at 146/80 however, he has yet to take his morning medications.  He denies PND, orthopnea, dizziness, syncope, edema, or early satiety.  Home Medications    Current Outpatient Medications  Medication Sig Dispense Refill   aspirin 81 MG tablet Take 81 mg by mouth daily.     cetirizine (ZYRTEC) 10 MG tablet Take 10 mg by mouth daily.     Cholecalciferol (VITAMIN D) 125 MCG (5000 UT) CAPS Take 5,000 mcg by mouth daily.     cyanocobalamin (VITAMIN B12) 1000 MCG tablet Take 1,000 mcg by mouth daily.     dorzolamide (TRUSOPT) 2 % ophthalmic solution 1 drop 3 (three) times daily.     dutasteride (AVODART) 0.5 MG capsule Take 1 capsule (0.5 mg total) by mouth daily. 150 capsule 0   Evolocumab (REPATHA SURECLICK) 140 MG/ML SOAJ Inject 140 mg into the skin every 14 (fourteen) days. 2 mL 11   latanoprost (XALATAN) 0.005 % ophthalmic solution Place 1 drop into both eyes at bedtime.      losartan (COZAAR) 100 MG tablet Take 1 tablet by mouth once daily 90 tablet 3   MAGNESIUM PO Take 1 capsule by mouth daily.     metoprolol succinate (TOPROL-XL) 25 MG 24 hr tablet Take 1 tablet by mouth once daily 30 tablet 10   nitroGLYCERIN (NITROSTAT) 0.4 MG SL tablet Place 1 tablet (0.4 mg total) under the tongue every 5 (five) minutes as needed for chest pain. 25 tablet PRN   sildenafil (REVATIO) 20 MG tablet Take 1  tablet (20 mg total) by mouth as needed. 90 tablet 0   Testosterone Undecanoate (JATENZO) 158 MG CAPS Take 2 capsules (316 mg total) by mouth in the morning and at bedtime. 360 capsule 0   VITAMIN E PO Take by mouth daily.     rosuvastatin (CRESTOR) 5 MG tablet Take 1 tablet (5 mg total) by mouth every other day. (Patient not taking: Reported on 11/06/2022) 45 tablet 3   No current facility-administered medications for this visit.     Review of Systems    He denies chest pain, palpitations, dyspnea, pnd, orthopnea, n, v, dizziness, syncope, edema, weight gain, or early satiety.  Has had low back and right leg pain in the setting of sciatica.  All other systems reviewed and are otherwise negative  except as noted above.    Physical Exam    VS:  BP (!) 150/90 (BP Location: Left Arm, Patient Position: Sitting, Cuff Size: Normal)   Pulse 69   Ht 6\' 3"  (1.905 m)   Wt 251 lb 3.2 oz (113.9 kg)   SpO2 96%   BMI 31.40 kg/m  , BMI Body mass index is 31.4 kg/m.     Vitals:   11/21/22 0856 11/21/22 1824  BP: (!) 150/90 (!) 146/80  Pulse: 69   SpO2: 96%     GEN: Well nourished, well developed, in no acute distress. HEENT: normal. Neck: Supple, no JVD, carotid bruits, or masses. Cardiac: RRR, no murmurs, rubs, or gallops. No clubbing, cyanosis, edema.  Radials 2+/PT 2+ and equal bilaterally.  Right radial catheterization site without bleeding, bruit, or hematoma. Respiratory:  Respirations regular and unlabored, clear to auscultation bilaterally. GI: Soft, nontender, nondistended, BS + x 4. MS: no deformity or atrophy. Skin: warm and dry, no rash. Neuro:  Strength and sensation are intact. Psych: Normal affect.  Accessory Clinical Findings    Lab Results  Component Value Date   WBC 5.3 10/15/2022   HGB 15.9 10/15/2022   HCT 48.1 10/15/2022   MCV 87.0 10/15/2022   PLT 240 10/15/2022   Lab Results  Component Value Date   CREATININE 1.06 10/15/2022   BUN 14 10/15/2022   NA 140  10/15/2022   K 3.9 10/15/2022   CL 105 10/15/2022   CO2 25 10/15/2022   Lab Results  Component Value Date   ALT 20 10/15/2022   AST 19 10/15/2022   ALKPHOS 51 09/17/2022   BILITOT 1.1 10/15/2022   Lab Results  Component Value Date   CHOL 88 08/25/2022   HDL 45 08/25/2022   LDLCALC 24 08/25/2022   TRIG 108 08/25/2022   CHOLHDL 2.0 08/25/2022    Lab Results  Component Value Date   HGBA1C 5.7 (A) 08/25/2022    Assessment & Plan    1.  Precordial chest pain/coronary artery disease: Status post prior LAD and RCA stenting in 2014 with more recent diagnostic catheterization in the setting of chest pain with typical and atypical features.  Catheterization showed patent LAD and RCA stents with minimal in-stent restenosis and 85% distal stenosis in the PDA, which was felt to be too distal and too small to intervene upon.  Fortunately, he has been doing well without any chest pain or dyspnea and just returned from Latin dancing in [place], where he did not experience any significant symptoms other than back pain and right leg pain in the setting of sciatica.  He remains on aspirin, Repatha, and beta-blocker therapy.  I note that he is pending EGD and he is already been cleared by our team to proceed without additional evaluation.  2.  Hypertension: Blood pressure elevated today at 146/80.  He is not taking his metoprolol or losartan yet this morning.  I encouraged him to go home and take his blood pressure medicines and continue to follow his blood pressure at home and let us know in the next 2 weeks if he is trending high, at which time we will need to consider an additional agent.  3.  Hyperlipidemia: Statin intolerant but on Repatha with an LDL of 24 in June.  4.  Obstructive sleep apnea: On CPAP.  5.  Disposition: Follow-up in clinic in 4 months or sooner if necessary.  Nicolasa Ducking, NP 11/21/2022, 6:21 PM

## 2022-11-21 NOTE — Patient Instructions (Signed)
Medication Instructions:   Your physician recommends that you continue on your current medications as directed. Please refer to the Current Medication list given to you today.  *If you need a refill on your cardiac medications before your next appointment, please call your pharmacy*   Lab Work:  None Ordered  If you have labs (blood work) drawn today and your tests are completely normal, you will receive your results only by: MyChart Message (if you have MyChart) OR A paper copy in the mail If you have any lab test that is abnormal or we need to change your treatment, we will call you to review the results.   Testing/Procedures:  None Ordered   Follow-Up: At [facility], you and your health needs are our priority.  As part of our continuing mission to provide you with exceptional heart care, we have created designated Provider Care Teams.  These Care Teams include your primary Cardiologist (physician) and Advanced Practice Providers (APPs -  Physician Assistants and Nurse Practitioners) who all work together to provide you with the care you need, when you need it.  We recommend signing up for the patient portal called "MyChart".  Sign up information is provided on this After Visit Summary.  MyChart is used to connect with patients for Virtual Visits (Telemedicine).  Patients are able to view lab/test results, encounter notes, upcoming appointments, etc.  Non-urgent messages can be sent to your provider as well.   To learn more about what you can do with MyChart, go to [email or web address].    Your next appointment:   4 month(s)  Provider:   Julien Nordmann, MD or Nicolasa Ducking, NP

## 2022-12-05 DIAGNOSIS — M48061 Spinal stenosis, lumbar region without neurogenic claudication: Secondary | ICD-10-CM | POA: Diagnosis not present

## 2022-12-10 DIAGNOSIS — M48062 Spinal stenosis, lumbar region with neurogenic claudication: Secondary | ICD-10-CM | POA: Diagnosis not present

## 2022-12-25 ENCOUNTER — Telehealth: Payer: Self-pay | Admitting: Gastroenterology

## 2022-12-25 ENCOUNTER — Other Ambulatory Visit: Payer: Self-pay

## 2022-12-25 MED ORDER — NA SULFATE-K SULFATE-MG SULF 17.5-3.13-1.6 GM/177ML PO SOLN: 1.0000 | Freq: Once | ORAL | 0 refills | Status: AC

## 2022-12-25 NOTE — Telephone Encounter (Signed)
Patient stated that he needs another prep sent [facility] on [address] for his procedure tomorrow.

## 2022-12-26 ENCOUNTER — Ambulatory Visit: Payer: Medicare PPO | Admitting: Anesthesiology

## 2022-12-26 ENCOUNTER — Other Ambulatory Visit: Payer: Self-pay

## 2022-12-26 ENCOUNTER — Encounter
Admission: RE | Disposition: A | Discharge status: Home / Self Care | Payer: Self-pay | Source: Ambulatory Visit | Attending: Gastroenterology

## 2022-12-26 ENCOUNTER — Encounter: Payer: Self-pay | Admitting: Gastroenterology

## 2022-12-26 ENCOUNTER — Ambulatory Visit
Admission: RE | Admit: 2022-12-26 | Discharge: 2022-12-26 | Disposition: A | Discharge status: Home / Self Care | Payer: Medicare PPO | Source: Ambulatory Visit | Attending: Gastroenterology | Admitting: Gastroenterology

## 2022-12-26 DIAGNOSIS — K219 Gastro-esophageal reflux disease without esophagitis: Secondary | ICD-10-CM | POA: Insufficient documentation

## 2022-12-26 DIAGNOSIS — Z6830 Body mass index (BMI) 30.0-30.9, adult: Secondary | ICD-10-CM | POA: Diagnosis not present

## 2022-12-26 DIAGNOSIS — Z7984 Long term (current) use of oral hypoglycemic drugs: Secondary | ICD-10-CM | POA: Diagnosis not present

## 2022-12-26 DIAGNOSIS — I25110 Atherosclerotic heart disease of native coronary artery with unstable angina pectoris: Secondary | ICD-10-CM | POA: Diagnosis not present

## 2022-12-26 DIAGNOSIS — G4730 Sleep apnea, unspecified: Secondary | ICD-10-CM | POA: Diagnosis not present

## 2022-12-26 DIAGNOSIS — Z860101 Personal history of adenomatous and serrated colon polyps: Secondary | ICD-10-CM | POA: Insufficient documentation

## 2022-12-26 DIAGNOSIS — Z1211 Encounter for screening for malignant neoplasm of colon: Secondary | ICD-10-CM | POA: Diagnosis not present

## 2022-12-26 DIAGNOSIS — I2510 Atherosclerotic heart disease of native coronary artery without angina pectoris: Secondary | ICD-10-CM | POA: Insufficient documentation

## 2022-12-26 DIAGNOSIS — I10 Essential (primary) hypertension: Secondary | ICD-10-CM | POA: Diagnosis not present

## 2022-12-26 DIAGNOSIS — Z860100 Personal history of colon polyps, unspecified: Secondary | ICD-10-CM

## 2022-12-26 DIAGNOSIS — E119 Type 2 diabetes mellitus without complications: Secondary | ICD-10-CM | POA: Diagnosis not present

## 2022-12-26 DIAGNOSIS — Z79890 Hormone replacement therapy (postmenopausal): Secondary | ICD-10-CM | POA: Diagnosis not present

## 2022-12-26 DIAGNOSIS — K5730 Diverticulosis of large intestine without perforation or abscess without bleeding: Secondary | ICD-10-CM | POA: Insufficient documentation

## 2022-12-26 HISTORY — PX: COLONOSCOPY WITH PROPOFOL: SHX5780

## 2022-12-26 LAB — GLUCOSE, CAPILLARY: Glucose-Capillary: 81 mg/dL (ref 70–99)

## 2022-12-26 SURGERY — COLONOSCOPY WITH PROPOFOL
Anesthesia: General

## 2022-12-26 MED ORDER — SODIUM CHLORIDE 0.9 % IV SOLN: INTRAVENOUS | Status: DC

## 2022-12-26 MED ORDER — PROPOFOL 10 MG/ML IV BOLUS
INTRAVENOUS | Status: AC
  Filled 2022-12-26: qty 20

## 2022-12-26 MED ORDER — PROPOFOL 1000 MG/100ML IV EMUL
INTRAVENOUS | Status: AC
  Filled 2022-12-26: qty 100

## 2022-12-26 MED ORDER — PROPOFOL 10 MG/ML IV BOLUS
INTRAVENOUS | Status: DC | PRN
  Administered 2022-12-26: 10 mg via INTRAVENOUS

## 2022-12-26 MED ORDER — LIDOCAINE HCL (CARDIAC) PF 100 MG/5ML IV SOSY
PREFILLED_SYRINGE | INTRAVENOUS | Status: DC | PRN
  Administered 2022-12-26: 50 mg via INTRAVENOUS

## 2022-12-26 MED ORDER — PROPOFOL 500 MG/50ML IV EMUL
INTRAVENOUS | Status: DC | PRN
  Administered 2022-12-26: 150 ug/kg/min via INTRAVENOUS

## 2022-12-26 NOTE — Anesthesia Preprocedure Evaluation (Signed)
Anesthesia Evaluation  Patient identified by MRN, date of birth, ID band Patient awake    Reviewed: Allergy & Precautions, NPO status , Patient's Chart, lab work & pertinent test results  Airway Mallampati: II  TM Distance: >3 FB Neck ROM: Full    Dental  (+) Teeth Intact   Pulmonary neg pulmonary ROS, sleep apnea and Continuous Positive Airway Pressure Ventilation    Pulmonary exam normal breath sounds clear to auscultation       Cardiovascular Exercise Tolerance: Good hypertension, Pt. on medications + CAD and + Cardiac Stents  negative cardio ROS Normal cardiovascular exam Rhythm:Regular Rate:Normal     Neuro/Psych negative neurological ROS  negative psych ROS   GI/Hepatic negative GI ROS, Neg liver ROS,GERD  ,,  Endo/Other  negative endocrine ROSdiabetes, Type 2, Oral Hypoglycemic Agents  Morbid obesity  Renal/GU negative Renal ROS  negative genitourinary   Musculoskeletal   Abdominal  (+) + obese  Peds negative pediatric ROS (+)  Hematology negative hematology ROS (+)   Anesthesia Other Findings Past Medical History: No date: Allergy No date: CAD (coronary artery disease)     Comment:  a. 04/2012 PCI: LAD 70-75 (3.5x22 Resolute DES), RCA               60-95p/m (2.5x12 Resolute DES). EF 60%; b. 09/2022 Cath:               LM nl, LAD patent p/m stent, 20d, D1 nl, LCX large,               55p/m, RCA 25p/m ISR, RPDA 80. EF 50-55%-->Med rx. No date: Coronary arteriosclerosis in native artery No date: GERD (gastroesophageal reflux disease)     Comment:  occ No date: Glaucoma No date: Hearing loss of right ear     Comment:  patient was told he had slight hearing loss No date: History of echocardiogram     Comment:  a. 09/2018 Echo: EF 55-60%, no rwma, nl RV fxn, mild BAE;              b. 09/2022 Echo: EF 60-65%, no rwma, nl RV fxn, mild MR. No date: History of kidney stones No date: History of trigger finger      Comment:  bilateral, but mostly in right thumb No date: Hyperlipidemia No date: Hypertension No date: Irregular heart beat No date: Sleep apnea     Comment:  cpap 03/18/2017: Type 2 diabetes mellitus (HCC)     Comment:  A1c 6.28 Jan 2017  Past Surgical History: No date: CARDIAC CATHETERIZATION     Comment:  x2 stents [facility] 2012: CAROTID STENT INSERTION     Comment:  2 04/21/2017: COLONOSCOPY WITH PROPOFOL; N/A     Comment:  Procedure: COLONOSCOPY WITH PROPOFOL;  Surgeon: Wyline Mood, MD;  Location: [facility] ENDOSCOPY;  Service:               Gastroenterology;  Laterality: N/A; 09/23/2022: LEFT HEART CATH AND CORONARY ANGIOGRAPHY; Left     Comment:  Procedure: LEFT HEART CATH AND CORONARY ANGIOGRAPHY;                Surgeon: Yvonne Kendall, MD;  Location: [facility] INVASIVE               CV LAB;  Service: Cardiovascular;  Laterality: Left; 03/09/2017: LIPOMA EXCISION; Left     Comment:  Procedure: EXCISION LIPOMA;  Surgeon: Hyacinth Meeker,  Dimas Aguas,               MD;  Location: [facility] ORS;  Service: Orthopedics;                Laterality: Left; 03/03/2018: PROSTATE BIOPSY 03/09/2017: RESECTION DISTAL CLAVICAL; Left     Comment:  Procedure: RESECTION DISTAL CLAVICAL;  Surgeon: Deeann Saint, MD;  Location: [facility] ORS;  Service: Orthopedics;                Laterality: Left; 03/09/2017: SHOULDER ARTHROSCOPY WITH OPEN ROTATOR CUFF REPAIR; Left     Comment:  Procedure: SHOULDER ARTHROSCOPY WITH OPEN ROTATOR CUFF               REPAIR;  Surgeon: Deeann Saint, MD;  Location: [facility]               ORS;  Service: Orthopedics;  Laterality: Left; 03/09/2017: SUBACROMIAL DECOMPRESSION; Left     Comment:  Procedure: SUBACROMIAL DECOMPRESSION;  Surgeon: Deeann Saint, MD;  Location: [facility] ORS;  Service: Orthopedics;                Laterality: Left;  BICEPS TENOTOMY  BMI    Body Mass Index: 30.34 kg/m      Reproductive/Obstetrics negative OB ROS                              Anesthesia Physical Anesthesia Plan  ASA: 3  Anesthesia Plan: General   Post-op Pain Management:    Induction: Intravenous  PONV Risk Score and Plan: Propofol infusion and TIVA  Airway Management Planned: Natural Airway and Nasal Cannula  Additional Equipment:   Intra-op Plan:   Post-operative Plan:   Informed Consent: I have reviewed the patients History and Physical, chart, labs and discussed the procedure including the risks, benefits and alternatives for the proposed anesthesia with the patient or authorized representative who has indicated his/her understanding and acceptance.     Dental Advisory Given  Plan Discussed with: CRNA and Surgeon  Anesthesia Plan Comments:        Anesthesia Quick Evaluation

## 2022-12-26 NOTE — H&P (Signed)
Wyline Mood, MD [address], [place], [place], [address] [address], [place], [place], [address] Phone: [phone]  Fax: [phone]  Primary Care Physician:  Alba Cory, MD   Pre-Procedure History & Physical: HPI:  Dean Sullivan is a 68 y.o. male is here for an colonoscopy.   Past Medical History:  Diagnosis Date   Allergy    CAD (coronary artery disease)    a. 04/2012 PCI: LAD 70-75 (3.5x22 Resolute DES), RCA 60-95p/m (2.5x12 Resolute DES). EF 60%; b. 09/2022 Cath: LM nl, LAD patent p/m stent, 20d, D1 nl, LCX large, 55p/m, RCA 25p/m ISR, RPDA 80. EF 50-55%-->Med rx.   Coronary arteriosclerosis in native artery    GERD (gastroesophageal reflux disease)    occ   Glaucoma    Hearing loss of right ear    patient was told he had slight hearing loss   History of echocardiogram    a. 09/2018 Echo: EF 55-60%, no rwma, nl RV fxn, mild BAE; b. 09/2022 Echo: EF 60-65%, no rwma, nl RV fxn, mild MR.   History of kidney stones    History of trigger finger    bilateral, but mostly in right thumb   Hyperlipidemia    Hypertension    Irregular heart beat    Sleep apnea    cpap   Type 2 diabetes mellitus (HCC) 03/18/2017   A1c 6.28 Jan 2017    Past Surgical History:  Procedure Laterality Date   CARDIAC CATHETERIZATION     x2 stents [facility]   CAROTID STENT INSERTION  2012   2   COLONOSCOPY WITH PROPOFOL N/A 04/21/2017   Procedure: COLONOSCOPY WITH PROPOFOL;  Surgeon: Wyline Mood, MD;  Location: [facility] ENDOSCOPY;  Service: Gastroenterology;  Laterality: N/A;   LEFT HEART CATH AND CORONARY ANGIOGRAPHY Left 09/23/2022   Procedure: LEFT HEART CATH AND CORONARY ANGIOGRAPHY;  Surgeon: Yvonne Kendall, MD;  Location: [facility] INVASIVE CV LAB;  Service: Cardiovascular;  Laterality: Left;   LIPOMA EXCISION Left 03/09/2017   Procedure: EXCISION LIPOMA;  Surgeon: Deeann Saint, MD;  Location: [facility] ORS;  Service: Orthopedics;  Laterality: Left;   PROSTATE BIOPSY   03/03/2018   RESECTION DISTAL CLAVICAL Left 03/09/2017   Procedure: RESECTION DISTAL CLAVICAL;  Surgeon: Deeann Saint, MD;  Location: [facility] ORS;  Service: Orthopedics;  Laterality: Left;   SHOULDER ARTHROSCOPY WITH OPEN ROTATOR CUFF REPAIR Left 03/09/2017   Procedure: SHOULDER ARTHROSCOPY WITH OPEN ROTATOR CUFF REPAIR;  Surgeon: Deeann Saint, MD;  Location: [facility] ORS;  Service: Orthopedics;  Laterality: Left;   SUBACROMIAL DECOMPRESSION Left 03/09/2017   Procedure: SUBACROMIAL DECOMPRESSION;  Surgeon: Deeann Saint, MD;  Location: [facility] ORS;  Service: Orthopedics;  Laterality: Left;  BICEPS TENOTOMY    Prior to Admission medications   Medication Sig Start Date End Date Taking? Authorizing Provider  aspirin 81 MG tablet Take 81 mg by mouth daily.   Yes [provider]  Cholecalciferol (VITAMIN D) 125 MCG (5000 UT) CAPS Take 5,000 mcg by mouth daily. 09/23/22  Yes End, Cristal Deer, MD  cyanocobalamin (VITAMIN B12) 1000 MCG tablet Take 1,000 mcg by mouth daily.   Yes [provider]  dutasteride (AVODART) 0.5 MG capsule Take 1 capsule (0.5 mg total) by mouth daily. 05/02/22  Yes Antonieta Iba, MD  losartan (COZAAR) 100 MG tablet Take 1 tablet by mouth once daily 05/09/22  Yes Gollan, Tollie Pizza, MD  MAGNESIUM PO Take 1 capsule by mouth daily.   Yes [provider]  metoprolol succinate (TOPROL-XL)  25 MG 24 hr tablet Take 1 tablet by mouth once daily 04/21/22  Yes Gollan, Tollie Pizza, MD  VITAMIN E PO Take by mouth daily.   Yes [provider]  cetirizine (ZYRTEC) 10 MG tablet Take 10 mg by mouth daily.    [provider]  dorzolamide (TRUSOPT) 2 % ophthalmic solution 1 drop 3 (three) times daily. 12/13/20   [provider]  Evolocumab (REPATHA SURECLICK) 140 MG/ML SOAJ Inject 140 mg into the skin every 14 (fourteen) days. 06/16/22   Antonieta Iba, MD  latanoprost (XALATAN) 0.005 % ophthalmic solution Place 1 drop into both eyes at bedtime.   08/24/16   [provider]  nitroGLYCERIN (NITROSTAT) 0.4 MG SL tablet Place 1 tablet (0.4 mg total) under the tongue every 5 (five) minutes as needed for chest pain. 09/23/22 09/23/23  End, Cristal Deer, MD  rosuvastatin (CRESTOR) 5 MG tablet Take 1 tablet (5 mg total) by mouth every other day. Patient not taking: Reported on 11/06/2022 04/22/22   Antonieta Iba, MD  sildenafil (REVATIO) 20 MG tablet Take 1 tablet (20 mg total) by mouth as needed. 11/12/22   Antonieta Iba, MD  Testosterone Undecanoate (JATENZO) 158 MG CAPS Take 2 capsules (316 mg total) by mouth in the morning and at bedtime. 10/03/22   [name], MD    Allergies as of 11/07/2022   (No Known Allergies)    Family History  Problem Relation Age of Onset   Diabetes Mother        adult onset - geriatric   Alcohol abuse Father    Cancer Father        prostate   Stroke Father        mini strokes   Heart Problems Father    Other Maternal Aunt        born with her lung outside of her body; lived until 110.   Arthritis Maternal Grandmother     Social History   Socioeconomic History   Marital status: Divorced    Spouse name: Not on file   Number of children: 0   Years of education: Not on file   Highest education level: Bachelor's degree (e.g., BA, AB, BS)  Occupational History   Occupation: Retired  Tobacco Use   Smoking status: Never   Smokeless tobacco: Never  Vaping Use   Vaping status: Never Used  Substance and Sexual Activity   Alcohol use: Yes    Alcohol/week: 0.0 standard drinks of alcohol    Comment: a glass of wine/beer   Drug use: No   Sexual activity: Not Currently    Partners: Female  Other Topics Concern   Not on file  Social History Narrative   Not on file   Social Determinants of Health   Financial Resource Strain: Low Risk  (10/15/2022)   Overall Financial Resource Strain (CARDIA)    Difficulty of Paying Living Expenses: Not hard at all  Food Insecurity: No Food Insecurity  (10/15/2022)   Hunger Vital Sign    Worried About Running Out of Food in the Last Year: Never true    Ran Out of Food in the Last Year: Never true  Transportation Needs: No Transportation Needs (10/15/2022)   PRAPARE - Administrator, Civil Service (Medical): No    Lack of Transportation (Non-Medical): No  Physical Activity: Sufficiently Active (10/15/2022)   Exercise Vital Sign    Days of Exercise per Week: 3 days    Minutes of Exercise  per Session: 50 min  Stress: No Stress Concern Present (10/15/2022)   [employer] of Occupational Health - Occupational Stress Questionnaire    Feeling of Stress : Only a little  Social Connections: Unknown (10/15/2022)   Social Connection and Isolation Panel [NHANES]    Frequency of Communication with Friends and Family: More than three times a week    Frequency of Social Gatherings with Friends and Family: Three times a week    Attends Religious Services: Patient declined    Active Member of Clubs or Organizations: Yes    Attends Banker Meetings: Never    Marital Status: Divorced  Recent Concern: Social Connections - Socially Isolated (08/25/2022)   Social Connection and Isolation Panel [NHANES]    Frequency of Communication with Friends and Family: More than three times a week    Frequency of Social Gatherings with Friends and Family: More than three times a week    Attends Religious Services: Never    Database administrator or Organizations: No    Attends Banker Meetings: Never    Marital Status: Divorced  Catering manager Violence: Not At Risk (08/25/2022)   Humiliation, Afraid, Rape, and Kick questionnaire    Fear of Current or Ex-Partner: No    Emotionally Abused: No    Physically Abused: No    Sexually Abused: No    Review of Systems: See HPI, otherwise negative ROS  Physical Exam: BP (!) 155/88   Pulse 63   Temp (!) 96.6 F (35.9 C) (Temporal)   Resp 16   Ht 6' 3.5" (1.918 m)   Wt 111.6 kg    SpO2 100%   BMI 30.34 kg/m  General:   Alert,  pleasant and cooperative in NAD Head:  Normocephalic and atraumatic. Neck:  Supple; no masses or thyromegaly. Lungs:  Clear throughout to auscultation, normal respiratory effort.    Heart:  +S1, +S2, Regular rate and rhythm, No edema. Abdomen:  Soft, nontender and nondistended. Normal bowel sounds, without guarding, and without rebound.   Neurologic:  Alert and  oriented x4;  grossly normal neurologically.  Impression/Plan: Dean Sullivan is here for an colonoscopy to be performed for surveillance due to prior history of colon polyps   Risks, benefits, limitations, and alternatives regarding  colonoscopy have been reviewed with the patient.  Questions have been answered.  All parties agreeable.   Wyline Mood, MD  12/26/2022, 9:23 AM

## 2022-12-26 NOTE — Anesthesia Procedure Notes (Signed)
Procedure Name: MAC Date/Time: 12/26/2022 10:16 AM  Performed by: Lily Lovings, CRNAPre-anesthesia Checklist: Patient identified, Emergency Drugs available, Suction available, Patient being monitored and Timeout performed Patient Re-evaluated:Patient Re-evaluated prior to induction Induction Type: IV induction

## 2022-12-26 NOTE — Op Note (Signed)
[facility] Gastroenterology Patient Name: Dean Sullivan Procedure Date: 12/26/2022 10:06 AM MRN: [id number] Account #: [id number] Date of Birth: [birth year] Admit Type: Outpatient Age: 67 Room: [facility] ENDO ROOM 4 Gender: Male Note Status: Finalized Instrument Name: Nelda Marseille [id number] Procedure:             Colonoscopy Indications:           Surveillance: Personal history of adenomatous polyps                         on last colonoscopy > 3 years ago Providers:             Wyline Mood MD, MD Referring MD:          Onnie Boer. Sowles, MD (Referring MD) Medicines:             Monitored Anesthesia Care Complications:         No immediate complications. Procedure:             Pre-Anesthesia Assessment:                        - Prior to the procedure, a History and Physical was                         performed, and patient medications, allergies and                         sensitivities were reviewed. The patient's tolerance                         of previous anesthesia was reviewed.                        - The risks and benefits of the procedure and the                         sedation options and risks were discussed with the                         patient. All questions were answered and informed                         consent was obtained.                        - ASA Grade Assessment: II - A patient with mild                         systemic disease.                        After obtaining informed consent, the colonoscope was                         passed under direct vision. Throughout the procedure,                         the patient's blood pressure, pulse, and oxygen  saturations were monitored continuously. The                         Colonoscope was introduced through the anus and                         advanced to the the cecum, identified by the                         appendiceal orifice. The colonoscopy was performed                          with ease. The patient tolerated the procedure well.                         The quality of the bowel preparation was good. The                         ileocecal valve, appendiceal orifice, and rectum were                         photographed. Findings:      The perianal and digital rectal examinations were normal.      Multiple medium-mouthed diverticula were found in the left colon.      The exam was otherwise without abnormality on direct and retroflexion       views. Impression:            - Diverticulosis in the left colon.                        - The examination was otherwise normal on direct and                         retroflexion views.                        - No specimens collected. Recommendation:        - Discharge patient to home (with escort).                        - Resume previous diet.                        - Continue present medications.                        - Repeat colonoscopy in 10 years for surveillance. Procedure Code(s):     --- Professional ---                        [phone], Colonoscopy, flexible; diagnostic, including                         collection of specimen(s) by brushing or washing, when                         performed (separate procedure) Diagnosis Code(s):     --- Professional ---  Z86.010, Personal history of colonic polyps                        K57.30, Diverticulosis of large intestine without                         perforation or abscess without bleeding CPT copyright 2022 American Medical Association. All rights reserved. The codes documented in this report are preliminary and upon coder review may  be revised to meet current compliance requirements. Wyline Mood, MD Wyline Mood MD, MD 12/26/2022 10:36:56 AM This report has been signed electronically. Number of Addenda: 0 Note Initiated On: 12/26/2022 10:06 AM Scope Withdrawal Time: 0 hours 10 minutes 45 seconds  Total Procedure Duration: 0 hours 16  minutes 2 seconds  Estimated Blood Loss:  Estimated blood loss: none.      [facility]

## 2022-12-26 NOTE — Transfer of Care (Signed)
Immediate Anesthesia Transfer of Care Note  Patient: Dean Sullivan  Procedure(s) Performed: COLONOSCOPY WITH PROPOFOL  Patient Location: PACU and Endoscopy Unit  Anesthesia Type:General  Level of Consciousness: sedated  Airway & Oxygen Therapy: Patient Spontanous Breathing  Post-op Assessment: Report given to RN and Post -op Vital signs reviewed and stable  Post vital signs: Reviewed and stable  Last Vitals:  Vitals Value Taken Time  BP 103/65 12/26/22 1039  Temp    Pulse 72 12/26/22 1040  Resp 15 12/26/22 1040  SpO2 100 % 12/26/22 1040  Vitals shown include unfiled device data.  Last Pain:  Vitals:   12/26/22 1039  TempSrc:   PainSc: 0-No pain         Complications: No notable events documented.

## 2022-12-26 NOTE — Anesthesia Postprocedure Evaluation (Signed)
Anesthesia Post Note  Patient: Dean Sullivan  Procedure(s) Performed: COLONOSCOPY WITH PROPOFOL  Patient location during evaluation: PACU Anesthesia Type: General Level of consciousness: awake and awake and alert Pain management: satisfactory to patient Vital Signs Assessment: post-procedure vital signs reviewed and stable Respiratory status: nonlabored ventilation Cardiovascular status: blood pressure returned to baseline Anesthetic complications: no   No notable events documented.   Last Vitals:  Vitals:   12/26/22 1049 12/26/22 1059  BP: 109/72 (!) 113/97  Pulse: 71 68  Resp: 18 15  Temp:    SpO2: 100% 99%    Last Pain:  Vitals:   12/26/22 1039  TempSrc:   PainSc: 0-No pain                 VAN STAVEREN,Mylisa Brunson

## 2022-12-29 ENCOUNTER — Encounter: Payer: Self-pay | Admitting: Gastroenterology

## 2022-12-29 DIAGNOSIS — M47816 Spondylosis without myelopathy or radiculopathy, lumbar region: Secondary | ICD-10-CM | POA: Diagnosis not present

## 2023-01-02 DIAGNOSIS — R748 Abnormal levels of other serum enzymes: Secondary | ICD-10-CM | POA: Diagnosis not present

## 2023-01-06 ENCOUNTER — Telehealth: Payer: Self-pay

## 2023-01-06 ENCOUNTER — Other Ambulatory Visit: Payer: Self-pay | Admitting: *Deleted

## 2023-01-06 NOTE — Telephone Encounter (Signed)
Sent to [facility] to look over .

## 2023-01-06 NOTE — Telephone Encounter (Signed)
Pt states he needs a refill of testosterone sent  to [facility].    LV- 07/31/22  Pls advise.

## 2023-01-07 MED ORDER — JATENZO 158 MG PO CAPS: 316.0000 mg | ORAL_CAPSULE | Freq: Two times a day (BID) | ORAL | 0 refills | Status: DC

## 2023-01-07 NOTE — Telephone Encounter (Signed)
Patient left a message on triage line asking about the refill. He states that he is out of the medication as of today. Patient states that his pharmacy has reached out to [place] and he has x 2. please

## 2023-01-16 DIAGNOSIS — M5416 Radiculopathy, lumbar region: Secondary | ICD-10-CM | POA: Diagnosis not present

## 2023-01-30 ENCOUNTER — Other Ambulatory Visit: Payer: Medicare PPO

## 2023-01-30 DIAGNOSIS — E291 Testicular hypofunction: Secondary | ICD-10-CM | POA: Diagnosis not present

## 2023-01-31 LAB — HEMATOCRIT: Hematocrit: 48.8 % (ref 37.5–51.0)

## 2023-01-31 LAB — TESTOSTERONE: Testosterone: 171 ng/dL — ABNORMAL LOW (ref 264–916)

## 2023-02-04 DIAGNOSIS — M5416 Radiculopathy, lumbar region: Secondary | ICD-10-CM | POA: Diagnosis not present

## 2023-02-18 NOTE — Progress Notes (Signed)
Name: Dean Sullivan   MRN: [id number]    DOB: 06-02-1955   Date:02/24/2023       Progress Note  Subjective  Chief Complaint  Chief Complaint  Patient presents with   Medical Management of Chronic Issues    HPI  Discussed the use of AI scribe software for clinical note transcription with the patient, who gave verbal consent to proceed.  History of Present Illness   The patient, a 67 year old with a history of diabetes, dyslipidemia, hypertension, coronary artery disease, and chronic back pain, presented for a six-month follow-up. The patient's diabetes was diagnosed in November 2018 with an initial HbA1c of 6.7. Since then, the patient has managed the condition through lifestyle modifications, including dietary changes and weight loss, without the need for medication. The patient's HbA1c has consistently decreased over time, with recent readings around 5.7.  The patient reported a weight loss of nine pounds since the last visit, attributing it to increased physical activity related to home renovations and caring for his mother. The patient also reported a decrease in carbohydrate intake and an increase in protein and salad consumption. Despite the holiday season, the patient maintained his blood sugar levels within the normal range.  The patient has been experiencing chronic lower back pain, described as originating in the hip area and radiating down the right thigh. t. The patient had received a lumbar transforaminal steroid injection about a month ago, which improved his ability to stand and walk. The patient denied any surgical intervention for this issue.  The patient also reported a history of sleep apnea and has been using a CPAP machine for about 11 years. He expressed interest in having another sleep study to assess the need for a new machine or adjustments to the current one.  The patient has been on testosterone therapy due to low levels, which he receives through a urologist. He  reported feeling fatigued, which he attributes to the low testosterone levels. The patient had previously tried testosterone injections but stopped due to a significant increase in heart rate and blood pressure.  The patient also reported occasional stomach discomfort, which improved after starting Metamucil. He described frequent urination, particularly at night, but did not express any concerns about this. The patient also mentioned a persistent soreness in the left shoulder, which he attributed to a past rotator cuff surgery.  The patient has been managing his dyslipidemia with Repatha injections after discontinuing rosuvastatin due to elevated CKs. He has not experienced any chest pain, palpitations, or shortness of breath. The patient also mentioned taking sildenafil for erectile dysfunction but has not been sexually active recently.         Patient Active Problem List   Diagnosis Date Noted   History of colonic polyps 12/26/2022   Unstable angina (HCC) 09/19/2022   Neurogenic claudication due to lumbar spinal stenosis 08/25/2022   Degeneration of lumbar intervertebral disc 01/23/2020   Trigger finger of right hand 05/14/2018   CAD (coronary artery disease), native coronary artery 09/07/2017   Type 2 diabetes mellitus with obesity (HCC) 03/18/2017   Colon cancer screening 02/19/2017   Tear of left rotator cuff 01/26/2017   Complete tear of left rotator cuff 01/12/2017   Injury of tendon of long head of left biceps 01/12/2017   Rotator cuff tendinitis, left 01/12/2017   Acquired trigger finger 12/31/2016   Ganglion cyst 09/11/2016   Impingement syndrome of left shoulder region 09/11/2016   Radial styloid tenosynovitis 09/11/2016   Screening for prostate cancer 09/10/2016  Medication monitoring encounter 03/12/2016   Trigger finger of right hand 02/20/2015   Hyperlipidemia LDL goal <70 01/05/2015   Status post coronary artery stent placement 01/05/2015   Hypertension goal BP (blood  pressure) < 130/80 01/05/2015   Obesity 01/05/2015   Abdominal obesity-metabolic syndrome type 3 01/05/2015   Nasal sinus congestion 01/05/2015   Right foot pain 01/05/2015   Obstructive sleep apnea on CPAP 01/05/2015   Presence of coronary angioplasty implant and graft 05/05/2012   Chest pain 05/03/2012    Past Surgical History:  Procedure Laterality Date   CARDIAC CATHETERIZATION     x2 stents [facility]   CAROTID STENT INSERTION  2012   2   COLONOSCOPY WITH PROPOFOL N/A 04/21/2017   Procedure: COLONOSCOPY WITH PROPOFOL;  Surgeon: Wyline Mood, MD;  Location: [facility] ENDOSCOPY;  Service: Gastroenterology;  Laterality: N/A;   COLONOSCOPY WITH PROPOFOL N/A 12/26/2022   Procedure: COLONOSCOPY WITH PROPOFOL;  Surgeon: Wyline Mood, MD;  Location: [facility] ENDOSCOPY;  Service: Gastroenterology;  Laterality: N/A;   LEFT HEART CATH AND CORONARY ANGIOGRAPHY Left 09/23/2022   Procedure: LEFT HEART CATH AND CORONARY ANGIOGRAPHY;  Surgeon: Yvonne Kendall, MD;  Location: [facility] INVASIVE CV LAB;  Service: Cardiovascular;  Laterality: Left;   LIPOMA EXCISION Left 03/09/2017   Procedure: EXCISION LIPOMA;  Surgeon: Deeann Saint, MD;  Location: [facility] ORS;  Service: Orthopedics;  Laterality: Left;   PROSTATE BIOPSY  03/03/2018   RESECTION DISTAL CLAVICAL Left 03/09/2017   Procedure: RESECTION DISTAL CLAVICAL;  Surgeon: Deeann Saint, MD;  Location: [facility] ORS;  Service: Orthopedics;  Laterality: Left;   SHOULDER ARTHROSCOPY WITH OPEN ROTATOR CUFF REPAIR Left 03/09/2017   Procedure: SHOULDER ARTHROSCOPY WITH OPEN ROTATOR CUFF REPAIR;  Surgeon: Deeann Saint, MD;  Location: [facility] ORS;  Service: Orthopedics;  Laterality: Left;   SUBACROMIAL DECOMPRESSION Left 03/09/2017   Procedure: SUBACROMIAL DECOMPRESSION;  Surgeon: Deeann Saint, MD;  Location: [facility] ORS;  Service: Orthopedics;  Laterality: Left;  BICEPS TENOTOMY    Family History  Problem Relation Age of Onset   Diabetes Mother        adult onset -  geriatric   Alcohol abuse Father    Cancer Father        prostate   Stroke Father        mini strokes   Heart Problems Father    Other Maternal Aunt        born with her lung outside of her body; lived until 11.   Arthritis Maternal Grandmother     Social History   Tobacco Use   Smoking status: Never   Smokeless tobacco: Never  Substance Use Topics   Alcohol use: Yes    Alcohol/week: 0.0 standard drinks of alcohol    Comment: a glass of wine/beer     Current Outpatient Medications:    aspirin 81 MG tablet, Take 81 mg by mouth daily., Disp: , Rfl:    cetirizine (ZYRTEC) 10 MG tablet, Take 10 mg by mouth daily., Disp: , Rfl:    Cholecalciferol (VITAMIN D) 125 MCG (5000 UT) CAPS, Take 5,000 mcg by mouth daily., Disp: , Rfl:    cyanocobalamin (VITAMIN B12) 1000 MCG tablet, Take 1,000 mcg by mouth daily., Disp: , Rfl:    dorzolamide (TRUSOPT) 2 % ophthalmic solution, 1 drop 3 (three) times daily., Disp: , Rfl:    dutasteride (AVODART) 0.5 MG capsule, Take 1 capsule (0.5 mg total) by mouth daily., Disp: 150 capsule, Rfl: 0   Evolocumab (REPATHA SURECLICK) 140 MG/ML SOAJ, Inject  140 mg into the skin every 14 (fourteen) days., Disp: 2 mL, Rfl: 11   latanoprost (XALATAN) 0.005 % ophthalmic solution, Place 1 drop into both eyes at bedtime. , Disp: , Rfl:    losartan (COZAAR) 100 MG tablet, Take 1 tablet by mouth once daily, Disp: 90 tablet, Rfl: 3   MAGNESIUM PO, Take 1 capsule by mouth daily., Disp: , Rfl:    metoprolol succinate (TOPROL-XL) 25 MG 24 hr tablet, Take 1 tablet by mouth once daily, Disp: 30 tablet, Rfl: 10   nitroGLYCERIN (NITROSTAT) 0.4 MG SL tablet, Place 1 tablet (0.4 mg total) under the tongue every 5 (five) minutes as needed for chest pain., Disp: 25 tablet, Rfl: PRN   sildenafil (REVATIO) 20 MG tablet, Take 1 tablet (20 mg total) by mouth as needed., Disp: 90 tablet, Rfl: 0   Testosterone Undecanoate (JATENZO) 158 MG CAPS, Take 2 capsules (316 mg total) by mouth in  the morning and at bedtime., Disp: 360 capsule, Rfl: 0   VITAMIN E PO, Take by mouth daily., Disp: , Rfl:   No Known Allergies  I personally reviewed active problem list, medication list, allergies, family history with the patient/caregiver today.   ROS  Ten systems reviewed and is negative except as mentioned in HPI    Objective  Vitals:   02/24/23 0911  BP: 132/84  Pulse: 88  Resp: 16  Temp: 98.4 F (36.9 C)  TempSrc: Oral  SpO2: 98%  Weight: 242 lb 8 oz (110 kg)  Height: 6\' 3"  (1.905 m)    Body mass index is 30.31 kg/m.  Physical Exam  Constitutional: Patient appears well-developed and well-nourished. Obese  No distress.  HEENT: head atraumatic, normocephalic, pupils equal and reactive to light, neck supple Cardiovascular: Normal rate, regular rhythm and normal heart sounds.  No murmur heard. No BLE edema. Pulmonary/Chest: Effort normal and breath sounds normal. No respiratory distress. Abdominal: Soft.  There is no tenderness. Psychiatric: Patient has a normal mood and affect. behavior is normal. Judgment and thought content normal.   Recent Results (from the past 2160 hour(s))  Glucose, capillary     Status: None   Collection Time: 12/26/22  9:32 AM  Result Value Ref Range   Glucose-Capillary 81 70 - 99 mg/dL    Comment: Glucose reference range applies only to samples taken after fasting for at least 8 hours.  Testosterone     Status: Abnormal   Collection Time: 01/30/23 10:15 AM  Result Value Ref Range   Testosterone 171 (L) 264 - 916 ng/dL    Comment: Adult male reference interval is based on a population of healthy nonobese males (BMI <30) between 71 and 26 years old. Travison, et.al. JCEM [phone]. PMID: [id number].   Hematocrit     Status: None   Collection Time: 01/30/23 10:15 AM  Result Value Ref Range   Hematocrit 48.8 37.5 - 51.0 %  POCT glycosylated hemoglobin (Hb A1C)     Status: Abnormal   Collection Time: 02/24/23  9:27 AM  Result  Value Ref Range   Hemoglobin A1C 5.7 (A) 4.0 - 5.6 %   HbA1c POC (<> result, manual entry)     HbA1c, POC (prediabetic range)     HbA1c, POC (controlled diabetic range)      Diabetic Foot Exam: Diabetic Foot Exam - Simple   Simple Foot Form Visual Inspection See comments: Yes Sensation Testing Intact to touch and monofilament testing bilaterally: Yes Pulse Check Posterior Tibialis and Dorsalis pulse intact bilaterally: Yes  Comments Dry skin, callus formation       PHQ2/9:    02/24/2023    9:10 AM 11/06/2022   10:37 AM 10/15/2022   11:33 AM 08/25/2022    1:42 PM 08/25/2022    1:40 PM  Depression screen PHQ 2/9  Decreased Interest 0 0 0 0 0  Down, Depressed, Hopeless 0 0 0 0 0  PHQ - 2 Score 0 0 0 0 0  Altered sleeping 0 0 0 0   Tired, decreased energy 0 0 0 0   Change in appetite 0 0 0 0   Feeling bad or failure about yourself  0 0 0 0   Trouble concentrating 0 0 0 0   Moving slowly or fidgety/restless 0 0 0 0   Suicidal thoughts 0 0 0 0   PHQ-9 Score 0 0 0 0   Difficult doing work/chores Not difficult at all  Not difficult at all      phq 9 is negative   Fall Risk:    02/24/2023    9:10 AM 11/06/2022   10:37 AM 10/15/2022   11:33 AM 08/25/2022    1:34 PM 07/27/2018   10:26 AM  Fall Risk   Falls in the past year? 0 0 0 0 0  Number falls in past yr: 0 0 0  0  Injury with Fall? 0 0 0  0  Risk for fall due to : No Fall Risks No Fall Risks  No Fall Risks   Follow up Falls prevention discussed;Education provided;Falls evaluation completed Falls prevention discussed  Falls prevention discussed;Education provided;Falls evaluation completed      Assessment & Plan  Assessment and Plan    DM type 2 with associated HTN, dyslipidemia  A1c has improved from 6.7 to 5.7 with lifestyle modifications and weight loss. No medications for diabetes. -Continue current lifestyle modifications including moderate exercise and low carbohydrate diet.  Hypertension Managed with Losartan  and Metoprolol. No reported symptoms of chest pain, palpitations, or shortness of breath. -Continue current medication regimen.  Dyslipidemia Previously on Rosuvastatin, now managed with Repatha injections due to concern of low HDL levels with Rosuvastatin. -Continue Repatha injections.  Benign Prostatic Hyperplasia Managed with Dutasteride. Reports increased frequency of urination, particularly at night. -Continue Dutasteride.  Chronic Back Pain Reports improvement with cortisone shots. No current physical therapy due to worsening symptoms with therapy. -Continue current management with cortisone shots.  Sleep Apnea Managed with CPAP machine for 11 years. Recent weight loss may necessitate adjustment of CPAP settings. -Refer to [facility] for possible titration study.  Testosterone Deficiency Reports fatigue. Currently on oral testosterone therapy, previously on injectable therapy which was discontinued due to side effects. -Consider discussing alternative testosterone delivery methods with prescribing provider.  Coronary Artery Disease Managed with Nitroglycerin as needed. No reported chest pain. -Continue current management.  General Health Maintenance -Recommend Shingrix vaccine, two doses two months apart. -Declined flu vaccine due to previous adverse reactions. -Continue weight loss efforts. -Foot exam performed, advised on care for corns and calluses. -Follow-up in six months.

## 2023-02-24 ENCOUNTER — Telehealth: Payer: Self-pay | Admitting: Family Medicine

## 2023-02-24 ENCOUNTER — Encounter: Payer: Self-pay | Admitting: Family Medicine

## 2023-02-24 ENCOUNTER — Ambulatory Visit (INDEPENDENT_AMBULATORY_CARE_PROVIDER_SITE_OTHER): Payer: Medicare PPO | Admitting: Family Medicine

## 2023-02-24 VITALS — BP 132/84 | HR 88 | Temp 98.4°F | Resp 16 | Ht 75.0 in | Wt 242.5 lb

## 2023-02-24 DIAGNOSIS — I2510 Atherosclerotic heart disease of native coronary artery without angina pectoris: Secondary | ICD-10-CM | POA: Diagnosis not present

## 2023-02-24 DIAGNOSIS — M48062 Spinal stenosis, lumbar region with neurogenic claudication: Secondary | ICD-10-CM

## 2023-02-24 DIAGNOSIS — I700 Atherosclerosis of aorta: Secondary | ICD-10-CM | POA: Diagnosis not present

## 2023-02-24 DIAGNOSIS — Z23 Encounter for immunization: Secondary | ICD-10-CM

## 2023-02-24 DIAGNOSIS — E785 Hyperlipidemia, unspecified: Secondary | ICD-10-CM | POA: Diagnosis not present

## 2023-02-24 DIAGNOSIS — R748 Abnormal levels of other serum enzymes: Secondary | ICD-10-CM | POA: Diagnosis not present

## 2023-02-24 DIAGNOSIS — G4733 Obstructive sleep apnea (adult) (pediatric): Secondary | ICD-10-CM

## 2023-02-24 DIAGNOSIS — E1169 Type 2 diabetes mellitus with other specified complication: Secondary | ICD-10-CM

## 2023-02-24 LAB — POCT GLYCOSYLATED HEMOGLOBIN (HGB A1C): Hemoglobin A1C: 5.7 % — AB (ref 4.0–5.6)

## 2023-02-24 NOTE — Telephone Encounter (Signed)
Amy, from [facility], has called in regards to receiving a referral for patient for a sleep study but states that it does not specify which type of sleep study is needed, and [facility] needs an updated order faxed to fax # [phone].  [facility] callback # [phone]

## 2023-02-25 NOTE — Telephone Encounter (Signed)
Order is in your file to sign today.

## 2023-02-27 DIAGNOSIS — M5416 Radiculopathy, lumbar region: Secondary | ICD-10-CM | POA: Diagnosis not present

## 2023-03-04 ENCOUNTER — Telehealth: Payer: Self-pay | Admitting: Urology

## 2023-03-04 ENCOUNTER — Other Ambulatory Visit: Payer: Self-pay | Admitting: Cardiovascular Disease

## 2023-03-04 NOTE — Telephone Encounter (Signed)
Scheduled for follow-up with me May 2025.  Okay to refill dutasteride up until next appointment.

## 2023-03-04 NOTE — Telephone Encounter (Signed)
*  STAT* If patient is at the pharmacy, call can be transferred to refill team.   1. Which medications need to be refilled? (please list name of each medication and dose if known) dutesturide 0.5 mg  2. Would you like to learn more about the convenience, safety, & potential cost savings by using the [facility]? no     3. Are you open to using the [facility] (Type [facility]. no ).   4. Which pharmacy/location (including street and city if local pharmacy) is medication to be sent to?[facility] in [place] [address]   5. Do they need a 30 day or 90 day supply? 90

## 2023-03-04 NOTE — Telephone Encounter (Signed)
Patient called office to request refill for Dutasteride. He said that Dr. Windell Hummingbird office is wanting his urologist to prescribe medication since he has established care with our office. [facility] on [address] in [place].  Please advise patient.

## 2023-03-05 MED ORDER — DUTASTERIDE 0.5 MG PO CAPS: 0.5000 mg | ORAL_CAPSULE | Freq: Every day | ORAL | 1 refills | Status: DC

## 2023-03-05 NOTE — Telephone Encounter (Signed)
Pt aware.   Pt has lab and f/u appt for 5/25. Med erxed.

## 2023-03-05 NOTE — Addendum Note (Signed)
Addended by: Marchelle Folks on: 03/05/2023 10:10 AM   Modules accepted: Orders

## 2023-03-11 ENCOUNTER — Ambulatory Visit: Payer: Medicare PPO | Attending: Otolaryngology

## 2023-03-11 ENCOUNTER — Other Ambulatory Visit: Payer: Self-pay | Admitting: Cardiovascular Disease

## 2023-03-11 DIAGNOSIS — G4733 Obstructive sleep apnea (adult) (pediatric): Secondary | ICD-10-CM | POA: Diagnosis not present

## 2023-03-12 DIAGNOSIS — M5416 Radiculopathy, lumbar region: Secondary | ICD-10-CM | POA: Diagnosis not present

## 2023-03-13 ENCOUNTER — Ambulatory Visit: Payer: Medicare PPO | Admitting: Nurse Practitioner

## 2023-03-20 ENCOUNTER — Other Ambulatory Visit: Payer: Self-pay

## 2023-03-20 DIAGNOSIS — G4733 Obstructive sleep apnea (adult) (pediatric): Secondary | ICD-10-CM

## 2023-03-23 ENCOUNTER — Other Ambulatory Visit: Payer: Self-pay | Admitting: *Deleted

## 2023-03-24 ENCOUNTER — Telehealth: Payer: Self-pay | Admitting: Family Medicine

## 2023-03-24 MED ORDER — JATENZO 158 MG PO CAPS: 316.0000 mg | ORAL_CAPSULE | Freq: Two times a day (BID) | ORAL | 0 refills | Status: DC

## 2023-03-24 NOTE — Telephone Encounter (Signed)
 Copied from CRM [id number]. Topic: General - Inquiry >> Mar 24, 2023 10:02 AM Runell Gess P wrote: Reason for CRM: pt called with concerns about his sleep study test and about having to do a titrate testing done.  CB#  [phone]

## 2023-03-24 NOTE — Telephone Encounter (Signed)
 [facility] are faxing over sleep results.  Dr. Glenard will review and possibly send for a auto titration cpap machine.  Once report gets back and sowles is ok with this we can call patient and ask where he would like order sent b/c he said may want to pay out of pocket for machine?

## 2023-03-29 ENCOUNTER — Telehealth (INDEPENDENT_AMBULATORY_CARE_PROVIDER_SITE_OTHER): Payer: Medicare PPO | Admitting: Pulmonary Disease

## 2023-03-29 DIAGNOSIS — G4733 Obstructive sleep apnea (adult) (pediatric): Secondary | ICD-10-CM

## 2023-03-29 NOTE — Telephone Encounter (Signed)
 Study showed Mild OSA AHI 10 & low sat 88% Options include CPAP or dental appliance or simply weight loss alone

## 2023-03-30 DIAGNOSIS — H2513 Age-related nuclear cataract, bilateral: Secondary | ICD-10-CM | POA: Diagnosis not present

## 2023-03-30 DIAGNOSIS — H401131 Primary open-angle glaucoma, bilateral, mild stage: Secondary | ICD-10-CM | POA: Diagnosis not present

## 2023-03-30 NOTE — Telephone Encounter (Signed)
 Called [facility] they are faxing over sleep results. Dr. Glenard will review and possibly send for a auto titration cpap machine. Once report gets back and sowles is ok with this we can call patient and ask where he would like order sent b/c he said may want to pay out of pocket for machine?   Dean Sullivan spoke to patient he refused to do CPAP titration study. Does he even need to have a CPAP machine?

## 2023-03-30 NOTE — Telephone Encounter (Signed)
 Copied from CRM [id number]. Topic: General - Other >> Mar 30, 2023  4:25 PM Dean Sullivan wrote: Reason for CRM: The patient has called to request that a CPAP order be submitted for them   The patient is uncertain of which company should be used for the supply order and would like additional information when possible   Please contact further when available

## 2023-03-30 NOTE — Telephone Encounter (Signed)
 Talked to Dr. Carlynn Purl and patient and she will write rx for Auto titration Cpap machine.  Pt notified will get back to [place] on where he would like sent to.

## 2023-04-07 ENCOUNTER — Other Ambulatory Visit: Payer: Self-pay

## 2023-04-07 DIAGNOSIS — G4733 Obstructive sleep apnea (adult) (pediatric): Secondary | ICD-10-CM

## 2023-04-09 ENCOUNTER — Ambulatory Visit: Payer: Medicare PPO | Attending: Nurse Practitioner | Admitting: Nurse Practitioner

## 2023-04-09 ENCOUNTER — Other Ambulatory Visit: Payer: Self-pay | Admitting: Cardiovascular Disease

## 2023-04-09 ENCOUNTER — Encounter: Payer: Self-pay | Admitting: Nurse Practitioner

## 2023-04-09 VITALS — BP 172/84 | HR 74 | Ht 75.5 in | Wt 244.0 lb

## 2023-04-09 DIAGNOSIS — I2510 Atherosclerotic heart disease of native coronary artery without angina pectoris: Secondary | ICD-10-CM | POA: Diagnosis not present

## 2023-04-09 DIAGNOSIS — E785 Hyperlipidemia, unspecified: Secondary | ICD-10-CM

## 2023-04-09 DIAGNOSIS — G4733 Obstructive sleep apnea (adult) (pediatric): Secondary | ICD-10-CM | POA: Diagnosis not present

## 2023-04-09 DIAGNOSIS — I10 Essential (primary) hypertension: Secondary | ICD-10-CM

## 2023-04-09 MED ORDER — METOPROLOL SUCCINATE ER 50 MG PO TB24: 50.0000 mg | ORAL_TABLET | Freq: Every day | ORAL | 3 refills | Status: DC

## 2023-04-09 NOTE — Telephone Encounter (Signed)
Patient has appt today with Ward Givens, NP  Blanche East ou please refill if no changes are made

## 2023-04-09 NOTE — Progress Notes (Signed)
Office Visit    Patient Name: Dean Sullivan Date of Encounter: 04/09/2023  Primary Care Provider:  Alba Cory, MD Primary Cardiologist:  Dean Nordmann, MD  Chief Complaint    68 y.o. male with a history of CAD status post prior LAD stenting, hypertension, hyperlipidemia, diabetes, obesity, and sleep apnea on CPAP, who presents for CAD follow-up.  Past Medical History  Subjective   Past Medical History:  Diagnosis Date   Allergy    CAD (coronary artery disease)    a. 04/2012 PCI: LAD 70-75 (3.5x22 Resolute DES), RCA 60-95p/m (2.5x12 Resolute DES). EF 60%; b. 09/2022 Cath: LM nl, LAD patent p/m stent, 20d, D1 nl, LCX large, 55p/m, RCA 25p/m ISR, RPDA 80. EF 50-55%-->Med rx.   Coronary arteriosclerosis in native artery    GERD (gastroesophageal reflux disease)    occ   Glaucoma    Hearing loss of right ear    patient was told he had slight hearing loss   History of echocardiogram    a. 09/2018 Echo: EF 55-60%, no rwma, nl RV fxn, mild BAE; b. 09/2022 Echo: EF 60-65%, no rwma, nl RV fxn, mild MR.   History of kidney stones    History of trigger finger    bilateral, but mostly in right thumb   Hyperlipidemia    Hypertension    Irregular heart beat    Sleep apnea    cpap   Type 2 diabetes mellitus (HCC) 03/18/2017   A1c 6.28 Jan 2017   Past Surgical History:  Procedure Laterality Date   CARDIAC CATHETERIZATION     x2 stents [facility]   CAROTID STENT INSERTION  2012   2   COLONOSCOPY WITH PROPOFOL N/A 04/21/2017   Procedure: COLONOSCOPY WITH PROPOFOL;  Surgeon: Dean Mood, MD;  Location: [facility] ENDOSCOPY;  Service: Gastroenterology;  Laterality: N/A;   COLONOSCOPY WITH PROPOFOL N/A 12/26/2022   Procedure: COLONOSCOPY WITH PROPOFOL;  Surgeon: Dean Mood, MD;  Location: [facility] ENDOSCOPY;  Service: Gastroenterology;  Laterality: N/A;   LEFT HEART CATH AND CORONARY ANGIOGRAPHY Left 09/23/2022   Procedure: LEFT HEART CATH AND CORONARY ANGIOGRAPHY;  Surgeon: Dean Kendall, MD;  Location: [facility] INVASIVE CV LAB;  Service: Cardiovascular;  Laterality: Left;   LIPOMA EXCISION Left 03/09/2017   Procedure: EXCISION LIPOMA;  Surgeon: Dean Saint, MD;  Location: [facility] ORS;  Service: Orthopedics;  Laterality: Left;   PROSTATE BIOPSY  03/03/2018   RESECTION DISTAL CLAVICAL Left 03/09/2017   Procedure: RESECTION DISTAL CLAVICAL;  Surgeon: Dean Saint, MD;  Location: [facility] ORS;  Service: Orthopedics;  Laterality: Left;   SHOULDER ARTHROSCOPY WITH OPEN ROTATOR CUFF REPAIR Left 03/09/2017   Procedure: SHOULDER ARTHROSCOPY WITH OPEN ROTATOR CUFF REPAIR;  Surgeon: Dean Saint, MD;  Location: [facility] ORS;  Service: Orthopedics;  Laterality: Left;   SUBACROMIAL DECOMPRESSION Left 03/09/2017   Procedure: SUBACROMIAL DECOMPRESSION;  Surgeon: Dean Saint, MD;  Location: [facility] ORS;  Service: Orthopedics;  Laterality: Left;  BICEPS TENOTOMY    Allergies  No Known Allergies    History of Present Illness      68 y.o. y/o male with a past medical history including CAD, hypertension, hyperlipidemia, diabetes, obesity, and sleep apnea on CPAP.  He previously underwent PCI and stenting of the LAD and right coronary artery in February 2014.  In the setting of c/p, he underwent stress testing in 2020 which did not show any significant ischemia or scar but showed an EF of 30 to 44% with significant extracardiac activity.  Event monitoring in  June 2020 showed predominantly sinus rhythm with 2 brief episodes of SVT, occasional PACs and PVCs.  Echocardiogram in July 2020 showed normal LV function, and thus EF on stress testing was felt to be falsely low, and he was medically managed.  He was seen in June 2024 due to intermittent, sharp left-sided chest pain prompting urgent care evaluation, with normal workup.  At cardiology follow-up, he reported unstable anginal symptoms and decision was made to pursue diagnostic catheterization, which was performed in early July 2024, showing  patent stents in the LAD and RCA (minimal in-stent restenosis), and an 80% stenosis in the RPDA.  The RPDA was too small/distal for PCI medical therapy was recommended.  Follow-up echo showed EF 60 to 65% with no regional wall motion abnormalities, and mild MR.  In mid-2024, he required d/c of statin rx due to CK of 618 and he was referred to rheumatology.     Since his last visit, Dean Sullivan has done reasonably well from a cardiac standpoint.  He does not experience chest pain or dyspnea.  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  He has not been able to continue salsa dancing due to low back pain for which she has been receiving injections.  He is not interested in lumbar surgery.  He denies palpitations, PND, orthopnea, dizziness, syncope, edema, or early satiety.  His blood pressure is elevated today.  He does not routinely check this at home. Objective  Home Medications    Current Outpatient Medications  Medication Sig Dispense Refill   aspirin 81 MG tablet Take 81 mg by mouth daily.     cetirizine (ZYRTEC) 10 MG tablet Take 10 mg by mouth daily.     Cholecalciferol (VITAMIN D) 125 MCG (5000 UT) CAPS Take 5,000 mcg by mouth daily.     cyanocobalamin (VITAMIN B12) 1000 MCG tablet Take 1,000 mcg by mouth daily.     dorzolamide (TRUSOPT) 2 % ophthalmic solution 1 drop 3 (three) times daily.     dutasteride (AVODART) 0.5 MG capsule Take 1 capsule (0.5 mg total) by mouth daily. 90 capsule 1   Evolocumab (REPATHA SURECLICK) 140 MG/ML SOAJ Inject 140 mg into the skin every 14 (fourteen) days. 2 mL 11   latanoprost (XALATAN) 0.005 % ophthalmic solution Place 1 drop into both eyes at bedtime.      losartan (COZAAR) 100 MG tablet Take 1 tablet by mouth once daily 90 tablet 3   MAGNESIUM PO Take 1 capsule by mouth daily.     metoprolol succinate (TOPROL-XL) 25 MG  24 hr tablet Take 1 tablet by mouth once daily 30 tablet 0   nitroGLYCERIN (NITROSTAT) 0.4 MG SL tablet Place 1 tablet (0.4 mg total) under the tongue every 5 (five) minutes as needed for chest pain. 25 tablet PRN   sildenafil (REVATIO) 20 MG tablet Take 1 tablet (20 mg total) by mouth as needed. 90 tablet 0   Testosterone Undecanoate (JATENZO) 158 MG CAPS Take 2 capsules (316 mg total) by mouth in the morning and at bedtime. 360 capsule 0   VITAMIN E PO Take by mouth daily.     No current facility-administered medications for this visit.     Physical Exam    VS:  BP (!) 140/68 (BP Location: Left Arm, Patient Position: Sitting, Cuff Size: Normal)   Pulse 74   Ht  6' 3.5" (1.918 m)   Wt 244 lb (110.7 kg)   SpO2 97%   BMI 30.10 kg/m  , BMI Body mass index is 30.1 kg/m.     Vitals:   04/09/23 1528 04/09/23 1746  BP: (!) 140/68 (!) 172/84  Pulse: 74   SpO2: 97%       GEN: Well nourished, well developed, in no acute distress. HEENT: normal. Neck: Supple, no JVD, carotid bruits, or masses. Cardiac: RRR, no murmurs, rubs, or gallops. No clubbing, cyanosis, edema.  Radials 2+/PT 2+ and equal bilaterally.  Respiratory:  Respirations regular and unlabored, clear to auscultation bilaterally. GI: Soft, nontender, nondistended, BS + x 4. MS: no deformity or atrophy. Skin: warm and dry, no rash. Neuro:  Strength and sensation are intact. Psych: Normal affect.  Accessory Clinical Findings    ECG personally reviewed by me today - EKG Interpretation Date/Time:  Thursday April 09 2023 15:35:48 EST Ventricular Rate:  74 PR Interval:  174 QRS Duration:  86 QT Interval:  378 QTC Calculation: 419 R Axis:   25  Text Interpretation: Sinus rhythm with occasional Premature ventricular complexes Confirmed by Nicolasa Ducking [phone]) on 04/09/2023 3:43:10 PM  - no acute changes.  Lab Results  Component Value Date   WBC 5.3 10/15/2022   HGB 15.9 10/15/2022   HCT 48.8 01/30/2023   MCV  87.0 10/15/2022   PLT 240 10/15/2022   Lab Results  Component Value Date   CREATININE 1.06 10/15/2022   BUN 14 10/15/2022   NA 140 10/15/2022   K 3.9 10/15/2022   CL 105 10/15/2022   CO2 25 10/15/2022   Lab Results  Component Value Date   ALT 20 10/15/2022   AST 19 10/15/2022   ALKPHOS 51 09/17/2022   BILITOT 1.1 10/15/2022   Lab Results  Component Value Date   CHOL 88 08/25/2022   HDL 45 08/25/2022   LDLCALC 24 08/25/2022   TRIG 108 08/25/2022   CHOLHDL 2.0 08/25/2022    Lab Results  Component Value Date   HGBA1C 5.7 (A) 02/24/2023   Lab Results  Component Value Date   TSH 1.343 09/10/2018       Assessment & Plan    1.  Precordial chest pain/CAD: Status post prior LAD and RCA stenting in 2014, with cath in July 2024 showing patent stents and 85% distal stenosis in the PDA, which was felt to be too small and too distal upon which to intervene.  He has been doing well without chest pain or dyspnea and remains on aspirin, beta-blocker, ARB, and Repatha therapy.  Increasing beta-blocker today in the setting of hypertension.  2.  Primary hypertension: Blood pressure elevated.  Increasing Toprol-XL to 50 mg daily.  He remains on losartan.  He will submit blood pressure and heart rate trends via MyChart in the next 2 weeks for [place] to reevaluate.  3.  Hyperlipidemia: Statin intolerant but on Repatha with an LDL of 24 in June 2024.  Tolerating well.  4.  Obstructive sleep apnea: Compliant with CPAP.  5.  Disposition: Patient to contact us with blood pressure trends.  Pending stability, we will tentatively plan on 36-month follow-up.  Nicolasa Ducking, NP 04/09/2023, 3:46 PM

## 2023-04-09 NOTE — Patient Instructions (Addendum)
Medication Instructions:  Your physician recommends the following medication changes.  INCREASE: Toprol-XL 50 mg daily    *If you need a refill on your cardiac medications before your next appointment, please call your pharmacy*   Lab Work: None ordered at this time  If you have labs (blood work) drawn today and your tests are completely normal, you will receive your results only by: MyChart Message (if you have MyChart) OR A paper copy in the mail If you have any lab test that is abnormal or we need to change your treatment, we will call you to review the results.   MyChart message [place] in 2 weeks with daily blood pressure and heart rate readings   Follow-Up: At [facility], you and your health needs are our priority.  As part of our continuing mission to provide you with exceptional heart care, we have created designated Provider Care Teams.  These Care Teams include your primary Cardiologist (physician) and Advanced Practice Providers (APPs -  Physician Assistants and Nurse Practitioners) who all work together to provide you with the care you need, when you need it.  We recommend signing up for the patient portal called "MyChart".  Sign up information is provided on this After Visit Summary.  MyChart is used to connect with patients for Virtual Visits (Telemedicine).  Patients are able to view lab/test results, encounter notes, upcoming appointments, etc.  Non-urgent messages can be sent to your provider as well.   To learn more about what you can do with MyChart, go to [email or web address].    Your next appointment:   6 month(s)  Provider:   You may see Julien Nordmann, MD or one of the following Advanced Practice Providers on your designated Care Team:   Nicolasa Ducking, NP

## 2023-04-10 DIAGNOSIS — M19042 Primary osteoarthritis, left hand: Secondary | ICD-10-CM | POA: Diagnosis not present

## 2023-04-10 DIAGNOSIS — M47816 Spondylosis without myelopathy or radiculopathy, lumbar region: Secondary | ICD-10-CM | POA: Diagnosis not present

## 2023-04-10 DIAGNOSIS — R748 Abnormal levels of other serum enzymes: Secondary | ICD-10-CM | POA: Diagnosis not present

## 2023-04-10 DIAGNOSIS — M19041 Primary osteoarthritis, right hand: Secondary | ICD-10-CM | POA: Diagnosis not present

## 2023-04-27 ENCOUNTER — Other Ambulatory Visit: Payer: Self-pay | Admitting: Cardiovascular Disease

## 2023-05-19 ENCOUNTER — Other Ambulatory Visit: Payer: Self-pay

## 2023-05-19 MED ORDER — REPATHA SURECLICK 140 MG/ML ~~LOC~~ SOAJ: 1.0000 | SUBCUTANEOUS | 11 refills | Status: DC

## 2023-06-02 ENCOUNTER — Telehealth: Payer: Self-pay | Admitting: Family Medicine

## 2023-06-02 NOTE — Telephone Encounter (Unsigned)
 Copied from CRM [id number]. Topic: Clinical - Medical Advice >> Jun 02, 2023 11:21 AM Clayton Bibles wrote: Reason for CRM: Dean Sullivan would  like a call back to discuss the auto titration CPAP machine. To his understanding, [facility] is still waiting for Dr. Carlynn Purl to send in auto settings for CPAP machine.  Please call him back at [phone] any time today.  Thanks

## 2023-06-03 NOTE — Telephone Encounter (Signed)
 Copied from CRM [id number]. Topic: General - Call Back - No Documentation >> Jun 03, 2023 11:03 AM Higinio Roger wrote: Reason for CRM: Patient is requesting a callback only from Dr. Carlynn Purl. He does not want to speak with a nurse or an Geophysicist/field seismologist. He wants to speak about getting his CPAP machine.  Callback #: [id number].

## 2023-06-03 NOTE — Telephone Encounter (Signed)
 Spoke to patient, advised per sleep study pt will need to go back to do CPAP titration therapy study. Patient refused to go due to being upset that patient should have done it on his first visit rather than going twice. Patient will let us know to put in the new referral to do it if he decides to. Patient verbalized will get with his insurance and contact the CPAP supply store. I verbalized he will need to do first the CPAP study again to be able to obtain CPAP supplies.

## 2023-06-04 ENCOUNTER — Other Ambulatory Visit: Payer: Self-pay

## 2023-06-04 DIAGNOSIS — G4733 Obstructive sleep apnea (adult) (pediatric): Secondary | ICD-10-CM

## 2023-06-04 NOTE — Telephone Encounter (Signed)
 Working on it.

## 2023-06-04 NOTE — Telephone Encounter (Signed)
Faxed out

## 2023-06-18 ENCOUNTER — Other Ambulatory Visit: Payer: Self-pay | Admitting: *Deleted

## 2023-06-21 MED ORDER — JATENZO 158 MG PO CAPS: 316.0000 mg | ORAL_CAPSULE | Freq: Two times a day (BID) | ORAL | 0 refills | Status: DC

## 2023-07-22 ENCOUNTER — Other Ambulatory Visit: Payer: Self-pay | Admitting: Cardiovascular Disease

## 2023-07-23 ENCOUNTER — Other Ambulatory Visit: Payer: Self-pay | Admitting: Cardiovascular Disease

## 2023-07-31 ENCOUNTER — Other Ambulatory Visit: Payer: Self-pay

## 2023-07-31 DIAGNOSIS — E291 Testicular hypofunction: Secondary | ICD-10-CM

## 2023-08-01 LAB — PSA: Prostate Specific Ag, Serum: 2.8 ng/mL (ref 0.0–4.0)

## 2023-08-01 LAB — HEMATOCRIT: Hematocrit: 47.9 % (ref 37.5–51.0)

## 2023-08-01 LAB — TESTOSTERONE: Testosterone: 434 ng/dL (ref 264–916)

## 2023-08-05 ENCOUNTER — Ambulatory Visit: Payer: Self-pay | Admitting: Urology

## 2023-08-05 ENCOUNTER — Encounter: Payer: Self-pay | Admitting: Urology

## 2023-08-05 VITALS — BP 165/88 | HR 83 | Ht 75.0 in | Wt 238.0 lb

## 2023-08-05 DIAGNOSIS — N401 Enlarged prostate with lower urinary tract symptoms: Secondary | ICD-10-CM | POA: Diagnosis not present

## 2023-08-05 DIAGNOSIS — E291 Testicular hypofunction: Secondary | ICD-10-CM

## 2023-08-05 NOTE — Progress Notes (Signed)
 I, Maysun Jamey Mccallum, acting as a Neurosurgeon for Geraline Knapp, MD., have documented all relevant documentation on the behalf of Geraline Knapp, MD, as directed by Geraline Knapp, MD while in the presence of Geraline Knapp, MD.  Discussed the use of AI scribe software for clinical note transcription with the patient, who gave verbal consent to proceed.   08/05/2023 3:47 PM   Dean Sullivan Mar 14, 1956 [id number]  Referring provider: [facility] [address] [place],  [place] [address]  Chief Complaint  Patient presents with   Benign Prostatic Hypertrophy    HPI: Dean Sullivan is a 68 y.o. male presents for annual follow-up.   Refer to my previous note 07/31/2022 for a clinical summary.  Remains on Jatenzo  with good energy levels. Using Sildenafil  prn He feels his testicular size has decreased. He also states he has difficulty achieving orgasm with a partner, however has no problems with masturbation. Remains on dutasteride  Labs 07/31/23 testosterone  434, PSA 2.8, HCT 47.9  PSA trend   Prostate Specific Ag, Serum  Latest Ref Rng 0.0 - 4.0 ng/mL  07/31/2022 2.7   07/31/2023 2.8      PMH: Past Medical History:  Diagnosis Date   Allergy    CAD (coronary artery disease)    a. 04/2012 PCI: LAD 70-75 (3.5x22 Resolute DES), RCA 60-95p/m (2.5x12 Resolute DES). EF 60%; b. 09/2022 Cath: LM nl, LAD patent p/m stent, 20d, D1 nl, LCX large, 55p/m, RCA 25p/m ISR, RPDA 80. EF 50-55%-->Med rx.   Coronary arteriosclerosis in native artery    GERD (gastroesophageal reflux disease)    occ   Glaucoma    Hearing loss of right ear    patient was told he had slight hearing loss   History of echocardiogram    a. 09/2018 Echo: EF 55-60%, no rwma, nl RV fxn, mild BAE; b. 09/2022 Echo: EF 60-65%, no rwma, nl RV fxn, mild MR.   History of kidney stones    History of trigger finger    bilateral, but mostly in right thumb   Hyperlipidemia    Hypertension     Irregular heart beat    Sleep apnea    cpap   Type 2 diabetes mellitus (HCC) 03/18/2017   A1c 6.28 Jan 2017    Surgical History: Past Surgical History:  Procedure Laterality Date   CARDIAC CATHETERIZATION     x2 stents [facility]   CAROTID STENT INSERTION  2012   2   COLONOSCOPY WITH PROPOFOL  N/A 04/21/2017   Procedure: COLONOSCOPY WITH PROPOFOL ;  Surgeon: Luke Salaam, MD;  Location: [facility] ENDOSCOPY;  Service: Gastroenterology;  Laterality: N/A;   COLONOSCOPY WITH PROPOFOL  N/A 12/26/2022   Procedure: COLONOSCOPY WITH PROPOFOL ;  Surgeon: Luke Salaam, MD;  Location: [facility] ENDOSCOPY;  Service: Gastroenterology;  Laterality: N/A;   LEFT HEART CATH AND CORONARY ANGIOGRAPHY Left 09/23/2022   Procedure: LEFT HEART CATH AND CORONARY ANGIOGRAPHY;  Surgeon: Sammy Crisp, MD;  Location: [facility] INVASIVE CV LAB;  Service: Cardiovascular;  Laterality: Left;   LIPOMA EXCISION Left 03/09/2017   Procedure: EXCISION LIPOMA;  Surgeon: Marlynn Singer, MD;  Location: [facility] ORS;  Service: Orthopedics;  Laterality: Left;   PROSTATE BIOPSY  03/03/2018   RESECTION DISTAL CLAVICAL Left 03/09/2017   Procedure: RESECTION DISTAL CLAVICAL;  Surgeon: Marlynn Singer, MD;  Location: [facility] ORS;  Service: Orthopedics;  Laterality: Left;   SHOULDER ARTHROSCOPY WITH OPEN ROTATOR CUFF REPAIR Left 03/09/2017   Procedure: SHOULDER ARTHROSCOPY WITH OPEN ROTATOR  CUFF REPAIR;  Surgeon: Marlynn Singer, MD;  Location: [facility] ORS;  Service: Orthopedics;  Laterality: Left;   SUBACROMIAL DECOMPRESSION Left 03/09/2017   Procedure: SUBACROMIAL DECOMPRESSION;  Surgeon: Marlynn Singer, MD;  Location: [facility] ORS;  Service: Orthopedics;  Laterality: Left;  BICEPS TENOTOMY    Home Medications:  Allergies as of 08/05/2023   No Known Allergies      Medication List        Accurate as of Aug 05, 2023  3:47 PM. If you have any questions, ask your nurse or doctor.          aspirin  81 MG tablet Take 81 mg by mouth daily.    cetirizine 10 MG tablet Commonly known as: ZYRTEC Take 10 mg by mouth daily.   cyanocobalamin  1000 MCG tablet Commonly known as: VITAMIN B12 Take 1,000 mcg by mouth daily.   dorzolamide 2 % ophthalmic solution Commonly known as: TRUSOPT 1 drop 3 (three) times daily.   dutasteride  0.5 MG capsule Commonly known as: AVODART  Take 1 capsule (0.5 mg total) by mouth daily.   Jatenzo  158 MG Caps Generic drug: Testosterone  Undecanoate Take 2 capsules (316 mg total) by mouth in the morning and at bedtime.   latanoprost 0.005 % ophthalmic solution Commonly known as: XALATAN Place 1 drop into both eyes at bedtime.   losartan  100 MG tablet Commonly known as: COZAAR  Take 1 tablet by mouth once daily   MAGNESIUM PO Take 1 capsule by mouth daily.   metoprolol  succinate 50 MG 24 hr tablet Commonly known as: TOPROL -XL Take 1 tablet (50 mg total) by mouth daily.   nitroGLYCERIN  0.4 MG SL tablet Commonly known as: Nitrostat  Place 1 tablet (0.4 mg total) under the tongue every 5 (five) minutes as needed for chest pain.   Repatha  SureClick 140 MG/ML Soaj Generic drug: Evolocumab  Inject 140 mg into the skin every 14 (fourteen) days.   sildenafil  20 MG tablet Commonly known as: REVATIO  Take 1 tablet (20 mg total) by mouth as needed.   Vitamin D  125 MCG (5000 UT) Caps Take 5,000 mcg by mouth daily.   VITAMIN E PO Take by mouth daily.        Allergies: No Known Allergies  Family History: Family History  Problem Relation Age of Onset   Diabetes Mother        adult onset - geriatric   Alcohol abuse Father    Cancer Father        prostate   Stroke Father        mini strokes   Heart Problems Father    Other Maternal Aunt        born with her lung outside of her body; lived until 41.   Arthritis Maternal Grandmother     Social History:  reports that he has never smoked. He has never used smokeless tobacco. He reports current alcohol use. He reports that he does not use  drugs.   Physical Exam: BP (!) 165/88   Pulse 83   Ht 6\' 3"  (1.905 m)   Wt 238 lb (108 kg)   BMI 29.75 kg/m   Constitutional:  Alert and oriented, No acute distress. HEENT: [place] AT Respiratory: Normal respiratory effort, no increased work of breathing. Psychiatric: Normal mood and affect.   Assessment & Plan:    1. Hypogonadism Continued Jatenzo . This level was not drawn in the window of 4-6 hours prior to his morning dose and will schedule a lab visit in 6 months for a late-morning  blood draw, and he will take his Jatenzo  in the morning, within that 4-6 hour window Lab visit in 6 months, testosterone /hematocrit Office visit one year for testosterone , hematocrit, PSA  2. BPH with LUTS Stable  I have reviewed the above documentation for accuracy and completeness, and I agree with the above.   Geraline Knapp, MD  [facility] [address], [address] [place], [place] [address] [phone]

## 2023-08-18 ENCOUNTER — Other Ambulatory Visit: Payer: Self-pay | Admitting: Urology

## 2023-08-28 ENCOUNTER — Encounter: Payer: Self-pay | Admitting: Family Medicine

## 2023-08-28 ENCOUNTER — Ambulatory Visit: Payer: Self-pay | Admitting: Family Medicine

## 2023-08-28 VITALS — BP 130/82 | HR 85 | Resp 16 | Ht 75.0 in | Wt 240.8 lb

## 2023-08-28 DIAGNOSIS — H9311 Tinnitus, right ear: Secondary | ICD-10-CM

## 2023-08-28 DIAGNOSIS — E1169 Type 2 diabetes mellitus with other specified complication: Secondary | ICD-10-CM

## 2023-08-28 DIAGNOSIS — M7918 Myalgia, other site: Secondary | ICD-10-CM | POA: Diagnosis not present

## 2023-08-28 DIAGNOSIS — G4733 Obstructive sleep apnea (adult) (pediatric): Secondary | ICD-10-CM

## 2023-08-28 DIAGNOSIS — I10 Essential (primary) hypertension: Secondary | ICD-10-CM

## 2023-08-28 DIAGNOSIS — E785 Hyperlipidemia, unspecified: Secondary | ICD-10-CM

## 2023-08-28 DIAGNOSIS — N401 Enlarged prostate with lower urinary tract symptoms: Secondary | ICD-10-CM

## 2023-08-28 DIAGNOSIS — I700 Atherosclerosis of aorta: Secondary | ICD-10-CM | POA: Diagnosis not present

## 2023-08-28 DIAGNOSIS — R972 Elevated prostate specific antigen [PSA]: Secondary | ICD-10-CM

## 2023-08-28 DIAGNOSIS — I2510 Atherosclerotic heart disease of native coronary artery without angina pectoris: Secondary | ICD-10-CM

## 2023-08-28 DIAGNOSIS — M48062 Spinal stenosis, lumbar region with neurogenic claudication: Secondary | ICD-10-CM

## 2023-08-28 LAB — POCT GLYCOSYLATED HEMOGLOBIN (HGB A1C): Hemoglobin A1C: 5.5 % (ref 4.0–5.6)

## 2023-08-28 MED ORDER — FLUTICASONE PROPIONATE 50 MCG/ACT NA SUSP: 2.0000 | Freq: Every day | NASAL | 0 refills | Status: AC

## 2023-08-28 NOTE — Progress Notes (Signed)
 Name: Marvie Calender   MRN: [id number]    DOB: February 22, 1956   Date:08/28/2023       Progress Note  Subjective  Chief Complaint  Chief Complaint  Patient presents with   Medical Management of Chronic Issues   Discussed the use of AI scribe software for clinical note transcription with the patient, who gave verbal consent to proceed.  History of Present Illness Adal Sereno is a 68 year old male with diabetes, hypertension, and coronary artery disease who presents for a follow-up visit.  Three days ago, he experienced pain in the left buttock after pulling a branch while working in the yard. He describes the pain as a popping sensation at the bottom of his buttock, which has improved but remains sore. The pain initially made it difficult to sleep and was exacerbated by leaning forward and sitting down. No bruising is present, but there is tight swelling in the area. He has been using a muscle relaxer at night, which helps with sleep.  He has a history of sciatica and is scheduled to receive his third cortisone shot on his spine on the 11th. He has previously used gabapentin  but discontinued it due to severe cramps. He is under the care of a doctor at  [facility] for his back issues.  He has been experiencing tinnitus in his right ear, described as a drumming sound when leaning over, which resolves after a few days away from [place]. He has not used Flonase in over a week despite being advised to use nasal sprays and allergy pills for water in the inner ear.  He manages his diabetes with diet alone, and his A1c is currently 5.5. No symptoms of polyuria, polydipsia, or polyphagia are present. He takes dutasteride  for an enlarged prostate, which causes him to urinate frequently, especially at night.  He has a history of coronary artery disease with two stents placed. No chest pain, shortness of breath, or dizziness. He takes aspirin , metoprolol , and losartan  for his heart condition and  hypertension. He uses Repatha  for cholesterol management due to previous issues with statins.  He uses a CPAP machine for obstructive sleep apnea, reporting low episodes per hour and is accustomed to using the machine nightly.    Patient Active Problem List   Diagnosis Date Noted   Atherosclerosis of abdominal aorta (HCC) 08/28/2023   Tinnitus, right ear 08/28/2023   History of colonic polyps 12/26/2022   Unstable angina (HCC) 09/19/2022   Spinal stenosis of lumbar region 09/03/2022   Neurogenic claudication due to lumbar spinal stenosis 08/25/2022   Lumbar spondylosis 01/23/2020   Trigger finger of right hand 05/14/2018   CAD (coronary artery disease), native coronary artery 09/07/2017   Dyslipidemia associated with type 2 diabetes mellitus (HCC) 03/18/2017   Colon cancer screening 02/19/2017   Tear of left rotator cuff 01/26/2017   Complete tear of left rotator cuff 01/12/2017   Injury of tendon of long head of left biceps 01/12/2017   Rotator cuff tendinitis, left 01/12/2017   Acquired trigger finger 12/31/2016   Ganglion cyst 09/11/2016   Impingement syndrome of left shoulder region 09/11/2016   Radial styloid tenosynovitis 09/11/2016   Screening for prostate cancer 09/10/2016   Medication monitoring encounter 03/12/2016   Trigger finger of right hand 02/20/2015   Hyperlipidemia LDL goal <70 01/05/2015   Status post coronary artery stent placement 01/05/2015   Hypertension goal BP (blood pressure) < 130/80 01/05/2015   Obesity 01/05/2015   Right foot pain 01/05/2015  Obstructive sleep apnea on CPAP 01/05/2015   Presence of coronary angioplasty implant and graft 05/05/2012    Past Surgical History:  Procedure Laterality Date   CARDIAC CATHETERIZATION     x2 stents [facility]   CAROTID STENT INSERTION  2012   2   COLONOSCOPY WITH PROPOFOL  N/A 04/21/2017   Procedure: COLONOSCOPY WITH PROPOFOL ;  Surgeon: Luke Salaam, MD;  Location: [facility] ENDOSCOPY;  Service:  Gastroenterology;  Laterality: N/A;   COLONOSCOPY WITH PROPOFOL  N/A 12/26/2022   Procedure: COLONOSCOPY WITH PROPOFOL ;  Surgeon: Luke Salaam, MD;  Location: [facility] ENDOSCOPY;  Service: Gastroenterology;  Laterality: N/A;   LEFT HEART CATH AND CORONARY ANGIOGRAPHY Left 09/23/2022   Procedure: LEFT HEART CATH AND CORONARY ANGIOGRAPHY;  Surgeon: Sammy Crisp, MD;  Location: [facility] INVASIVE CV LAB;  Service: Cardiovascular;  Laterality: Left;   LIPOMA EXCISION Left 03/09/2017   Procedure: EXCISION LIPOMA;  Surgeon: Marlynn Singer, MD;  Location: [facility] ORS;  Service: Orthopedics;  Laterality: Left;   PROSTATE BIOPSY  03/03/2018   RESECTION DISTAL CLAVICAL Left 03/09/2017   Procedure: RESECTION DISTAL CLAVICAL;  Surgeon: Marlynn Singer, MD;  Location: [facility] ORS;  Service: Orthopedics;  Laterality: Left;   SHOULDER ARTHROSCOPY WITH OPEN ROTATOR CUFF REPAIR Left 03/09/2017   Procedure: SHOULDER ARTHROSCOPY WITH OPEN ROTATOR CUFF REPAIR;  Surgeon: Marlynn Singer, MD;  Location: [facility] ORS;  Service: Orthopedics;  Laterality: Left;   SUBACROMIAL DECOMPRESSION Left 03/09/2017   Procedure: SUBACROMIAL DECOMPRESSION;  Surgeon: Marlynn Singer, MD;  Location: [facility] ORS;  Service: Orthopedics;  Laterality: Left;  BICEPS TENOTOMY    Family History  Problem Relation Age of Onset   Diabetes Mother        adult onset - geriatric   Alcohol abuse Father    Cancer Father        prostate   Stroke Father        mini strokes   Heart Problems Father    Other Maternal Aunt        born with her lung outside of her body; lived until 73.   Arthritis Maternal Grandmother     Social History   Tobacco Use   Smoking status: Never   Smokeless tobacco: Never  Substance Use Topics   Alcohol use: Yes    Alcohol/week: 0.0 standard drinks of alcohol    Comment: a glass of wine/beer     Current Outpatient Medications:    aspirin  81 MG tablet, Take 81 mg by mouth daily., Disp: , Rfl:    cetirizine (ZYRTEC) 10 MG tablet,  Take 10 mg by mouth daily., Disp: , Rfl:    Cholecalciferol (VITAMIN D ) 125 MCG (5000 UT) CAPS, Take 5,000 mcg by mouth daily., Disp: , Rfl:    cyanocobalamin  (VITAMIN B12) 1000 MCG tablet, Take 1,000 mcg by mouth daily., Disp: , Rfl:    dorzolamide (TRUSOPT) 2 % ophthalmic solution, 1 drop 3 (three) times daily., Disp: , Rfl:    dutasteride  (AVODART ) 0.5 MG capsule, TAKE 1 CAPSULE (0.5 MG) BY MOUTH DAILY, Disp: 90 capsule, Rfl: 0   Evolocumab  (REPATHA  SURECLICK) 140 MG/ML SOAJ, Inject 140 mg into the skin every 14 (fourteen) days., Disp: 2 mL, Rfl: 11   latanoprost (XALATAN) 0.005 % ophthalmic solution, Place 1 drop into both eyes at bedtime. , Disp: , Rfl:    losartan  (COZAAR ) 100 MG tablet, Take 1 tablet by mouth once daily, Disp: 90 tablet, Rfl: 3   metoprolol  succinate (TOPROL -XL) 50 MG 24 hr tablet, Take 1 tablet (50 mg total)  by mouth daily., Disp: 90 tablet, Rfl: 3   nitroGLYCERIN  (NITROSTAT ) 0.4 MG SL tablet, Place 1 tablet (0.4 mg total) under the tongue every 5 (five) minutes as needed for chest pain., Disp: 25 tablet, Rfl: PRN   sildenafil  (REVATIO ) 20 MG tablet, Take 1 tablet (20 mg total) by mouth as needed., Disp: 90 tablet, Rfl: 0   Testosterone  Undecanoate (JATENZO ) 158 MG CAPS, Take 2 capsules (316 mg total) by mouth in the morning and at bedtime., Disp: 360 capsule, Rfl: 0   tiZANidine (ZANAFLEX) 2 MG tablet, Take between 1mg  (half a tablet) and 4mg  (two tablets) up to three times daily as needed for MSK pain., Disp: , Rfl:    VITAMIN E PO, Take by mouth daily., Disp: , Rfl:    fluticasone (FLONASE) 50 MCG/ACT nasal spray, Place 2 sprays into both nostrils daily., Disp: 48 mL, Rfl: 0  No Known Allergies  I personally reviewed active problem list, medication list, allergies with the patient/caregiver today.   ROS  Ten systems reviewed and is negative except as mentioned in HPI    Objective Physical Exam  Constitutional: Patient appears well-developed and well-nourished.  Obese  No distress.  HEENT: head atraumatic, normocephalic, pupils equal and reactive to light, neck supple Cardiovascular: Normal rate, regular rhythm and normal heart sounds.  No murmur heard. No BLE edema. Pulmonary/Chest: Effort normal and breath sounds normal. No respiratory distress. Abdominal: Soft.  There is no tenderness. Psychiatric: Patient has a normal mood and affect. behavior is normal. Judgment and thought content normal.   Vitals:   08/28/23 0902  BP: 130/82  Pulse: 85  Resp: 16  SpO2: 99%  Weight: 240 lb 12.8 oz (109.2 kg)  Height: 6\' 3"  (1.905 m)    Body mass index is 30.1 kg/m.  Recent Results (from the past 2160 hours)  Testosterone      Status: None   Collection Time: 07/31/23  9:02 AM  Result Value Ref Range   Testosterone  434 264 - 916 ng/dL    Comment: Adult male reference interval is based on a population of healthy nonobese males (BMI <30) between 4 and 61 years old. Travison, et.al. JCEM [phone]. PMID: [id number].   PSA     Status: None   Collection Time: 07/31/23  9:02 AM  Result Value Ref Range   Prostate Specific Ag, Serum 2.8 0.0 - 4.0 ng/mL    Comment: Roche ECLIA methodology. According to the American Urological Association, Serum PSA should decrease and remain at undetectable levels after radical prostatectomy. The AUA defines biochemical recurrence as an initial PSA value 0.2 ng/mL or greater followed by a subsequent confirmatory PSA value 0.2 ng/mL or greater. Values obtained with different assay methods or kits cannot be used interchangeably. Results cannot be interpreted as absolute evidence of the presence or absence of malignant disease.   Hematocrit     Status: None   Collection Time: 07/31/23  9:02 AM  Result Value Ref Range   Hematocrit 47.9 37.5 - 51.0 %  POCT glycosylated hemoglobin (Hb A1C)     Status: None   Collection Time: 08/28/23  9:05 AM  Result Value Ref Range   Hemoglobin A1C 5.5 4.0 - 5.6 %   HbA1c POC  (<> result, manual entry)     HbA1c, POC (prediabetic range)     HbA1c, POC (controlled diabetic range)      Diabetic Foot Exam:   Diabetic Foot Exam - Simple   Simple Foot Form Visual Inspection No deformities, no  ulcerations, no other skin breakdown bilaterally: Yes Sensation Testing Intact to touch and monofilament testing bilaterally: Yes Pulse Check Posterior Tibialis and Dorsalis pulse intact bilaterally: Yes Comments      PHQ2/9:    08/28/2023    8:51 AM 02/24/2023    9:10 AM 11/06/2022   10:37 AM 10/15/2022   11:33 AM 08/25/2022    1:42 PM  Depression screen PHQ 2/9  Decreased Interest 0 0 0 0 0  Down, Depressed, Hopeless 0 0 0 0 0  PHQ - 2 Score 0 0 0 0 0  Altered sleeping  0 0 0 0  Tired, decreased energy  0 0 0 0  Change in appetite  0 0 0 0  Feeling bad or failure about yourself   0 0 0 0  Trouble concentrating  0 0 0 0  Moving slowly or fidgety/restless  0 0 0 0  Suicidal thoughts  0 0 0 0  PHQ-9 Score  0 0 0 0  Difficult doing work/chores  Not difficult at all  Not difficult at all     phq 9 is negative  Fall Risk:    08/28/2023    8:51 AM 02/24/2023    9:10 AM 11/06/2022   10:37 AM 10/15/2022   11:33 AM 08/25/2022    1:34 PM  Fall Risk   Falls in the past year? 0 0 0 0 0  Number falls in past yr: 0 0 0 0   Injury with Fall? 0 0 0 0   Risk for fall due to : No Fall Risks No Fall Risks No Fall Risks  No Fall Risks  Follow up Falls prevention discussed;Education provided;Falls evaluation completed Falls prevention discussed;Education provided;Falls evaluation completed Falls prevention discussed  Falls prevention discussed;Education provided;Falls evaluation completed      Assessment & Plan Piriformis Muscle Pain Acute left buttock pain likely due to piriformis muscle strain. Pain improving, no dislodgement signs. Muscle relaxer effective. - Advise use of ice for pain management. - Continue muscle relaxer as needed for pain. - Discuss with back doctor  during upcoming appointment.  Tinnitus Unilateral right ear tinnitus, possibly allergy-related. ENT evaluation needed to rule out other causes. - Refer to ENT for further evaluation. - Prescribe Flonase for allergy management.  Coronary Artery Disease Asymptomatic with two stents. Managed with aspirin , metoprolol , and Repatha . Lipid panel needed. - Continue current medication regimen. - Check lipid panel to assess cholesterol levels.  Hypertension Blood pressure 130/82 mmHg, within target range. Home monitoring advised for accurate data. - Advise regular home blood pressure monitoring. - Bring home blood pressure monitor to next cardiology visit for calibration.  Diabetes Mellitus with complications such as CAD/dyslipidemia/HTN Type 2 diabetes well-controlled with diet. A1c 5.5, no hyperglycemia symptoms. - Continue current dietary management. - Monitor A1c levels regularly.  Benign Prostatic Hyperplasia with Lower Urinary Tract Symptoms BPH with mild LUTS, stable symptoms under urologist care. - Continue dutasteride . - Monitor urinary symptoms.  Obstructive Sleep Apnea Well-managed with CPAP therapy, low AHI reported. - Continue CPAP therapy.  General Health Maintenance Routine health maintenance and chronic condition monitoring. Foot checks advised due to diabetes. - Perform CBC, comprehensive metabolic panel, lipid panel, and urinalysis. - Advise regular foot checks for signs of neuropathy or injury.

## 2023-08-29 ENCOUNTER — Encounter: Payer: Self-pay | Admitting: Urology

## 2023-08-29 LAB — CBC WITH DIFFERENTIAL/PLATELET
Absolute Lymphocytes: 1845 {cells}/uL (ref 850–3900)
Absolute Monocytes: 460 {cells}/uL (ref 200–950)
Basophils Absolute: 41 {cells}/uL (ref 0–200)
Basophils Relative: 0.9 %
Eosinophils Absolute: 179 {cells}/uL (ref 15–500)
Eosinophils Relative: 3.9 %
HCT: 46.9 % (ref 38.5–50.0)
Hemoglobin: 15.1 g/dL (ref 13.2–17.1)
MCH: 28.8 pg (ref 27.0–33.0)
MCHC: 32.2 g/dL (ref 32.0–36.0)
MCV: 89.3 fL (ref 80.0–100.0)
MPV: 10.5 fL (ref 7.5–12.5)
Monocytes Relative: 10 %
Neutro Abs: 2075 {cells}/uL (ref 1500–7800)
Neutrophils Relative %: 45.1 %
Platelets: 243 10*3/uL (ref 140–400)
RBC: 5.25 10*6/uL (ref 4.20–5.80)
RDW: 13.6 % (ref 11.0–15.0)
Total Lymphocyte: 40.1 %
WBC: 4.6 10*3/uL (ref 3.8–10.8)

## 2023-08-29 LAB — COMPREHENSIVE METABOLIC PANEL WITH GFR
AG Ratio: 1.6 (calc) (ref 1.0–2.5)
ALT: 20 U/L (ref 9–46)
AST: 20 U/L (ref 10–35)
Albumin: 4.6 g/dL (ref 3.6–5.1)
Alkaline phosphatase (APISO): 54 U/L (ref 35–144)
BUN: 12 mg/dL (ref 7–25)
CO2: 26 mmol/L (ref 20–32)
Calcium: 9.6 mg/dL (ref 8.6–10.3)
Chloride: 108 mmol/L (ref 98–110)
Creat: 0.98 mg/dL (ref 0.70–1.35)
Globulin: 2.8 g/dL (ref 1.9–3.7)
Glucose, Bld: 96 mg/dL (ref 65–99)
Potassium: 4 mmol/L (ref 3.5–5.3)
Sodium: 141 mmol/L (ref 135–146)
Total Bilirubin: 1.5 mg/dL — ABNORMAL HIGH (ref 0.2–1.2)
Total Protein: 7.4 g/dL (ref 6.1–8.1)
eGFR: 85 mL/min/{1.73_m2} (ref >=60)

## 2023-08-29 LAB — MICROALBUMIN / CREATININE URINE RATIO
Creatinine, Urine: 179 mg/dL (ref 20–320)
Microalb Creat Ratio: 16 mg/g{creat}
Microalb, Ur: 2.9 mg/dL

## 2023-08-29 LAB — LIPID PANEL
Cholesterol: 121 mg/dL (ref <200)
HDL: 40 mg/dL (ref >=40)
LDL Cholesterol (Calc): 61 mg/dL
Non-HDL Cholesterol (Calc): 81 mg/dL (ref <130)
Total CHOL/HDL Ratio: 3 (calc) (ref <5.0)
Triglycerides: 115 mg/dL (ref <150)

## 2023-08-31 ENCOUNTER — Other Ambulatory Visit (HOSPITAL_COMMUNITY): Payer: Self-pay

## 2023-08-31 ENCOUNTER — Telehealth: Payer: Self-pay | Admitting: Pharmacy Technician

## 2023-08-31 ENCOUNTER — Ambulatory Visit: Payer: Self-pay | Admitting: Family Medicine

## 2023-08-31 NOTE — Telephone Encounter (Signed)
 Pharmacy Patient Advocate Encounter  Received notification from rx advance that Prior Authorization for Repatha  has been APPROVED from 08/31/23 to 08/30/24. Ran test claim, Copay is $47.00- one month. This test claim was processed through [facility]- copay amounts may vary at other pharmacies due to pharmacy/plan contracts, or as the patient moves through the different stages of their insurance plan.   PA #/Case ID/Reference #: [id number]

## 2023-08-31 NOTE — Telephone Encounter (Signed)
 Pharmacy Patient Advocate Encounter   Received notification from CoverMyMeds that prior authorization for Repatha  is required/requested.   Insurance verification completed.   The patient is insured through rx advance .   Per test claim: PA required; PA submitted to above mentioned insurance via CoverMyMeds Key/confirmation #/EOC [id number] Status is pending

## 2023-09-13 ENCOUNTER — Encounter: Payer: Self-pay | Admitting: Family Medicine

## 2023-09-21 ENCOUNTER — Other Ambulatory Visit: Payer: Self-pay | Admitting: *Deleted

## 2023-09-28 LAB — HM DIABETES EYE EXAM

## 2023-09-28 MED ORDER — JATENZO 158 MG PO CAPS: 316.0000 mg | ORAL_CAPSULE | Freq: Two times a day (BID) | ORAL | 0 refills | Status: DC

## 2023-10-30 ENCOUNTER — Ambulatory Visit: Admitting: Nurse Practitioner

## 2023-11-12 ENCOUNTER — Other Ambulatory Visit: Payer: Self-pay | Admitting: Urology

## 2023-11-13 ENCOUNTER — Encounter: Payer: Self-pay | Admitting: Nurse Practitioner

## 2023-11-13 ENCOUNTER — Other Ambulatory Visit: Payer: Self-pay | Admitting: Nurse Practitioner

## 2023-11-13 ENCOUNTER — Ambulatory Visit: Attending: Nurse Practitioner | Admitting: Nurse Practitioner

## 2023-11-13 VITALS — BP 120/78 | HR 72 | Ht 75.0 in | Wt 237.8 lb

## 2023-11-13 DIAGNOSIS — G4733 Obstructive sleep apnea (adult) (pediatric): Secondary | ICD-10-CM | POA: Diagnosis not present

## 2023-11-13 DIAGNOSIS — E785 Hyperlipidemia, unspecified: Secondary | ICD-10-CM

## 2023-11-13 DIAGNOSIS — I10 Essential (primary) hypertension: Secondary | ICD-10-CM

## 2023-11-13 DIAGNOSIS — N529 Male erectile dysfunction, unspecified: Secondary | ICD-10-CM

## 2023-11-13 DIAGNOSIS — I2510 Atherosclerotic heart disease of native coronary artery without angina pectoris: Secondary | ICD-10-CM | POA: Diagnosis not present

## 2023-11-13 MED ORDER — DUTASTERIDE 0.5 MG PO CAPS: 0.5000 mg | ORAL_CAPSULE | Freq: Every day | ORAL | 3 refills | Status: AC

## 2023-11-13 MED ORDER — SILDENAFIL CITRATE 20 MG PO TABS: 20.0000 mg | ORAL_TABLET | ORAL | 3 refills | Status: DC | PRN

## 2023-11-13 NOTE — Patient Instructions (Signed)
 Medication Instructions:  Your physician recommends that you continue on your current medications as directed. Please refer to the Current Medication list given to you today.   *If you need a refill on your cardiac medications before your next appointment, please call your pharmacy*  Lab Work: None ordered at this time  If you have labs (blood work) drawn today and your tests are completely normal, you will receive your results only by: MyChart Message (if you have MyChart) OR A paper copy in the mail If you have any lab test that is abnormal or we need to change your treatment, we will call you to review the results.  Testing/Procedures: None ordered at this time   Follow-Up: At [facility], you and your health needs are our priority.  As part of our continuing mission to provide you with exceptional heart care, our providers are all part of one team.  This team includes your primary Cardiologist (physician) and Advanced Practice Providers or APPs (Physician Assistants and Nurse Practitioners) who all work together to provide you with the care you need, when you need it.  Your next appointment:   6 month(s)  Provider:   You may see Timothy Gollan, MD

## 2023-11-13 NOTE — Progress Notes (Signed)
 Office Visit    Patient Name: Dean Sullivan Date of Encounter: 11/13/2023  Primary Care Provider:  Sowles, Krichna, MD Primary Cardiologist:  Evalene Lunger, MD    Chief Complaint    68 y.o. male w/ a h/o CAD s/p prior LAD stenting, HTN, HL, DM, obesity, and OSA on CPAP, who presents for CAD follow-up.  Past Medical History   Subjective   Past Medical History:  Diagnosis Date   Allergy    CAD (coronary artery disease)    a. 04/2012 PCI: LAD 70-75 (3.5x22 Resolute DES), RCA 60-95p/m (2.5x12 Resolute DES). EF 60%; b. 09/2022 Cath: LM nl, LAD patent p/m stent, 20d, D1 nl, LCX large, 55p/m, RCA 25p/m ISR, RPDA 80. EF 50-55%-->Med rx.   Coronary arteriosclerosis in native artery    GERD (gastroesophageal reflux disease)    occ   Glaucoma    Hearing loss of right ear    patient was told he had slight hearing loss   History of echocardiogram    a. 09/2018 Echo: EF 55-60%, no rwma, nl RV fxn, mild BAE; b. 09/2022 Echo: EF 60-65%, no rwma, nl RV fxn, mild MR.   History of kidney stones    History of trigger finger    bilateral, but mostly in right thumb   Hyperlipidemia    Hypertension    Irregular heart beat    Sleep apnea    cpap   Type 2 diabetes mellitus (HCC) 03/18/2017   A1c 6.28 Jan 2017   Past Surgical History:  Procedure Laterality Date   CARDIAC CATHETERIZATION     x2 stents [facility]   CAROTID STENT INSERTION  2012   2   COLONOSCOPY WITH PROPOFOL  N/A 04/21/2017   Procedure: COLONOSCOPY WITH PROPOFOL ;  Surgeon: Therisa Bi, MD;  Location: [facility] ENDOSCOPY;  Service: Gastroenterology;  Laterality: N/A;   COLONOSCOPY WITH PROPOFOL  N/A 12/26/2022   Procedure: COLONOSCOPY WITH PROPOFOL ;  Surgeon: Therisa Bi, MD;  Location: [facility] ENDOSCOPY;  Service: Gastroenterology;  Laterality: N/A;   LEFT HEART CATH AND CORONARY ANGIOGRAPHY Left 09/23/2022   Procedure: LEFT HEART CATH AND CORONARY ANGIOGRAPHY;  Surgeon: Mady Bruckner, MD;  Location: [facility] INVASIVE CV LAB;   Service: Cardiovascular;  Laterality: Left;   LIPOMA EXCISION Left 03/09/2017   Procedure: EXCISION LIPOMA;  Surgeon: Cleotilde Barrio, MD;  Location: [facility] ORS;  Service: Orthopedics;  Laterality: Left;   PROSTATE BIOPSY  03/03/2018   RESECTION DISTAL CLAVICAL Left 03/09/2017   Procedure: RESECTION DISTAL CLAVICAL;  Surgeon: Cleotilde Barrio, MD;  Location: [facility] ORS;  Service: Orthopedics;  Laterality: Left;   SHOULDER ARTHROSCOPY WITH OPEN ROTATOR CUFF REPAIR Left 03/09/2017   Procedure: SHOULDER ARTHROSCOPY WITH OPEN ROTATOR CUFF REPAIR;  Surgeon: Cleotilde Barrio, MD;  Location: [facility] ORS;  Service: Orthopedics;  Laterality: Left;   SUBACROMIAL DECOMPRESSION Left 03/09/2017   Procedure: SUBACROMIAL DECOMPRESSION;  Surgeon: Cleotilde Barrio, MD;  Location: [facility] ORS;  Service: Orthopedics;  Laterality: Left;  BICEPS TENOTOMY    Allergies  No Known Allergies     History of Present Illness      68 y.o. y/o male with a past medical history including CAD, hypertension, hyperlipidemia, diabetes, obesity, and sleep apnea on CPAP.  He previously underwent PCI and stenting of the LAD and right coronary artery in February 2014.  In the setting of c/p, he underwent stress testing in 2020 which did not show any significant ischemia or scar but showed an EF of 30 to 44% with significant extracardiac activity.  Event monitoring  in June 2020 showed predominantly sinus rhythm with 2 brief episodes of SVT, occasional PACs and PVCs.  Echocardiogram in July 2020 showed normal LV function, and thus EF on stress testing was felt to be falsely low, and he was medically managed.  He was seen in June 2024 due to intermittent, sharp left-sided chest pain prompting urgent care evaluation, with normal workup.  At cardiology follow-up, he reported unstable anginal symptoms and decision was made to pursue diagnostic catheterization, which was performed in early July 2024, showing patent stents in the LAD and RCA (minimal in-stent  restenosis), and an 80% stenosis in the RPDA.  The RPDA was too small/distal for PCI medical therapy was recommended.  Follow-up echo showed EF 60 to 65% with no regional wall motion abnormalities, and mild MR.  In mid-2024, he required d/c of statin rx due to CK of 618 and he was referred to rheumatology.       Mr. [name] was last seen in cardiology clinic in 03/2023, at which time he was doing well.  We increased beta-blocker therapy in the setting of hypertension with improved control upon MyChart submission of home pressures 2 weeks later.  Over the past 6 months, he has done well from a cardiac standpoint.  He continues to [employer] fairly regularly but usually has to skip every other song due to ongoing back pain.  He is followed by Ortho and had an injection recently.  In addition to low back pain, he has been having some right hip discomfort.  He controls his pain with Tylenol  and occasional tizanidine.  He denies chest pain, dyspnea, palpitations, PND, orthopnea, dizziness, syncope, edema, or early satiety.  He has been evaluated for sinus congestion and has been using Flonase . Objective   Home Medications    Current Outpatient Medications  Medication Sig Dispense Refill   aspirin  81 MG tablet Take 81 mg by mouth daily.     cetirizine (ZYRTEC) 10 MG tablet Take 10 mg by mouth daily.     Cholecalciferol (VITAMIN D ) 125 MCG (5000 UT) CAPS Take 5,000 mcg by mouth daily.     cyanocobalamin  (VITAMIN B12) 1000 MCG tablet Take 1,000 mcg by mouth daily.     dorzolamide (TRUSOPT) 2 % ophthalmic solution 1 drop 3 (three) times daily.     dutasteride  (AVODART ) 0.5 MG capsule Take 1 capsule by mouth once daily 90 capsule 2   Evolocumab  (REPATHA  SURECLICK) 140 MG/ML SOAJ Inject 140 mg into the skin every 14 (fourteen) days. 2 mL 11   fluticasone  (FLONASE ) 50 MCG/ACT nasal spray Place 2 sprays into both nostrils daily. 48 mL 0   latanoprost (XALATAN) 0.005 % ophthalmic solution Place 1 drop into both  eyes at bedtime.      losartan  (COZAAR ) 100 MG tablet Take 1 tablet by mouth once daily 90 tablet 3   metoprolol  succinate (TOPROL -XL) 50 MG 24 hr tablet Take 1 tablet (50 mg total) by mouth daily. 90 tablet 3   nitroGLYCERIN  (NITROSTAT ) 0.4 MG SL tablet Place 1 tablet (0.4 mg total) under the tongue every 5 (five) minutes as needed for chest pain. 25 tablet PRN   sildenafil  (REVATIO ) 20 MG tablet Take 1 tablet (20 mg total) by mouth as needed. 90 tablet 0   Testosterone  Undecanoate (JATENZO ) 158 MG CAPS Take 2 capsules (316 mg total) by mouth in the morning and at bedtime. 360 capsule 0   tiZANidine (ZANAFLEX) 2 MG tablet Take between 1mg  (half a tablet) and 4mg  (two  tablets) up to three times daily as needed for MSK pain.     VITAMIN E PO Take by mouth daily.     No current facility-administered medications for this visit.     Physical Exam    VS:  BP 120/78 (BP Location: Left Arm, Patient Position: Sitting, Cuff Size: Normal)   Pulse 72   Ht 6' 3 (1.905 m)   Wt 273 lb 12.8 oz (124.2 kg)   SpO2 99%   BMI 34.22 kg/m  , BMI Body mass index is 34.22 kg/m.          GEN: Well nourished, well developed, in no acute distress. HEENT: normal. Neck: Supple, no JVD, carotid bruits, or masses. Cardiac: RRR, no murmurs, rubs, or gallops. No clubbing, cyanosis, edema.  Radials 2+/PT 2+ and equal bilaterally.  Respiratory:  Respirations regular and unlabored, clear to auscultation bilaterally. GI: Soft, nontender, nondistended, BS + x 4. MS: no deformity or atrophy. Skin: warm and dry, no rash. Neuro:  Strength and sensation are intact. Psych: Normal affect.  Accessory Clinical Findings    ECG personally reviewed by me today - EKG Interpretation Date/Time:  Friday November 13 2023 08:39:39 EDT Ventricular Rate:  72 PR Interval:  188 QRS Duration:  90 QT Interval:  392 QTC Calculation: 429 R Axis:   37  Text Interpretation: Sinus rhythm with Artifact Nonspecific T wave abnormality  Confirmed by Vivienne Bruckner [phone]) on 11/13/2023 8:53:06 AM   - no acute changes.  Lab Results  Component Value Date   WBC 4.6 08/28/2023   HGB 15.1 08/28/2023   HCT 46.9 08/28/2023   MCV 89.3 08/28/2023   PLT 243 08/28/2023   Lab Results  Component Value Date   CREATININE 0.98 08/28/2023   BUN 12 08/28/2023   NA 141 08/28/2023   K 4.0 08/28/2023   CL 108 08/28/2023   CO2 26 08/28/2023   Lab Results  Component Value Date   ALT 20 08/28/2023   AST 20 08/28/2023   ALKPHOS 51 09/17/2022   BILITOT 1.5 (H) 08/28/2023   Lab Results  Component Value Date   CHOL 121 08/28/2023   HDL 40 08/28/2023   LDLCALC 61 08/28/2023   TRIG 115 08/28/2023   CHOLHDL 3.0 08/28/2023    Lab Results  Component Value Date   HGBA1C 5.5 08/28/2023   Lab Results  Component Value Date   TSH 1.343 09/10/2018       Assessment & Plan    1.  Coronary artery disease: Status post prior LAD and RCA stenting in 2014, with catheterization in July 2024 showing patent stents and 85% distal stenosis in the PDA, which was felt to be too small and too distal upon which to intervene.  He remains relatively active, though was limited by ongoing low back pain.  He does not experience chest pain or dyspnea.  He remains on aspirin , beta-blocker, ARB, and Repatha  therapy.  Recent lipids at goal.  No additional testing required today.  2.  Primary hypertension: Blood pressure stable at 120/78.  He remains on Toprol -XL and losartan .  3.  Hyperlipidemia: Statin intolerant but on Repatha  with an LDL of 61 in June of this year.  4.  Obstructive sleep apnea: Compliant with CPAP.  5.  Erectile dysfxn:  requires refill on sildenafil .  Discussed inability to use nitrates in setting of recent sildenafil  use - pt verbalized understanding.  6.  Chronic low back pain: Followed by Ortho with recent injection.  He is concerned  that he is likely to require lumbar surgery in the future.  He is managing pain with Tylenol  and  as needed tizanidine.  7.  Disposition: Follow-up in 6 months or sooner if necessary.  Lonni Meager, NP 11/13/2023, 8:53 AM

## 2023-12-11 DIAGNOSIS — M5416 Radiculopathy, lumbar region: Secondary | ICD-10-CM | POA: Diagnosis not present

## 2023-12-17 ENCOUNTER — Telehealth: Payer: Self-pay | Admitting: Nurse Practitioner

## 2023-12-17 DIAGNOSIS — G4733 Obstructive sleep apnea (adult) (pediatric): Secondary | ICD-10-CM | POA: Diagnosis not present

## 2023-12-17 NOTE — Telephone Encounter (Signed)
   Pt called b/c he has been having some discomfort in his shoulder and across his chest that is worse with palpation, position changes, and deep breathing, ever since cutting down some trees and moving the limbs the other day.  He has not had any dyspnea or limitations in activities.  Pain is different than prior angina.  This evening, he also noticed a mild tingling sensation across his right cheek.  This has been persistent.  He has not experienced any unilateral weakness, numbness, facial droop, or change in vision.  His speech is normal and he notes he is otherwise feeling well.  I strongly encouraged him to keep a close eye on the symptoms and if tingling progresses to anything described above, he should seek immediate evaluation.  He is currently in [place], [place] , and will consider presenting to urgent care for physical exam and lab work.  Lonni Meager, NP 12/17/2023, 6:34 PM

## 2023-12-25 DIAGNOSIS — M5416 Radiculopathy, lumbar region: Secondary | ICD-10-CM | POA: Diagnosis not present

## 2023-12-28 ENCOUNTER — Other Ambulatory Visit: Payer: Self-pay | Admitting: *Deleted

## 2023-12-28 ENCOUNTER — Telehealth: Payer: Self-pay | Admitting: Urology

## 2023-12-28 NOTE — Telephone Encounter (Signed)
 Patient called to request refill for testosterone  be sent to [facility]. He said he spoke with them and they told him they have sent several requests to us  with no reply. He said he has 3 days left and needs refill as soon as possible.

## 2023-12-28 NOTE — Telephone Encounter (Signed)
 Sent to Dr. Lonna Cobb to sign off on .

## 2023-12-29 MED ORDER — JATENZO 158 MG PO CAPS: 316.0000 mg | ORAL_CAPSULE | Freq: Two times a day (BID) | ORAL | 0 refills | Status: DC

## 2024-01-11 ENCOUNTER — Encounter: Payer: Self-pay | Admitting: Family Medicine

## 2024-01-28 ENCOUNTER — Other Ambulatory Visit: Payer: Self-pay

## 2024-01-28 DIAGNOSIS — N401 Enlarged prostate with lower urinary tract symptoms: Secondary | ICD-10-CM

## 2024-01-29 DIAGNOSIS — H401131 Primary open-angle glaucoma, bilateral, mild stage: Secondary | ICD-10-CM | POA: Diagnosis not present

## 2024-01-29 DIAGNOSIS — H2513 Age-related nuclear cataract, bilateral: Secondary | ICD-10-CM | POA: Diagnosis not present

## 2024-02-01 ENCOUNTER — Other Ambulatory Visit

## 2024-02-01 DIAGNOSIS — N401 Enlarged prostate with lower urinary tract symptoms: Secondary | ICD-10-CM | POA: Diagnosis not present

## 2024-02-02 LAB — TESTOSTERONE: Testosterone: 366 ng/dL (ref 264–916)

## 2024-02-02 LAB — HEMOGLOBIN AND HEMATOCRIT, BLOOD
Hematocrit: 51.1 % — ABNORMAL HIGH (ref 37.5–51.0)
Hemoglobin: 16.3 g/dL (ref 13.0–17.7)

## 2024-02-05 ENCOUNTER — Other Ambulatory Visit

## 2024-02-15 DIAGNOSIS — R748 Abnormal levels of other serum enzymes: Secondary | ICD-10-CM | POA: Diagnosis not present

## 2024-02-22 ENCOUNTER — Ambulatory Visit

## 2024-02-22 VITALS — Ht 75.0 in | Wt 242.0 lb

## 2024-02-22 DIAGNOSIS — Z0000 Encounter for general adult medical examination without abnormal findings: Secondary | ICD-10-CM

## 2024-02-22 NOTE — Progress Notes (Signed)
 Chief Complaint  Patient presents with   Medicare Wellness     Subjective:   Dean Sullivan is a 68 y.o. male who presents for a Medicare Annual Wellness Visit.  Allergies (verified) Patient has no known allergies.   History: Past Medical History:  Diagnosis Date   Allergy    CAD (coronary artery disease)    a. 04/2012 PCI: LAD 70-75 (3.5x22 Resolute DES), RCA 60-95p/m (2.5x12 Resolute DES). EF 60%; b. 09/2022 Cath: LM nl, LAD patent p/m stent, 20d, D1 nl, LCX large, 55p/m, RCA 25p/m ISR, RPDA 80. EF 50-55%-->Med rx.   Coronary arteriosclerosis in native artery    GERD (gastroesophageal reflux disease)    occ   Glaucoma    Hearing loss of right ear    patient was told he had slight hearing loss   History of echocardiogram    a. 09/2018 Echo: EF 55-60%, no rwma, nl RV fxn, mild BAE; b. 09/2022 Echo: EF 60-65%, no rwma, nl RV fxn, mild MR.   History of kidney stones    History of trigger finger    bilateral, but mostly in right thumb   Hyperlipidemia    Hypertension    Irregular heart beat    Sleep apnea    cpap   Type 2 diabetes mellitus (HCC) 03/18/2017   A1c 6.28 Jan 2017   Past Surgical History:  Procedure Laterality Date   CARDIAC CATHETERIZATION     x2 stents [facility]   CAROTID STENT INSERTION  2012   2   COLONOSCOPY WITH PROPOFOL  N/A 04/21/2017   Procedure: COLONOSCOPY WITH PROPOFOL ;  Surgeon: Therisa Bi, MD;  Location: [facility] ENDOSCOPY;  Service: Gastroenterology;  Laterality: N/A;   COLONOSCOPY WITH PROPOFOL  N/A 12/26/2022   Procedure: COLONOSCOPY WITH PROPOFOL ;  Surgeon: Therisa Bi, MD;  Location: [facility] ENDOSCOPY;  Service: Gastroenterology;  Laterality: N/A;   LEFT HEART CATH AND CORONARY ANGIOGRAPHY Left 09/23/2022   Procedure: LEFT HEART CATH AND CORONARY ANGIOGRAPHY;  Surgeon: Mady Bruckner, MD;  Location: [facility] INVASIVE CV LAB;  Service: Cardiovascular;  Laterality: Left;   LIPOMA EXCISION Left 03/09/2017   Procedure: EXCISION LIPOMA;  Surgeon:  Cleotilde Barrio, MD;  Location: [facility] ORS;  Service: Orthopedics;  Laterality: Left;   PROSTATE BIOPSY  03/03/2018   RESECTION DISTAL CLAVICAL Left 03/09/2017   Procedure: RESECTION DISTAL CLAVICAL;  Surgeon: Cleotilde Barrio, MD;  Location: [facility] ORS;  Service: Orthopedics;  Laterality: Left;   SHOULDER ARTHROSCOPY WITH OPEN ROTATOR CUFF REPAIR Left 03/09/2017   Procedure: SHOULDER ARTHROSCOPY WITH OPEN ROTATOR CUFF REPAIR;  Surgeon: Cleotilde Barrio, MD;  Location: [facility] ORS;  Service: Orthopedics;  Laterality: Left;   SUBACROMIAL DECOMPRESSION Left 03/09/2017   Procedure: SUBACROMIAL DECOMPRESSION;  Surgeon: Cleotilde Barrio, MD;  Location: [facility] ORS;  Service: Orthopedics;  Laterality: Left;  BICEPS TENOTOMY   Family History  Problem Relation Age of Onset   Diabetes Mother        adult onset - geriatric   Alcohol abuse Father    Cancer Father        prostate   Stroke Father        mini strokes   Heart Problems Father    Other Maternal Aunt        born with her lung outside of her body; lived until 47.   Arthritis Maternal Grandmother    Social History   Occupational History   Occupation: Retired  Tobacco Use   Smoking status: Never   Smokeless tobacco: Never  [employer]  Vaping status: Never Used  Substance and Sexual Activity   Alcohol use: Yes    Alcohol/week: 0.0 standard drinks of alcohol    Comment: a glass of wine/beer   Drug use: No   Sexual activity: Not Currently    Partners: Female   Tobacco Counseling Counseling given: Not Answered  SDOH Screenings   Food Insecurity: No Food Insecurity (02/22/2024)  Housing: Unknown (02/22/2024)  Transportation Needs: No Transportation Needs (02/22/2024)  Utilities: Not At Risk (02/22/2024)  Alcohol Screen: Low Risk  (10/15/2022)  Depression (PHQ2-9): Low Risk  (02/22/2024)  Financial Resource Strain: Low Risk  (10/15/2022)  Physical Activity: Insufficiently Active (02/22/2024)  Social Connections: Socially Isolated (02/22/2024)   Stress: No Stress Concern Present (02/22/2024)  Tobacco Use: Low Risk  (02/22/2024)  Health Literacy: Adequate Health Literacy (02/22/2024)   See flowsheets for full screening details  Depression Screen PHQ 2 & 9 Depression Scale- Over the past 2 weeks, how often have you been bothered by any of the following problems? Little interest or pleasure in doing things: 0 Feeling down, depressed, or hopeless (PHQ Adolescent also includes...irritable): 0 PHQ-2 Total Score: 0 Trouble falling or staying asleep, or sleeping too much: 0 Feeling tired or having little energy: 0 Poor appetite or overeating (PHQ Adolescent also includes...weight loss): 0 Feeling bad about yourself - or that you are a failure or have let yourself or your family down: 0 Trouble concentrating on things, such as reading the newspaper or watching television (PHQ Adolescent also includes...like school work): 0 Moving or speaking so slowly that other people could have noticed. Or the opposite - being so fidgety or restless that you have been moving around a lot more than usual: 0 Thoughts that you would be better off dead, or of hurting yourself in some way: 0 PHQ-9 Total Score: 0 If you checked off any problems, how difficult have these problems made it for you to do your work, take care of things at home, or get along with other people?: Not difficult at all  Depression Treatment Depression Interventions/Treatment : EYV7-0 Score <4 Follow-up Not Indicated     Goals Addressed             This Visit's Progress    Patient Stated       Not at this time/2025       Visit info / Clinical Intake: Medicare Wellness Visit Type:: Subsequent Annual Wellness Visit Persons participating in visit:: patient Medicare Wellness Visit Mode:: Video Because this visit was a virtual/telehealth visit:: unable to obtan vitals due to lack of equipment If Telephone or Video please confirm:: I connected with the patient using audio/video  enabled telemedicine application and verified that I am speaking with the correct person using two identifiers; I discussed the limitations of evaluation and management by telemedicine; The patient expressed understanding and agreed to proceed Patient Location:: Mom's house Provider Location:: Home Information given by:: patient Interpreter Needed?: No Pre-visit prep was completed: no AWV questionnaire completed by patient prior to visit?: no Living arrangements:: (!) lives alone Patient's Overall Health Status Rating: good Typical amount of pain: (!) a lot Does pain affect daily life?: (!) yes (walking sitting standing) Are you currently prescribed opioids?: no  Dietary Habits and Nutritional Risks How many meals a day?: 2 Eats fruit and vegetables daily?: yes Most meals are obtained by: preparing own meals In the last 2 weeks, have you had any of the following?: none Diabetic:: (!) yes Any non-healing wounds?: no How often do  you check your BS?: 0 Would you like to be referred to a Nutritionist or for Diabetic Management? : no  Functional Status Activities of Daily Living (to include ambulation/medication): Independent Ambulation: Independent with device- listed below Home Assistive Devices/Equipment: Eyeglasses; CPAP Medication Administration: Independent Home Management: Independent Manage your own finances?: yes Primary transportation is: driving Concerns about vision?: no *vision screening is required for WTM* (pressure is up in eye) Concerns about hearing?: no  Fall Screening Falls in the past year?: 0 Number of falls in past year: 0 Was there an injury with Fall?: 0 Fall Risk Category Calculator: 0 Patient Fall Risk Level: Low Fall Risk  Fall Risk Patient at Risk for Falls Due to: No Fall Risks Fall risk Follow up: Falls evaluation completed; Falls prevention discussed  Home and Transportation Safety: All rugs have non-skid backing?: N/A, no rugs All stairs or  steps have railings?: N/A, no stairs Grab bars in the bathtub or shower?: (!) no Have non-skid surface in bathtub or shower?: yes Good home lighting?: yes Regular seat belt use?: yes Hospital stays in the last year:: no  Cognitive Assessment Difficulty concentrating, remembering, or making decisions? : no Will 6CIT or Mini Cog be Completed: no 6CIT or Mini Cog Declined: patient alert, oriented, able to answer questions appropriately and recall recent events  Advance Directives (For Healthcare) Does Patient Have a Medical Advance Directive?: No  Reviewed/Updated  Reviewed/Updated: Reviewed All (Medical, Surgical, Family, Medications, Allergies, Care Teams, Patient Goals)        Objective:    Today's Vitals   02/22/24 1009  Weight: 242 lb (109.8 kg)  Height: 6' 3 (1.905 m)   Body mass index is 30.25 kg/m.  Current Medications (verified) Outpatient Encounter Medications as of 02/22/2024  Medication Sig   acetaminophen  (TYLENOL ) 500 MG tablet Take 500 mg by mouth daily as needed for moderate pain (pain score 4-6).   aspirin  81 MG tablet Take 81 mg by mouth daily.   brimonidine-timolol (COMBIGAN) 0.2-0.5 % ophthalmic solution Apply 1 drop to eye.   cetirizine (ZYRTEC) 10 MG tablet Take 10 mg by mouth daily.   Cholecalciferol (VITAMIN D ) 125 MCG (5000 UT) CAPS Take 5,000 mcg by mouth daily.   cyanocobalamin  (VITAMIN B12) 1000 MCG tablet Take 1,000 mcg by mouth daily.   dorzolamide (TRUSOPT) 2 % ophthalmic solution 1 drop 3 (three) times daily.   dutasteride  (AVODART ) 0.5 MG capsule Take 1 capsule (0.5 mg total) by mouth daily.   Evolocumab  (REPATHA  SURECLICK) 140 MG/ML SOAJ Inject 140 mg into the skin every 14 (fourteen) days.   fluticasone  (FLONASE ) 50 MCG/ACT nasal spray Place 2 sprays into both nostrils daily.   latanoprost (XALATAN) 0.005 % ophthalmic solution Place 1 drop into both eyes at bedtime.    losartan  (COZAAR ) 100 MG tablet Take 1 tablet by mouth once daily    metoprolol  succinate (TOPROL -XL) 50 MG 24 hr tablet Take 1 tablet (50 mg total) by mouth daily.   nitroGLYCERIN  (NITROSTAT ) 0.4 MG SL tablet Place 1 tablet (0.4 mg total) under the tongue every 5 (five) minutes as needed for chest pain.   potassium chloride (KLOR-CON M) 10 MEQ tablet Take 10 mEq by mouth.   sildenafil  (REVATIO ) 20 MG tablet Take 1 tablet (20 mg total) by mouth daily as needed.   Testosterone  Undecanoate (JATENZO ) 158 MG CAPS Take 2 capsules (316 mg total) by mouth in the morning and at bedtime.   VITAMIN E PO Take by mouth daily.   tiZANidine (ZANAFLEX)  2 MG tablet Take between 1mg  (half a tablet) and 4mg  (two tablets) up to three times daily as needed for MSK pain.   No facility-administered encounter medications on file as of 02/22/2024.   Hearing/Vision screen Hearing Screening - Comments:: Denies hearing difficulties   Vision Screening - Comments:: Wears eyeglasses/[facility] Immunizations and Health Maintenance Health Maintenance  Topic Date Due   Zoster Vaccines- Shingrix (1 of 2) Never done   COVID-19 Vaccine (7 - 2025-26 season) 11/23/2023   FOOT EXAM  02/24/2024   HEMOGLOBIN A1C  02/27/2024   Diabetic kidney evaluation - eGFR measurement  08/27/2024   Diabetic kidney evaluation - Urine ACR  08/27/2024   OPHTHALMOLOGY EXAM  01/28/2025   Medicare Annual Wellness (AWV)  02/21/2025   DTaP/Tdap/Td (2 - Td or Tdap) 07/14/2027   Colonoscopy  12/25/2032   Pneumococcal Vaccine: 50+ Years  Completed   Hepatitis C Screening  Completed   Meningococcal B Vaccine  Aged Out   Influenza Vaccine  Discontinued        Assessment/Plan:  This is a routine wellness examination for [place].  Patient Care Team: Sowles, Krichna, MD as PCP - General (Family Medicine) Perla Evalene PARAS, MD as PCP - Cardiology (Cardiology) Perla Evalene PARAS, MD as Consulting Physician (Cardiology) Lanna Glean Agent, MD as Referring Physician  (Ophthalmology) Twylla Glendia BROCKS, MD (Urology) [facility]  I have personally reviewed and noted the following in the patient's chart:   Medical and social history Use of alcohol, tobacco or illicit drugs  Current medications and supplements including opioid prescriptions. Functional ability and status Nutritional status Physical activity Advanced directives List of other physicians Hospitalizations, surgeries, and ER visits in previous 12 months Vitals Screenings to include cognitive, depression, and falls Referrals and appointments  No orders of the defined types were placed in this encounter.  In addition, I have reviewed and discussed with patient certain preventive protocols, quality metrics, and best practice recommendations. A written personalized care plan for preventive services as well as general preventive health recommendations were provided to patient.   Glenard Keesling L Darleen Moffitt, CMA   02/22/2024   Return in 1 year (on 02/21/2025).  After Visit Summary: (MyChart) Due to this being a telephonic visit, the after visit summary with patients personalized plan was offered to patient via MyChart   Nurse Notes: Patient is due for a Shingrix vaccine.  Patient is up to date on all other health maintenance with no concerns to address today.

## 2024-02-22 NOTE — Patient Instructions (Signed)
 [name],  Thank you for taking the time for your Medicare Wellness Visit. I appreciate your continued commitment to your health goals. Please review the care plan we discussed, and feel free to reach out if I can assist you further.  Please note that Annual Wellness Visits do not include a physical exam. Some assessments may be limited, especially if the visit was conducted virtually. If needed, we may recommend an in-person follow-up with your provider.  Ongoing Care Seeing your primary care provider every 3 to 6 months helps us  monitor your health and provide consistent, personalized care. Next office visit on 02/26/2024.  You are due for a Shingles vaccine and can get that done at your local pharmacy.  Each day, aim for 6 glasses of water, plenty of protein in your diet and try to get up and walk/ stretch every hour for 5-10 minutes at a time.    Referrals If a referral was made during today's visit and you haven't received any updates within two weeks, please contact the referred provider directly to check on the status.  Recommended Screenings:  Health Maintenance  Topic Date Due   Zoster (Shingles) Vaccine (1 of 2) Never done   Medicare Annual Wellness Visit  11/06/2023   COVID-19 Vaccine (7 - 2025-26 season) 11/23/2023   Complete foot exam   02/24/2024   Hemoglobin A1C  02/27/2024   Yearly kidney function blood test for diabetes  08/27/2024   Yearly kidney health urinalysis for diabetes  08/27/2024   Eye exam for diabetics  01/28/2025   DTaP/Tdap/Td vaccine (2 - Td or Tdap) 07/14/2027   Colon Cancer Screening  12/25/2032   Pneumococcal Vaccine for age over 56  Completed   Hepatitis C Screening  Completed   Meningitis B Vaccine  Aged Out   Flu Shot  Discontinued       02/22/2024   10:19 AM  Advanced Directives  Does Patient Have a Medical Advance Directive? No    Vision: Annual vision screenings are recommended for early detection of glaucoma, cataracts, and diabetic  retinopathy. These exams can also reveal signs of chronic conditions such as diabetes and high blood pressure.  Dental: Annual dental screenings help detect early signs of oral cancer, gum disease, and other conditions linked to overall health, including heart disease and diabetes.  Please see the attached documents for additional preventive care recommendations.

## 2024-02-26 ENCOUNTER — Encounter: Payer: Self-pay | Admitting: Family Medicine

## 2024-02-26 ENCOUNTER — Ambulatory Visit: Admitting: Family Medicine

## 2024-02-26 VITALS — BP 124/80 | HR 66 | Resp 16 | Ht 75.0 in | Wt 242.3 lb

## 2024-02-26 DIAGNOSIS — I700 Atherosclerosis of aorta: Secondary | ICD-10-CM

## 2024-02-26 DIAGNOSIS — E1169 Type 2 diabetes mellitus with other specified complication: Secondary | ICD-10-CM | POA: Diagnosis not present

## 2024-02-26 DIAGNOSIS — G4733 Obstructive sleep apnea (adult) (pediatric): Secondary | ICD-10-CM | POA: Diagnosis not present

## 2024-02-26 DIAGNOSIS — I2510 Atherosclerotic heart disease of native coronary artery without angina pectoris: Secondary | ICD-10-CM | POA: Diagnosis not present

## 2024-02-26 DIAGNOSIS — H409 Unspecified glaucoma: Secondary | ICD-10-CM | POA: Insufficient documentation

## 2024-02-26 DIAGNOSIS — E785 Hyperlipidemia, unspecified: Secondary | ICD-10-CM | POA: Diagnosis not present

## 2024-02-26 DIAGNOSIS — M48062 Spinal stenosis, lumbar region with neurogenic claudication: Secondary | ICD-10-CM | POA: Diagnosis not present

## 2024-02-26 DIAGNOSIS — E119 Type 2 diabetes mellitus without complications: Secondary | ICD-10-CM

## 2024-02-26 DIAGNOSIS — I10 Essential (primary) hypertension: Secondary | ICD-10-CM

## 2024-02-26 DIAGNOSIS — N401 Enlarged prostate with lower urinary tract symptoms: Secondary | ICD-10-CM | POA: Insufficient documentation

## 2024-02-26 DIAGNOSIS — R972 Elevated prostate specific antigen [PSA]: Secondary | ICD-10-CM | POA: Diagnosis not present

## 2024-02-26 LAB — POCT GLYCOSYLATED HEMOGLOBIN (HGB A1C): Hemoglobin A1C: 5.9 % — AB (ref 4.0–5.6)

## 2024-02-26 NOTE — Progress Notes (Signed)
 Name: Dean Sullivan   MRN: [id number]    DOB: 1955-12-21   Date:02/26/2024       Progress Note  Subjective  Chief Complaint  Chief Complaint  Patient presents with   Medical Management of Chronic Issues   Discussed the use of AI scribe software for clinical note transcription with the patient, who gave verbal consent to proceed.  History of Present Illness Dean Sullivan is a 68 year old male with type 2 diabetes, dyslipidemia, and coronary heart disease who presents for a six-month follow-up visit.  He has type 2 diabetes diagnosed in 2018, initially with an A1c of 6.7%, now well-controlled at 5.9% through lifestyle modifications. He eats twice a day with home-cooked meals, reduces starch intake, and occasionally consumes cookies and potato chips. He remains physically active and takes care of his mother.  He experiences ongoing sciatica, worsening over the past seven to eight months, with constant pain radiating from his hip down his right leg, accompanied by muscle contractions and pulling sensations. Previous treatments include gabapentin , which caused cramps, and tizanidine, which was ineffective. He currently manages pain with Tylenol  and ibuprofen, taken alternately, and has had limited relief from steroid injections.  He has glaucoma, managed with multiple eye drops including latanoprost and dorzolamide. His intraocular pressure has been managed with laser treatments in the past, but he now requires a third eye drop. His family history includes glaucoma on both sides.  He has coronary heart disease and is on Repatha , losartan , and metoprolol . No current angina or chest tightness. He also takes sildenafil  as needed for erectile dysfunction and potassium supplements to prevent cramps.  He has a history of lumbar spinal stenosis and requires the use of a cart when shopping due to pain. He has had previous left-sided sciatica and now experiences right-sided symptoms.  He has obstructive  sleep apnea and uses a CPAP machine regularly, with low apnea-hypopnea index (AHI) numbers. He also has an enlarged prostate and takes Avodart , but has not been approved for surgical intervention due to insufficient symptoms.  He takes a daily men's vitamin and a baby aspirin  for coronary health. His blood pressure is well-controlled with losartan , and he has a history of high blood pressure associated with diabetes.    Patient Active Problem List   Diagnosis Date Noted   BPH with elevated PSA and lower urinary tract symptoms 02/26/2024   Atherosclerosis of abdominal aorta 08/28/2023   Tinnitus, right ear 08/28/2023   History of colonic polyps 12/26/2022   Unstable angina (HCC) 09/19/2022   Spinal stenosis of lumbar region 09/03/2022   Neurogenic claudication due to lumbar spinal stenosis 08/25/2022   Lumbar spondylosis 01/23/2020   Trigger finger of right hand 05/14/2018   CAD (coronary artery disease), native coronary artery 09/07/2017   Dyslipidemia associated with type 2 diabetes mellitus (HCC) 03/18/2017   Colon cancer screening 02/19/2017   Tear of left rotator cuff 01/26/2017   Complete tear of left rotator cuff 01/12/2017   Injury of tendon of long head of left biceps 01/12/2017   Rotator cuff tendinitis, left 01/12/2017   Acquired trigger finger 12/31/2016   Ganglion cyst 09/11/2016   Impingement syndrome of left shoulder region 09/11/2016   Radial styloid tenosynovitis 09/11/2016   Screening for prostate cancer 09/10/2016   Medication monitoring encounter 03/12/2016   Trigger finger of right hand 02/20/2015   Hyperlipidemia LDL goal <70 01/05/2015   Status post coronary artery stent placement 01/05/2015   Hypertension goal BP (blood pressure) < 130/80  01/05/2015   Obesity 01/05/2015   Right foot pain 01/05/2015   Obstructive sleep apnea on CPAP 01/05/2015   Presence of coronary angioplasty implant and graft 05/05/2012    Past Surgical History:  Procedure Laterality  Date   CARDIAC CATHETERIZATION     x2 stents [facility]   CAROTID STENT INSERTION  2012   2   COLONOSCOPY WITH PROPOFOL  N/A 04/21/2017   Procedure: COLONOSCOPY WITH PROPOFOL ;  Surgeon: Therisa Bi, MD;  Location: [facility] ENDOSCOPY;  Service: Gastroenterology;  Laterality: N/A;   COLONOSCOPY WITH PROPOFOL  N/A 12/26/2022   Procedure: COLONOSCOPY WITH PROPOFOL ;  Surgeon: Therisa Bi, MD;  Location: [facility] ENDOSCOPY;  Service: Gastroenterology;  Laterality: N/A;   LEFT HEART CATH AND CORONARY ANGIOGRAPHY Left 09/23/2022   Procedure: LEFT HEART CATH AND CORONARY ANGIOGRAPHY;  Surgeon: Mady Bruckner, MD;  Location: [facility] INVASIVE CV LAB;  Service: Cardiovascular;  Laterality: Left;   LIPOMA EXCISION Left 03/09/2017   Procedure: EXCISION LIPOMA;  Surgeon: Cleotilde Barrio, MD;  Location: [facility] ORS;  Service: Orthopedics;  Laterality: Left;   PROSTATE BIOPSY  03/03/2018   RESECTION DISTAL CLAVICAL Left 03/09/2017   Procedure: RESECTION DISTAL CLAVICAL;  Surgeon: Cleotilde Barrio, MD;  Location: [facility] ORS;  Service: Orthopedics;  Laterality: Left;   SHOULDER ARTHROSCOPY WITH OPEN ROTATOR CUFF REPAIR Left 03/09/2017   Procedure: SHOULDER ARTHROSCOPY WITH OPEN ROTATOR CUFF REPAIR;  Surgeon: Cleotilde Barrio, MD;  Location: [facility] ORS;  Service: Orthopedics;  Laterality: Left;   SUBACROMIAL DECOMPRESSION Left 03/09/2017   Procedure: SUBACROMIAL DECOMPRESSION;  Surgeon: Cleotilde Barrio, MD;  Location: [facility] ORS;  Service: Orthopedics;  Laterality: Left;  BICEPS TENOTOMY    Family History  Problem Relation Age of Onset   Diabetes Mother        adult onset - geriatric   Alcohol abuse Father    Cancer Father        prostate   Stroke Father        mini strokes   Heart Problems Father    Other Maternal Aunt        born with her lung outside of her body; lived until 82.   Arthritis Maternal Grandmother     Social History   Tobacco Use   Smoking status: Never   Smokeless tobacco: Never  Substance Use  Topics   Alcohol use: Yes    Alcohol/week: 0.0 standard drinks of alcohol    Comment: a glass of wine/beer     Current Outpatient Medications:    acetaminophen  (TYLENOL ) 500 MG tablet, Take 500 mg by mouth daily as needed for moderate pain (pain score 4-6)., Disp: , Rfl:    aspirin  81 MG tablet, Take 81 mg by mouth daily., Disp: , Rfl:    brimonidine-timolol (COMBIGAN) 0.2-0.5 % ophthalmic solution, Apply 1 drop to eye., Disp: , Rfl:    cetirizine (ZYRTEC) 10 MG tablet, Take 10 mg by mouth daily., Disp: , Rfl:    Cholecalciferol (VITAMIN D ) 125 MCG (5000 UT) CAPS, Take 5,000 mcg by mouth daily., Disp: , Rfl:    cyanocobalamin  (VITAMIN B12) 1000 MCG tablet, Take 1,000 mcg by mouth daily., Disp: , Rfl:    dorzolamide (TRUSOPT) 2 % ophthalmic solution, 1 drop 3 (three) times daily., Disp: , Rfl:    dutasteride  (AVODART ) 0.5 MG capsule, Take 1 capsule (0.5 mg total) by mouth daily., Disp: 90 capsule, Rfl: 3   Evolocumab  (REPATHA  SURECLICK) 140 MG/ML SOAJ, Inject 140 mg into the skin every 14 (fourteen) days., Disp: 2 mL, Rfl: 11  fluticasone  (FLONASE ) 50 MCG/ACT nasal spray, Place 2 sprays into both nostrils daily., Disp: 48 mL, Rfl: 0   latanoprost (XALATAN) 0.005 % ophthalmic solution, Place 1 drop into both eyes at bedtime. , Disp: , Rfl:    losartan  (COZAAR ) 100 MG tablet, Take 1 tablet by mouth once daily, Disp: 90 tablet, Rfl: 3   metoprolol  succinate (TOPROL -XL) 50 MG 24 hr tablet, Take 1 tablet (50 mg total) by mouth daily., Disp: 90 tablet, Rfl: 3   potassium chloride (KLOR-CON M) 10 MEQ tablet, Take 10 mEq by mouth., Disp: , Rfl:    sildenafil  (REVATIO ) 20 MG tablet, Take 1 tablet (20 mg total) by mouth daily as needed., Disp: 90 tablet, Rfl: 3   Testosterone  Undecanoate (JATENZO ) 158 MG CAPS, Take 2 capsules (316 mg total) by mouth in the morning and at bedtime., Disp: 360 capsule, Rfl: 0   VITAMIN E PO, Take by mouth daily., Disp: , Rfl:    nitroGLYCERIN  (NITROSTAT ) 0.4 MG SL  tablet, Place 1 tablet (0.4 mg total) under the tongue every 5 (five) minutes as needed for chest pain., Disp: 25 tablet, Rfl: PRN  No Known Allergies  I personally reviewed active problem list, medication list, allergies, family history with the patient/caregiver today.   ROS  Ten systems reviewed and is negative except as mentioned in HPI    Objective Physical Exam  CONSTITUTIONAL: Patient appears well-developed and well-nourished. No distress. HEENT: Head atraumatic, normocephalic, neck supple. CARDIOVASCULAR: Normal rate, regular rhythm and normal heart sounds. No murmur heard. No BLE edema. PULMONARY: Effort normal and breath sounds normal. No respiratory distress. ABDOMINAL: There is no tenderness or distention. MUSCULOSKELETAL: Normal gait. Without gross motor or sensory deficit. Feet normal. Bunion present without pain. Callus formation below bunion, not on bunion. PSYCHIATRIC: Patient has a normal mood and affect. Behavior is normal. Judgment and thought content normal.  Vitals:   02/26/24 0834  BP: 124/80  Pulse: 66  Resp: 16  SpO2: 98%  Weight: 242 lb 4.8 oz (109.9 kg)  Height: 6' 3 (1.905 m)    Body mass index is 30.29 kg/m.  Recent Results (from the past 2160 hours)  Testosterone      Status: None   Collection Time: 02/01/24  9:03 AM  Result Value Ref Range   Testosterone  366 264 - 916 ng/dL    Comment: Adult male reference interval is based on a population of healthy nonobese males (BMI <30) between 58 and 4 years old. Travison, et.al. JCEM [phone]. PMID: 71675896.   Hemoglobin and hematocrit, blood     Status: Abnormal   Collection Time: 02/01/24  9:03 AM  Result Value Ref Range   Hemoglobin 16.3 13.0 - 17.7 g/dL   Hematocrit 48.8 (H) 62.4 - 51.0 %  POCT glycosylated hemoglobin (Hb A1C)     Status: Abnormal   Collection Time: 02/26/24  8:44 AM  Result Value Ref Range   Hemoglobin A1C 5.9 (A) 4.0 - 5.6 %   HbA1c POC (<> result, manual  entry)     HbA1c, POC (prediabetic range)     HbA1c, POC (controlled diabetic range)      Diabetic Foot Exam:  Diabetic foot exam was performed with the following findings:   Normal sensation of 10g monofilament Intact posterior tibialis and dorsalis pedis pulses Bunion, callus formation       PHQ2/9:    02/26/2024    8:26 AM 02/22/2024   10:25 AM 08/28/2023    8:51 AM 02/24/2023    9:10  AM 11/06/2022   10:37 AM  Depression screen PHQ 2/9  Decreased Interest 0 0 0 0 0  Down, Depressed, Hopeless 0 0 0 0 0  PHQ - 2 Score 0 0 0 0 0  Altered sleeping 0 0  0 0  Tired, decreased energy 0 0  0 0  Change in appetite 0 0  0 0  Feeling bad or failure about yourself  0 0  0 0  Trouble concentrating 0 0  0 0  Moving slowly or fidgety/restless 0 0  0 0  Suicidal thoughts 0 0  0 0  PHQ-9 Score 0 0  0  0   Difficult doing work/chores Not difficult at all Not difficult at all  Not difficult at all      Data saved with a previous flowsheet row definition    phq 9 is negative  Fall Risk:    02/26/2024    8:26 AM 02/22/2024   10:19 AM 08/28/2023    8:51 AM 02/24/2023    9:10 AM 11/06/2022   10:37 AM  Fall Risk   Falls in the past year? 0 0 0 0 0  Number falls in past yr: 0 0 0 0 0  Injury with Fall? 0 0  0  0  0   Risk for fall due to : No Fall Risks No Fall Risks No Fall Risks No Fall Risks No Fall Risks  Follow up Falls evaluation completed Falls evaluation completed;Falls prevention discussed Falls prevention discussed;Education provided;Falls evaluation completed Falls prevention discussed;Education provided;Falls evaluation completed Falls prevention discussed     Data saved with a previous flowsheet row definition    Assessment & Plan Type 2 diabetes mellitus with dyslipidemia and hypertension Diabetes well-controlled (A1c 5.9%). Dyslipidemia managed with Repatha . Hypertension controlled with losartan  and metoprolol  (BP 124/80 mmHg). - Continue Repatha  for dyslipidemia. -  Continue losartan  and metoprolol  for hypertension. - Continue lifestyle modifications for diabetes management.  Atherosclerotic heart disease and aortic atherosclerosis Managed with Repatha  and baby aspirin . - Continue Repatha  and baby aspirin .  Lumbar spinal stenosis with neurogenic claudication, sciatica, and degenerative arthritis of spine Chronic lumbar spinal stenosis with neurogenic claudication and sciatica causing significant pain. Previous medications not tolerated. Considering surgical intervention; advised second opinion due to potential multiple surgeries and uncertain outcomes. Pain management with injections provides temporary relief. - Continue Tylenol  500 mg four times a day. - Alternate Tylenol  and ibuprofen as needed, monitor kidney function. - Perform psoas muscle stretching exercises. - Seek second opinion for surgical intervention. - Attend pain management appointment on the 16th for potential injections.  Obstructive sleep apnea on CPAP therapy CPAP therapy continued, but AHI readings are very low and below the normal range. Patient continues to use CPAP due to snoring. - Continue CPAP therapy.  Benign prostatic hyperplasia with lower urinary tract symptoms Managed with Avodart . Symptoms not severe enough for surgery. - Continue Avodart .  Glaucoma, both eyes Managed with latanoprost, dorzolamide, and a third eye drop. Family history of glaucoma. Previous laser treatments effective. - Continue current eye drop regimen.  Erectile dysfunction Managed with sildenafil  as needed. - Continue sildenafil  as needed.

## 2024-03-15 ENCOUNTER — Other Ambulatory Visit: Payer: Self-pay | Admitting: *Deleted

## 2024-03-16 MED ORDER — JATENZO 158 MG PO CAPS: 316.0000 mg | ORAL_CAPSULE | Freq: Two times a day (BID) | ORAL | 0 refills | Status: AC

## 2024-03-24 ENCOUNTER — Other Ambulatory Visit: Payer: Self-pay | Admitting: Cardiovascular Disease

## 2024-04-06 ENCOUNTER — Other Ambulatory Visit: Payer: Self-pay | Admitting: Nurse Practitioner

## 2024-08-04 ENCOUNTER — Ambulatory Visit: Admitting: Urology

## 2024-08-26 ENCOUNTER — Ambulatory Visit: Admitting: Family Medicine
